# Patient Record
Sex: Female | Born: 1990 | Race: Black or African American | Hispanic: No | Smoking: Current some day smoker
Health system: Southern US, Community
[De-identification: ages and names within clinical notes are randomized; demographics above are authoritative.]

## PROBLEM LIST (undated history)

## (undated) DIAGNOSIS — J45909 Unspecified asthma, uncomplicated: Secondary | ICD-10-CM

## (undated) DIAGNOSIS — N12 Tubulo-interstitial nephritis, not specified as acute or chronic: Secondary | ICD-10-CM

---
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
## 2013-12-24 ENCOUNTER — Encounter (HOSPITAL_COMMUNITY): Payer: Self-pay | Admitting: Emergency Medicine

## 2013-12-24 ENCOUNTER — Inpatient Hospital Stay (HOSPITAL_COMMUNITY)
Admission: EM | Admit: 2013-12-24 | Discharge: 2013-12-29 | DRG: 872 | Disposition: A | Discharge status: Home / Self Care | Payer: BC Managed Care – PPO | Attending: Internal Medicine | Admitting: Internal Medicine

## 2013-12-24 ENCOUNTER — Emergency Department (HOSPITAL_COMMUNITY): Payer: BC Managed Care – PPO

## 2013-12-24 DIAGNOSIS — E876 Hypokalemia: Secondary | ICD-10-CM

## 2013-12-24 DIAGNOSIS — J45909 Unspecified asthma, uncomplicated: Secondary | ICD-10-CM

## 2013-12-24 DIAGNOSIS — A498 Other bacterial infections of unspecified site: Secondary | ICD-10-CM | POA: Diagnosis present

## 2013-12-24 DIAGNOSIS — K5900 Constipation, unspecified: Secondary | ICD-10-CM

## 2013-12-24 DIAGNOSIS — J8410 Pulmonary fibrosis, unspecified: Secondary | ICD-10-CM | POA: Diagnosis present

## 2013-12-24 DIAGNOSIS — R509 Fever, unspecified: Secondary | ICD-10-CM

## 2013-12-24 DIAGNOSIS — E8779 Other fluid overload: Secondary | ICD-10-CM | POA: Diagnosis present

## 2013-12-24 DIAGNOSIS — N12 Tubulo-interstitial nephritis, not specified as acute or chronic: Secondary | ICD-10-CM

## 2013-12-24 DIAGNOSIS — A419 Sepsis, unspecified organism: Principal | ICD-10-CM

## 2013-12-24 DIAGNOSIS — IMO0002 Reserved for concepts with insufficient information to code with codable children: Secondary | ICD-10-CM | POA: Diagnosis present

## 2013-12-24 DIAGNOSIS — Z79899 Other long term (current) drug therapy: Secondary | ICD-10-CM

## 2013-12-24 DIAGNOSIS — J8489 Other specified interstitial pulmonary diseases: Secondary | ICD-10-CM

## 2013-12-24 DIAGNOSIS — F17200 Nicotine dependence, unspecified, uncomplicated: Secondary | ICD-10-CM | POA: Diagnosis present

## 2013-12-24 HISTORY — DX: Unspecified asthma, uncomplicated: J45.909

## 2013-12-24 LAB — CBC WITH DIFFERENTIAL/PLATELET
BASOS ABS: 0 10*3/uL (ref 0.0–0.1)
Basophils Relative: 0 % (ref 0–1)
EOS PCT: 0 % (ref 0–5)
Eosinophils Absolute: 0 10*3/uL (ref 0.0–0.7)
HCT: 38.1 % (ref 36.0–46.0)
Hemoglobin: 12.3 g/dL (ref 12.0–15.0)
Lymphocytes Relative: 5 % — ABNORMAL LOW (ref 12–46)
Lymphs Abs: 0.6 10*3/uL — ABNORMAL LOW (ref 0.7–4.0)
MCH: 28.5 pg (ref 26.0–34.0)
MCHC: 32.3 g/dL (ref 30.0–36.0)
MCV: 88.4 fL (ref 78.0–100.0)
Monocytes Absolute: 1.6 10*3/uL — ABNORMAL HIGH (ref 0.1–1.0)
Monocytes Relative: 13 % — ABNORMAL HIGH (ref 3–12)
Neutro Abs: 10.1 10*3/uL — ABNORMAL HIGH (ref 1.7–7.7)
Neutrophils Relative %: 82 % — ABNORMAL HIGH (ref 43–77)
PLATELETS: 288 10*3/uL (ref 150–400)
RBC: 4.31 MIL/uL (ref 3.87–5.11)
RDW: 14 % (ref 11.5–15.5)
WBC: 12.4 10*3/uL — AB (ref 4.0–10.5)

## 2013-12-24 LAB — URINALYSIS, ROUTINE W REFLEX MICROSCOPIC
Bilirubin Urine: NEGATIVE
Glucose, UA: NEGATIVE mg/dL
Ketones, ur: NEGATIVE mg/dL
Nitrite: POSITIVE — AB
Protein, ur: 100 mg/dL — AB
SPECIFIC GRAVITY, URINE: 1.021 (ref 1.005–1.030)
UROBILINOGEN UA: 1 mg/dL (ref 0.0–1.0)
pH: 6 (ref 5.0–8.0)

## 2013-12-24 LAB — COMPREHENSIVE METABOLIC PANEL
ALT: 15 U/L (ref 0–35)
AST: 17 U/L (ref 0–37)
Albumin: 3.2 g/dL — ABNORMAL LOW (ref 3.5–5.2)
Alkaline Phosphatase: 81 U/L (ref 39–117)
BUN: 11 mg/dL (ref 6–23)
CALCIUM: 8.6 mg/dL (ref 8.4–10.5)
CO2: 24 mEq/L (ref 19–32)
Chloride: 101 mEq/L (ref 96–112)
Creatinine, Ser: 0.99 mg/dL (ref 0.50–1.10)
GFR calc Af Amer: >90 mL/min (ref >90)
GFR calc non Af Amer: 80 mL/min — ABNORMAL LOW (ref >90)
Glucose, Bld: 120 mg/dL — ABNORMAL HIGH (ref 70–99)
Potassium: 3.9 mEq/L (ref 3.7–5.3)
SODIUM: 139 meq/L (ref 137–147)
TOTAL PROTEIN: 7.3 g/dL (ref 6.0–8.3)
Total Bilirubin: 1 mg/dL (ref 0.3–1.2)

## 2013-12-24 LAB — LIPASE, BLOOD: Lipase: 14 U/L (ref 11–59)

## 2013-12-24 LAB — URINE MICROSCOPIC-ADD ON

## 2013-12-24 LAB — WET PREP, GENITAL
Clue Cells Wet Prep HPF POC: NONE SEEN
Trich, Wet Prep: NONE SEEN
WBC WET PREP: NONE SEEN
YEAST WET PREP: NONE SEEN

## 2013-12-24 LAB — POCT PREGNANCY, URINE: Preg Test, Ur: NEGATIVE

## 2013-12-24 LAB — CG4 I-STAT (LACTIC ACID): Lactic Acid, Venous: 1.34 mmol/L (ref 0.5–2.2)

## 2013-12-24 MED ORDER — SODIUM CHLORIDE 0.9 % IJ SOLN
3.0000 mL | Freq: Two times a day (BID) | INTRAMUSCULAR | Status: DC
  Administered 2013-12-24 – 2013-12-29 (×3): 3 mL via INTRAVENOUS

## 2013-12-24 MED ORDER — ONDANSETRON HCL 4 MG PO TABS: 4.0000 mg | ORAL_TABLET | Freq: Four times a day (QID) | ORAL | Status: DC | PRN

## 2013-12-24 MED ORDER — SODIUM CHLORIDE 0.9 % IV SOLN
INTRAVENOUS | Status: DC
  Administered 2013-12-24 – 2013-12-28 (×4): via INTRAVENOUS

## 2013-12-24 MED ORDER — OSELTAMIVIR PHOSPHATE 75 MG PO CAPS
75.0000 mg | ORAL_CAPSULE | Freq: Two times a day (BID) | ORAL | Status: DC
  Administered 2013-12-24 – 2013-12-25 (×2): 75 mg via ORAL
  Filled 2013-12-24 (×3): qty 1

## 2013-12-24 MED ORDER — ACETAMINOPHEN 325 MG PO TABS
650.0000 mg | ORAL_TABLET | Freq: Once | ORAL | Status: AC
  Administered 2013-12-24: 650 mg via ORAL
  Filled 2013-12-24: qty 2

## 2013-12-24 MED ORDER — ENOXAPARIN SODIUM 80 MG/0.8ML ~~LOC~~ SOLN
75.0000 mg | Freq: Every day | SUBCUTANEOUS | Status: DC
  Administered 2013-12-24: 75 mg via SUBCUTANEOUS
  Filled 2013-12-24 (×3): qty 0.8

## 2013-12-24 MED ORDER — IOHEXOL 300 MG/ML  SOLN
100.0000 mL | Freq: Once | INTRAMUSCULAR | Status: AC | PRN
Start: 2013-12-24 — End: 2013-12-24
  Administered 2013-12-24: 100 mL via INTRAVENOUS

## 2013-12-24 MED ORDER — MORPHINE SULFATE 4 MG/ML IJ SOLN
4.0000 mg | Freq: Once | INTRAMUSCULAR | Status: AC
  Administered 2013-12-24: 4 mg via INTRAVENOUS
  Filled 2013-12-24: qty 1

## 2013-12-24 MED ORDER — CEFTRIAXONE SODIUM 1 G IJ SOLR
1.0000 g | INTRAMUSCULAR | Status: DC
  Filled 2013-12-24: qty 10

## 2013-12-24 MED ORDER — SODIUM CHLORIDE 0.9 % IV BOLUS (SEPSIS)
1000.0000 mL | Freq: Once | INTRAVENOUS | Status: AC
  Administered 2013-12-24: 1000 mL via INTRAVENOUS

## 2013-12-24 MED ORDER — IBUPROFEN 800 MG PO TABS
800.0000 mg | ORAL_TABLET | Freq: Once | ORAL | Status: AC
  Administered 2013-12-24: 800 mg via ORAL
  Filled 2013-12-24: qty 1

## 2013-12-24 MED ORDER — IPRATROPIUM-ALBUTEROL 0.5-2.5 (3) MG/3ML IN SOLN
3.0000 mL | Freq: Once | RESPIRATORY_TRACT | Status: AC
  Administered 2013-12-24: 3 mL via RESPIRATORY_TRACT
  Filled 2013-12-24: qty 3

## 2013-12-24 MED ORDER — DEXTROSE 5 % IV SOLN
1.0000 g | Freq: Once | INTRAVENOUS | Status: AC
  Administered 2013-12-24: 1 g via INTRAVENOUS
  Filled 2013-12-24: qty 10

## 2013-12-24 MED ORDER — ALBUTEROL SULFATE (2.5 MG/3ML) 0.083% IN NEBU
2.5000 mg | INHALATION_SOLUTION | RESPIRATORY_TRACT | Status: DC | PRN
  Administered 2013-12-24: 2.5 mg via RESPIRATORY_TRACT
  Filled 2013-12-24: qty 3

## 2013-12-24 MED ORDER — OXYCODONE HCL 5 MG PO TABS
5.0000 mg | ORAL_TABLET | ORAL | Status: DC | PRN
  Administered 2013-12-26 – 2013-12-28 (×3): 5 mg via ORAL
  Filled 2013-12-24 (×3): qty 1

## 2013-12-24 MED ORDER — IOHEXOL 300 MG/ML  SOLN
25.0000 mL | Freq: Once | INTRAMUSCULAR | Status: AC | PRN
  Administered 2013-12-24: 25 mL via ORAL

## 2013-12-24 MED ORDER — ONDANSETRON HCL 4 MG/2ML IJ SOLN
4.0000 mg | Freq: Once | INTRAMUSCULAR | Status: AC
  Administered 2013-12-24: 4 mg via INTRAVENOUS
  Filled 2013-12-24: qty 2

## 2013-12-24 MED ORDER — ONDANSETRON HCL 4 MG/2ML IJ SOLN
4.0000 mg | Freq: Four times a day (QID) | INTRAMUSCULAR | Status: DC | PRN
  Administered 2013-12-24 – 2013-12-27 (×6): 4 mg via INTRAVENOUS
  Filled 2013-12-24 (×6): qty 2

## 2013-12-24 NOTE — H&P (Signed)
Date: 12/24/2013               Patient Name:  Natalie Ramsey MRN: [id number]  DOB: 08/19/1991 Age / Sex: 23 y.o., female   PCP: No primary provider on file.         Medical Service: Internal Medicine Teaching Service         Attending Physician: Dr. Jonah Blue, DO    First Contact: Daivd Council, MS IV Pager: [phone]  Second Contact: Dr. Desma Maxim, MD Pager: [phone]       After Hours (After 5p/  First Contact Pager: [phone]  weekends / holidays): Second Contact Pager: [phone]   Chief Complaint: Flank Pain  History of Present Illness: Natalie Ramsey is a 23 y.o. woman with pmhx of asthma who present with a cc of right sided flank pain. The patient was in her normal state of health until 4 days ago when she developed right sided flank pain. This pain is sharp. It does not radiate. She has associated symptoms for fevers, chills, nausea and vomiting since four days ago. She has not been able to keep food down during the last 3 days but has been able to drink water without problems. Today she began to feel woozy and dizzy while walking. The patient denies changes in bowel habits. She admits to burning with urination that has been present for years and has been unchanged recently. She denies vaginal discharge or abnormal bleeding. Her LMP was just before christmas. Her periods are sometimes irregular. She has had 1 sexual partner over the last 6 months. She has had 7 total sexual partners in her life. She reports that she is a lesbian and does not use contraception or birth control at this times. She admits to mild dyspareunia.    Of note, the patient developed SOB and cough over the last 24 hours. Her cough is productive of clear sputum. She denies sick contacts and body aches.   Meds: No current facility-administered medications for this encounter.   Current Outpatient Prescriptions  Medication Sig Dispense Refill  . albuterol (PROVENTIL HFA;VENTOLIN HFA) 108 (90 BASE) MCG/ACT  inhaler Inhale into the lungs every 6 (six) hours as needed for wheezing or shortness of breath.      . Pseudoeph-Doxylamine-DM-APAP (NYQUIL PO) Take 2 capsules by mouth every 4 (four) hours as needed.        Allergies: Allergies as of 12/24/2013  . (No Known Allergies)   Past Medical History  Diagnosis Date  . Asthma    History reviewed. No pertinent past surgical history. History reviewed. No pertinent family history. History   Social History  . Marital Status: Single    Spouse Name: N/A    Number of Children: N/A  . Years of Education: N/A   Occupational History  . Not on file.   Social History Main Topics  . Smoking status: Current Some Day Smoker  . Smokeless tobacco: Not on file  . Alcohol Use: No  . Drug Use: No  . Sexual Activity: Not on file   Other Topics Concern  . Not on file   Social History Narrative  . No narrative on file    Review of Systems: Pertinent items are noted in HPI.  Physical Exam: Blood pressure 120/63, pulse 68, temperature 98.9 F (37.2 C), temperature source Oral, resp. rate 20, last menstrual period 11/17/2013, SpO2 100.00%. Physical Exam  Constitutional: She is oriented to person, place, and time. She appears well-developed and well-nourished.  No distress.  HENT:  Head: Normocephalic.  Mouth/Throat: Oropharynx is clear and moist. No oropharyngeal exudate.  Cardiovascular: Normal rate, regular rhythm and intact distal pulses.  Exam reveals no gallop and no friction rub.   Murmur heard. Systolic crescendo decrescendo murmur 2/6 heard best in the LUSB.  Pulmonary/Chest: Effort normal. No respiratory distress. She has wheezes. She has no rales. She exhibits no tenderness.  Abdominal: Soft. Bowel sounds are normal. She exhibits no distension. There is tenderness. There is no rebound and no guarding.  Mild tenderness in right flank and RLQ  Genitourinary:  Pelvic performed by ED team.  Musculoskeletal: She exhibits no edema and no  tenderness.  Mild Right sided CVA tenderness.  Neurological: She is alert and oriented to person, place, and time.  Skin: She is not diaphoretic.  Psychiatric: She has a normal mood and affect. Her behavior is normal.     Lab results: Basic Metabolic Panel:  Recent Labs  47/82/9501/27/15 1200  NA 139  K 3.9  CL 101  CO2 24  GLUCOSE 120*  BUN 11  CREATININE 0.99  CALCIUM 8.6   AG: 14  Lactic Acid: 1.34  Liver Function Tests:  Recent Labs  12/24/13 1200  AST 17  ALT 15  ALKPHOS 81  BILITOT 1.0  PROT 7.3  ALBUMIN 3.2*    Recent Labs  12/24/13 1200  LIPASE 14   CBC:  Recent Labs  12/24/13 1200  WBC 12.4*  NEUTROABS 10.1*  HGB 12.3  HCT 38.1  MCV 88.4  PLT 288   Urinalysis:  Recent Labs  12/24/13 1510  COLORURINE AMBER*  LABSPEC 1.021  PHURINE 6.0  GLUCOSEU NEGATIVE  HGBUR LARGE*  BILIRUBINUR NEGATIVE  KETONESUR NEGATIVE  PROTEINUR 100*  UROBILINOGEN 1.0  NITRITE POSITIVE*  LEUKOCYTESUR MODERATE*   Other labs: Wet Prep: Negative GC: pending  Imaging results:  [place] Transvaginal Non-ob  12/24/2013   CLINICAL DATA:  Fever and right lower quadrant discomfort, possible tubo-ovarian abscess.  EXAM: TRANSABDOMINAL AND TRANSVAGINAL ULTRASOUND OF PELVIS  TECHNIQUE: Both transabdominal and transvaginal ultrasound examinations of the pelvis were performed. Transabdominal technique was performed for global imaging of the pelvis including uterus, ovaries, adnexal regions, and pelvic cul-de-sac. It was necessary to proceed with endovaginal exam following the transabdominal exam to visualize the endometrium and adnexal structures.  COMPARISON:  None  FINDINGS: Uterus  Measurements: 6.7 x 3.2 x 4.3 cm. No fibroids or other mass visualized.  Endometrium  Thickness: 4.2 mm.  No focal abnormality visualized.  Right ovary  Measurements: 3.8 x 2.6 x 1.8 cm. Normal appearance/no adnexal mass.  Left ovary  Measurements: 2.7 x 1.7 x 1.5 cm. Normal appearance/no adnexal mass.   Other findings  There is no free fluid or complex fluid collection in the pelvis. There is echogenic material within the urinary bladder.  IMPRESSION: 1. The uterus is normal in appearance. 2. The ovaries and adnexal structures exhibit no acute abnormalities. 3. Echogenic debris within the urinary bladder may be sterile cellular debris or could reflect cystitis or blood products. 4. If there are clinical concerns of possible acute appendicitis, CT scanning would be a useful next imaging step.   Electronically Signed   By: David  [place]   On: 12/24/2013 16:05   [place] Pelvis Complete  12/24/2013   CLINICAL DATA:  Fever and right lower quadrant discomfort, possible tubo-ovarian abscess.  EXAM: TRANSABDOMINAL AND TRANSVAGINAL ULTRASOUND OF PELVIS  TECHNIQUE: Both transabdominal and transvaginal ultrasound examinations of the pelvis were performed. Transabdominal technique  was performed for global imaging of the pelvis including uterus, ovaries, adnexal regions, and pelvic cul-de-sac. It was necessary to proceed with endovaginal exam following the transabdominal exam to visualize the endometrium and adnexal structures.  COMPARISON:  None  FINDINGS: Uterus  Measurements: 6.7 x 3.2 x 4.3 cm. No fibroids or other mass visualized.  Endometrium  Thickness: 4.2 mm.  No focal abnormality visualized.  Right ovary  Measurements: 3.8 x 2.6 x 1.8 cm. Normal appearance/no adnexal mass.  Left ovary  Measurements: 2.7 x 1.7 x 1.5 cm. Normal appearance/no adnexal mass.  Other findings  There is no free fluid or complex fluid collection in the pelvis. There is echogenic material within the urinary bladder.  IMPRESSION: 1. The uterus is normal in appearance. 2. The ovaries and adnexal structures exhibit no acute abnormalities. 3. Echogenic debris within the urinary bladder may be sterile cellular debris or could reflect cystitis or blood products. 4. If there are clinical concerns of possible acute appendicitis, CT scanning would be a  useful next imaging step.   Electronically Signed   By: David  [place]   On: 12/24/2013 16:05   Ct Abdomen Pelvis W Contrast  12/24/2013   CLINICAL DATA:  Right lower quadrant pain with nausea and vomiting for 4 days.  EXAM: CT ABDOMEN AND PELVIS WITH CONTRAST  TECHNIQUE: Multidetector CT imaging of the abdomen and pelvis was performed using the standard protocol following bolus administration of intravenous contrast.  CONTRAST:  OMNIPAQUE IOHEXOL 300 MG/ML  SOLN  COMPARISON:  None.  FINDINGS: Lung Bases: Dependent atelectasis is present at at both lung bases.  Liver:  Normal.  Spleen:  Normal.  Gallbladder:  Normal.  Common bile duct:  Normal.  Pancreas:  Normal.  Adrenal glands:  Normal.  Kidneys: The left kidney and ureter appear normal. The right kidney demonstrates decreased enhancement in the lower pole band upper to interpolar region compatible with pyelonephritis. Correlation with urinalysis is recommended. This is best appreciated on the sagittal and coronal reconstructed images.  Stomach:  Normal.  Small bowel:  Normal.  Colon:   Normal appendix.  No inflammatory changes of colon.  Pelvic Genitourinary: Normal appearance of the uterus and adnexa. Urinary bladder is within normal limits.  Bones:  Normal.  Vasculature: Normal.  Body Wall: Normal.  IMPRESSION: Right pyelonephritis.   Electronically Signed   By: Andreas Newport M.D.   On: 12/24/2013 18:57   Dg Chest Port 1 View  (if Code Sepsis Called)  12/24/2013   CLINICAL DATA:  Fever.  EXAM: PORTABLE CHEST - 1 VIEW  COMPARISON:  09/10/2007.  FINDINGS: Stable cardiac and mediastinal contours, enlarged, given differences in technique. Low lung volumes. No consolidative pulmonary opacities. Pulmonary vascular redistribution. No pleural effusion pneumothorax.  IMPRESSION: Mild cardiomegaly.  Pulmonary vascular redistribution.   Electronically Signed   By: Annia Belt M.D.   On: 12/24/2013 12:12    Other results: EKG: not available  Assessment  & Plan by Problem: Principal Problem:   Sepsis Active Problems:   Pyelonephritis   Asthma, chronic  Sepsis 2/2 Pyelonephritis The patients flank pain complicated by /V is likely due to pyelonephritis complicated by sepsis. SIRS criteria are  fever, WBC, tachycardia, tachypnea). PID is unlikely given no cervical motion tenderness. Appendicitis unlikely given normal appendix on CT. The patients BP responded well to 2 L NS bolus in ED.   - Blood Cultures and urine culture pending - Ceftriaxone IV started - Zofran 4 mg IV q 6 prn -  IVFs at 75 cc/hr - F/U GC/Chlamydia, start azithro if positive  Cough The patients cough is likely due to asthma as she has history of asthma and there is wheezing bil on physical exam. However, flu is possible given fever and productive cough. PNA less likely given clear CXR. CXR revealed mild cardiomegaly (may be due to AP portable technique) and a heart murmur was appreciated. Thus, echocardiogram may be considered.  - F/U EKG - F/U Repeat CXR, may consider TTE in am if cardiomegaly on 2 view with PA and heart murmur is persistant. - F/U flu swab and start tamiflu if positive - Duonebs q 6 hr prn   DVT: Lovenox Diet: Regular  Dispo: Disposition is deferred at this time, awaiting improvement of current medical problems. Anticipated discharge in approximately 1-2 day(s).   The patient does not have a current PCP (No primary provider on file.) and does need an [facility] hospital follow-up appointment after discharge.  The patient does not have transportation limitations that hinder transportation to clinic appointments.  Signed: Pleas Koch, MD 12/24/2013, 9:34 PM

## 2013-12-24 NOTE — ED Notes (Signed)
Internal medicine at bedside

## 2013-12-24 NOTE — ED Notes (Signed)
Spoke with Selena BattenKim in main lab, states she will add lipase onto blood work

## 2013-12-24 NOTE — ED Notes (Signed)
Pt c/o fever, chills, nausea, vomiting, abd cramping, constipation, and unable to eat or keep food down x4 days

## 2013-12-24 NOTE — ED Notes (Signed)
Pt finished drinking oral CT contrast. Will notify CT.

## 2013-12-24 NOTE — ED Notes (Signed)
Called Ct

## 2013-12-24 NOTE — Progress Notes (Signed)
ANTIBIOTIC CONSULT NOTE - INITIAL  Pharmacy Consult for ceftriaxone Indication: pyelonephritis  No Known Allergies  Patient Measurements: Height: 5\' 11"  (180.3 cm) Weight: 324 lb 1.2 oz (147 kg) IBW/kg (Calculated) : 70.8 Adjusted Body Weight:   Vital Signs: Temp: 100.1 F (37.8 C) (01/27 2220) Temp src: Oral (01/27 2220) BP: 132/56 mmHg (01/27 2220) Pulse Rate: 71 (01/27 2220) Intake/Output from previous day:   Intake/Output from this shift:    Labs:  Recent Labs  12/24/13 1200  WBC 12.4*  HGB 12.3  PLT 288  CREATININE 0.99   Estimated Creatinine Clearance: 141.3 ml/min (by C-G formula based on Cr of 0.99). No results found for this basename: VANCOTROUGH, VANCOPEAK, VANCORANDOM, GENTTROUGH, GENTPEAK, GENTRANDOM, TOBRATROUGH, TOBRAPEAK, TOBRARND, AMIKACINPEAK, AMIKACINTROU, AMIKACIN,  in the last 72 hours   Microbiology: Recent Results (from the past 720 hour(s))  WET PREP, GENITAL     Status: None   Collection Time    12/24/13  3:08 PM      Result Value Range Status   Yeast Wet Prep HPF POC NONE SEEN  NONE SEEN Final   Trich, Wet Prep NONE SEEN  NONE SEEN Final   Clue Cells Wet Prep HPF POC NONE SEEN  NONE SEEN Final   WBC, Wet Prep HPF POC NONE SEEN  NONE SEEN Final    Medical History: Past Medical History  Diagnosis Date  . Asthma     Medications:  Scheduled:  . enoxaparin (LOVENOX) injection  75 mg Subcutaneous QHS  . oseltamivir  75 mg Oral BID  . sodium chloride  3 mL Intravenous Q12H   Infusions:  . sodium chloride     Assessment: 23 yo female with pyelonephritis will be continued on ceftriaxone.  Patient received 1g of ceftriaxone at 1809 on 12/24/13  Goal of Therapy:  Resolution of infection  Plan:  1) Continue ceftriaxone 1g iv q24h, next dose at 1800 on 12/25/13.  Pharmacy will sign off.  Ammar Moffatt, Tsz-Yin 12/24/2013,10:24 PM

## 2013-12-24 NOTE — ED Provider Notes (Signed)
CSN: [id number]     Arrival date & time 12/24/13  1058 History   First MD Initiated Contact with Patient 12/24/13 1150     Chief Complaint  Patient presents with  . Fever    HPI  Natalie Ramsey is a 23 y.o. female with a PMH of asthma who presents to the ED for evaluation of fever.  History was provided by the patient.  Patient has had abdominal pain for the past 4 days.  Her pain is located in the RLQ without radiation.  Her pain is constant and worse with movement. Her pain is described as an aching and sharp sensation. She also reports chills, fever, generalized headache, lightheadedness, nausea, vomiting x 4 days (two times today), constipation (last BM 4 days ago), fatigue, foul urine odor, diffuse lower back pain, and generalized weakness.  She has had a productive cough with no hemoptysis.  She has a hx of asthma and is almost out of her inhaler.  She has had mild wheezing with no SOB or chest pain.  She is currently sexually active with no new partners.  No recent travel.  No known sick contacts.  Patient did not receive the flu shot this year.  She denies any rhinorrhea, sore throat, ear pain, diarrhea, leg edema.     Past Medical History  Diagnosis Date  . Asthma    History reviewed. No pertinent past surgical history. History reviewed. No pertinent family history. History  Substance Use Topics  . Smoking status: Current Some Day Smoker  . Smokeless tobacco: Not on file  . Alcohol Use: No   OB History   Grav Para Term Preterm Abortions TAB SAB Ect Mult Living                 Review of Systems  Constitutional: Positive for fever, chills, diaphoresis, activity change, appetite change and fatigue.  HENT: Negative for congestion, rhinorrhea and sore throat.   Eyes: Negative for photophobia and visual disturbance.  Respiratory: Positive for cough and wheezing. Negative for chest tightness and shortness of breath.   Cardiovascular: Negative for chest pain and leg swelling.   Gastrointestinal: Positive for nausea, vomiting, abdominal pain and constipation. Negative for diarrhea and blood in stool.  Genitourinary: Negative for dysuria, hematuria, flank pain, decreased urine volume, vaginal bleeding, vaginal discharge, difficulty urinating and vaginal pain.  Musculoskeletal: Positive for back pain and myalgias. Negative for gait problem and neck pain.  Skin: Negative for rash and wound.  Neurological: Positive for weakness (generalized), light-headedness and headaches. Negative for dizziness, syncope and numbness.    Allergies  Review of patient's allergies indicates no known allergies.  Home Medications   Current Outpatient Rx  Name  Route  Sig  Dispense  Refill  . albuterol (PROVENTIL HFA;VENTOLIN HFA) 108 (90 BASE) MCG/ACT inhaler   Inhalation   Inhale into the lungs every 6 (six) hours as needed for wheezing or shortness of breath.         . Pseudoeph-Doxylamine-DM-APAP (NYQUIL PO)   Oral   Take 2 capsules by mouth every 4 (four) hours as needed.          BP 93/59  Pulse 104  Temp(Src) 103.1 F (39.5 C) (Oral)  Resp 20  SpO2 98%  LMP 11/17/2013  Filed Vitals:   12/24/13 1345 12/24/13 1430 12/24/13 1445 12/24/13 1635  BP: 117/64 127/68 129/63 146/75  Pulse: 93 81 84 97  Temp:  101.3 F (38.5 C)  103.2 F (39.6 C)  TempSrc:  Oral  Oral  Resp:  18  19  SpO2: 96% 100% 97% 100%   Physical Exam  Nursing note and vitals reviewed. Constitutional: She is oriented to person, place, and time. She appears well-developed and well-nourished. No distress.  HENT:  Head: Normocephalic and atraumatic.  Right Ear: External ear normal.  Left Ear: External ear normal.  Nose: Nose normal.  Mouth/Throat: Oropharynx is clear and moist. No oropharyngeal exudate.  No erythema to the posterior pharynx.  Uvula midline.  No trismus.  Tympanic membranes gray and translucent bilaterally with no erythema, edema, or hemotympanum.    Eyes: Conjunctivae and EOM  are normal. Pupils are equal, round, and reactive to light. Right eye exhibits no discharge. Left eye exhibits no discharge.  Neck: Normal range of motion. Neck supple.  No cervical spinal or paraspinal tenderness to palpation throughout.  No limitations with neck ROM.    Cardiovascular: Normal rate, regular rhythm, normal heart sounds and intact distal pulses.  Exam reveals no gallop and no friction rub.   No murmur heard. Pulmonary/Chest: Effort normal. No respiratory distress. She has wheezes. She has no rales. She exhibits no tenderness.  Mild intermittent inspiratory and expiratory wheezing  Abdominal: Soft. Bowel sounds are normal. She exhibits no distension and no mass. There is tenderness. There is no rebound and no guarding.  RLQ tenderness.  Negative Rovsing's.      Musculoskeletal: Normal range of motion. She exhibits no edema and no tenderness.  No pedal edema or calf tenderness bilaterally.  Patient able to ambulate without difficulty or ataxia  Neurological: She is alert and oriented to person, place, and time.  Skin: Skin is warm and dry. No rash noted. She is not diaphoretic.  Warm to the touch    ED Course  Procedures (including critical care time) Labs Review Labs Reviewed  CBC WITH DIFFERENTIAL - Abnormal; Notable for the following:    WBC 12.4 (*)    Neutrophils Relative % 82 (*)    Neutro Abs 10.1 (*)    Lymphocytes Relative 5 (*)    Lymphs Abs 0.6 (*)    Monocytes Relative 13 (*)    Monocytes Absolute 1.6 (*)    All other components within normal limits  PREGNANCY, URINE  COMPREHENSIVE METABOLIC PANEL  CG4 I-STAT (LACTIC ACID)   Imaging Review Dg Chest Port 1 View  (if Code Sepsis Called)  12/24/2013   CLINICAL DATA:  Fever.  EXAM: PORTABLE CHEST - 1 VIEW  COMPARISON:  09/10/2007.  FINDINGS: Stable cardiac and mediastinal contours, enlarged, given differences in technique. Low lung volumes. No consolidative pulmonary opacities. Pulmonary vascular  redistribution. No pleural effusion pneumothorax.  IMPRESSION: Mild cardiomegaly.  Pulmonary vascular redistribution.   Electronically Signed   By: Annia Belt M.D.   On: 12/24/2013 12:12    EKG Interpretation   None      Results for orders placed during the hospital encounter of 12/24/13  WET PREP, GENITAL      Result Value Range   Yeast Wet Prep HPF POC NONE SEEN  NONE SEEN   Trich, Wet Prep NONE SEEN  NONE SEEN   Clue Cells Wet Prep HPF POC NONE SEEN  NONE SEEN   WBC, Wet Prep HPF POC NONE SEEN  NONE SEEN  CBC WITH DIFFERENTIAL      Result Value Range   WBC 12.4 (*) 4.0 - 10.5 K/uL   RBC 4.31  3.87 - 5.11 MIL/uL   Hemoglobin 12.3  12.0 -  15.0 g/dL   HCT 45.4  09.8 - 11.9 %   MCV 88.4  78.0 - 100.0 fL   MCH 28.5  26.0 - 34.0 pg   MCHC 32.3  30.0 - 36.0 g/dL   RDW 14.7  82.9 - 56.2 %   Platelets 288  150 - 400 K/uL   Neutrophils Relative % 82 (*) 43 - 77 %   Neutro Abs 10.1 (*) 1.7 - 7.7 K/uL   Lymphocytes Relative 5 (*) 12 - 46 %   Lymphs Abs 0.6 (*) 0.7 - 4.0 K/uL   Monocytes Relative 13 (*) 3 - 12 %   Monocytes Absolute 1.6 (*) 0.1 - 1.0 K/uL   Eosinophils Relative 0  0 - 5 %   Eosinophils Absolute 0.0  0.0 - 0.7 K/uL   Basophils Relative 0  0 - 1 %   Basophils Absolute 0.0  0.0 - 0.1 K/uL  COMPREHENSIVE METABOLIC PANEL      Result Value Range   Sodium 139  137 - 147 mEq/L   Potassium 3.9  3.7 - 5.3 mEq/L   Chloride 101  96 - 112 mEq/L   CO2 24  19 - 32 mEq/L   Glucose, Bld 120 (*) 70 - 99 mg/dL   BUN 11  6 - 23 mg/dL   Creatinine, Ser 1.30  0.50 - 1.10 mg/dL   Calcium 8.6  8.4 - 86.5 mg/dL   Total Protein 7.3  6.0 - 8.3 g/dL   Albumin 3.2 (*) 3.5 - 5.2 g/dL   AST 17  0 - 37 U/L   ALT 15  0 - 35 U/L   Alkaline Phosphatase 81  39 - 117 U/L   Total Bilirubin 1.0  0.3 - 1.2 mg/dL   GFR calc non Af Amer 80 (*) >90 mL/min   GFR calc Af Amer >90  >90 mL/min  LIPASE, BLOOD      Result Value Range   Lipase 14  11 - 59 U/L  URINALYSIS, ROUTINE W REFLEX MICROSCOPIC       Result Value Range   Color, Urine AMBER (*) YELLOW   APPearance CLOUDY (*) CLEAR   Specific Gravity, Urine 1.021  1.005 - 1.030   pH 6.0  5.0 - 8.0   Glucose, UA NEGATIVE  NEGATIVE mg/dL   Hgb urine dipstick LARGE (*) NEGATIVE   Bilirubin Urine NEGATIVE  NEGATIVE   Ketones, ur NEGATIVE  NEGATIVE mg/dL   Protein, ur 784 (*) NEGATIVE mg/dL   Urobilinogen, UA 1.0  0.0 - 1.0 mg/dL   Nitrite POSITIVE (*) NEGATIVE   Leukocytes, UA MODERATE (*) NEGATIVE  URINE MICROSCOPIC-ADD ON      Result Value Range   Squamous Epithelial / LPF FEW (*) RARE   WBC, UA 21-50  <3 WBC/hpf   RBC / HPF 0-2  <3 RBC/hpf   Bacteria, UA MANY (*) RARE  CG4 I-STAT (LACTIC ACID)      Result Value Range   Lactic Acid, Venous 1.34  0.5 - 2.2 mmol/L  POCT PREGNANCY, URINE      Result Value Range   Preg Test, Ur NEGATIVE  NEGATIVE    DG Chest Port 1 View (if Code Sepsis called) (Final result)  Result time: 12/24/13 12:12:41    Final result by Rad Results In Interface (12/24/13 12:12:41)    Narrative:   CLINICAL DATA: Fever.  EXAM: PORTABLE CHEST - 1 VIEW  COMPARISON: 09/10/2007.  FINDINGS: Stable cardiac and mediastinal contours, enlarged, given differences in technique.  Low lung volumes. No consolidative pulmonary opacities. Pulmonary vascular redistribution. No pleural effusion pneumothorax.  IMPRESSION: Mild cardiomegaly. Pulmonary vascular redistribution.   Electronically Signed By: Annia Belt M.D. On: 12/24/2013 12:12             US Transvaginal Non-OB (Final result)  Result time: 12/24/13 16:05:10    Final result by Rad Results In Interface (12/24/13 16:05:10)    Narrative:   CLINICAL DATA: Fever and right lower quadrant discomfort, possible tubo-ovarian abscess.  EXAM: TRANSABDOMINAL AND TRANSVAGINAL ULTRASOUND OF PELVIS  TECHNIQUE: Both transabdominal and transvaginal ultrasound examinations of the pelvis were performed. Transabdominal technique was performed for global  imaging of the pelvis including uterus, ovaries, adnexal regions, and pelvic cul-de-sac. It was necessary to proceed with endovaginal exam following the transabdominal exam to visualize the endometrium and adnexal structures.  COMPARISON: None  FINDINGS: Uterus  Measurements: 6.7 x 3.2 x 4.3 cm. No fibroids or other mass visualized.  Endometrium  Thickness: 4.2 mm. No focal abnormality visualized.  Right ovary  Measurements: 3.8 x 2.6 x 1.8 cm. Normal appearance/no adnexal mass.  Left ovary  Measurements: 2.7 x 1.7 x 1.5 cm. Normal appearance/no adnexal mass.  Other findings  There is no free fluid or complex fluid collection in the pelvis. There is echogenic material within the urinary bladder.  IMPRESSION: 1. The uterus is normal in appearance. 2. The ovaries and adnexal structures exhibit no acute abnormalities. 3. Echogenic debris within the urinary bladder may be sterile cellular debris or could reflect cystitis or blood products. 4. If there are clinical concerns of possible acute appendicitis, CT scanning would be a useful next imaging step.   Electronically Signed By: David [place] On: 12/24/2013 16:05             CT Abdomen Pelvis W Contrast (Final result)  Result time: 12/24/13 18:57:44    Final result by Rad Results In Interface (12/24/13 18:57:44)    Narrative:   CLINICAL DATA: Right lower quadrant pain with nausea and vomiting for 4 days.  EXAM: CT ABDOMEN AND PELVIS WITH CONTRAST  TECHNIQUE: Multidetector CT imaging of the abdomen and pelvis was performed using the standard protocol following bolus administration of intravenous contrast.  CONTRAST: OMNIPAQUE IOHEXOL 300 MG/ML SOLN  COMPARISON: None.  FINDINGS: Lung Bases: Dependent atelectasis is present at at both lung bases.  Liver: Normal.  Spleen: Normal.  Gallbladder: Normal.  Common bile duct: Normal.  Pancreas: Normal.  Adrenal glands: Normal.  Kidneys: The  left kidney and ureter appear normal. The right kidney demonstrates decreased enhancement in the lower pole band upper to interpolar region compatible with pyelonephritis. Correlation with urinalysis is recommended. This is best appreciated on the sagittal and coronal reconstructed images.  Stomach: Normal.  Small bowel: Normal.  Colon: Normal appendix. No inflammatory changes of colon.  Pelvic Genitourinary: Normal appearance of the uterus and adnexa. Urinary bladder is within normal limits.  Bones: Normal.  Vasculature: Normal.  Body Wall: Normal.  IMPRESSION: Right pyelonephritis.   Electronically Signed By: Andreas Newport M.D. On: 12/24/2013 18:57         MDM   Natalie Ramsey is a 23 y.o. female with a PMH of asthma who presents to the ED for evaluation of fever.   Rechecks  2:00 PM = Patient states she feels much better. Repeat abdominal exam reveals continued RLQ tenderness. Lungs clear to auscultation.   3:15 PM = Pelvic exam performed at bedside.  Malodorous.  Moderate amount of thick white discharge.  Cervix not fully visualized due to anatomy.  No CMT.  Tenderness to palpation to the right adnexa.  No left adnexal tenderness.  Ordering Pelvic US.  Temp elevated.  Ordering Ibuprofen.   4:30 PM = Pain worsening.  4 mg morphine ordered.  1L NS and Tylenol re-ordered.  Fever 103.  Ordering blood cultures.  1 gram Rocephin for UTI.     7:00 PM = patient states her pain is a 6/10. Spoke with patient regarding results and she agrees to admission  Consults  7:45 PM = Spoke with hospitalist who agrees to admission.  Coming down to evaluate patient.  No temporary admit orders at this time.     Etiology of RLQ abdominal pain and fever likely due to to a right pyelonephritis. Patient was started on IV Rocephin after blood cultures were taken. Patient had a fever with a max temp of 103 in the emergency department. Her lactic acid was within normal limits. Her blood  pressure remained stable. Patient needs sepsis criteria with elevated white blood cell count (12.4), mild tachycardia, and fever with source of infection. No evidence of severe sepsis or septic shock.  No significant tenderness on pelvic exam.  Wet mount unremarkable.  Gonorrhea and chlamydia testing pending.  Low suspicion for PID.  Patient admitted for further evaluation and management.  Patient in agreement with admission and plan.    Final impressions: 1. Pyelonephritis   2. Sepsis       Greer EeJessica Katlin Beula Joyner PA-C         Jillyn LedgerJessica K Deyton Ellenbecker, PA-C 12/24/13 2057

## 2013-12-24 NOTE — ED Notes (Signed)
Lactic acid results called to primary nurse Duwayne Heckanielle

## 2013-12-24 NOTE — ED Notes (Signed)
Pt states unable to use the bathroom at this time, will try again later

## 2013-12-25 ENCOUNTER — Inpatient Hospital Stay (HOSPITAL_COMMUNITY): Payer: BC Managed Care – PPO

## 2013-12-25 DIAGNOSIS — J8489 Other specified interstitial pulmonary diseases: Secondary | ICD-10-CM | POA: Diagnosis present

## 2013-12-25 DIAGNOSIS — K5900 Constipation, unspecified: Secondary | ICD-10-CM | POA: Diagnosis present

## 2013-12-25 DIAGNOSIS — E876 Hypokalemia: Secondary | ICD-10-CM | POA: Diagnosis present

## 2013-12-25 DIAGNOSIS — I517 Cardiomegaly: Secondary | ICD-10-CM

## 2013-12-25 HISTORY — DX: Other specified interstitial pulmonary diseases: J84.89

## 2013-12-25 LAB — URINE CULTURE: Colony Count: >=100000

## 2013-12-25 LAB — BASIC METABOLIC PANEL
BUN: 11 mg/dL (ref 6–23)
CALCIUM: 7.4 mg/dL — AB (ref 8.4–10.5)
CO2: 21 mEq/L (ref 19–32)
Chloride: 104 mEq/L (ref 96–112)
Creatinine, Ser: 1.1 mg/dL (ref 0.50–1.10)
GFR calc Af Amer: 81 mL/min — ABNORMAL LOW (ref >90)
GFR, EST NON AFRICAN AMERICAN: 70 mL/min — AB (ref >90)
GLUCOSE: 151 mg/dL — AB (ref 70–99)
POTASSIUM: 3.6 meq/L — AB (ref 3.7–5.3)
Sodium: 140 mEq/L (ref 137–147)

## 2013-12-25 LAB — CBC
HCT: 32.7 % — ABNORMAL LOW (ref 36.0–46.0)
HEMOGLOBIN: 10.6 g/dL — AB (ref 12.0–15.0)
MCH: 28.6 pg (ref 26.0–34.0)
MCHC: 32.4 g/dL (ref 30.0–36.0)
MCV: 88.4 fL (ref 78.0–100.0)
Platelets: 225 10*3/uL (ref 150–400)
RBC: 3.7 MIL/uL — ABNORMAL LOW (ref 3.87–5.11)
RDW: 14.1 % (ref 11.5–15.5)
WBC: 10.6 10*3/uL — ABNORMAL HIGH (ref 4.0–10.5)

## 2013-12-25 LAB — GC/CHLAMYDIA PROBE AMP
CT PROBE, AMP APTIMA: NEGATIVE
GC Probe RNA: NEGATIVE

## 2013-12-25 LAB — INFLUENZA PANEL BY PCR (TYPE A & B)
H1N1 flu by pcr: NOT DETECTED
INFLAPCR: NEGATIVE
Influenza B By PCR: NEGATIVE

## 2013-12-25 MED ORDER — POTASSIUM CHLORIDE CRYS ER 20 MEQ PO TBCR
20.0000 meq | EXTENDED_RELEASE_TABLET | Freq: Once | ORAL | Status: AC
  Administered 2013-12-25: 20 meq via ORAL
  Filled 2013-12-25: qty 1

## 2013-12-25 MED ORDER — INFLUENZA VAC SPLIT QUAD 0.5 ML IM SUSP
0.5000 mL | INTRAMUSCULAR | Status: DC
  Filled 2013-12-25: qty 0.5

## 2013-12-25 MED ORDER — SODIUM CHLORIDE 0.9 % IV BOLUS (SEPSIS)
500.0000 mL | Freq: Once | INTRAVENOUS | Status: AC
  Administered 2013-12-25: 500 mL via INTRAVENOUS

## 2013-12-25 MED ORDER — VANCOMYCIN HCL 10 G IV SOLR
2000.0000 mg | Freq: Once | INTRAVENOUS | Status: AC
  Administered 2013-12-25: 2000 mg via INTRAVENOUS
  Filled 2013-12-25: qty 2000

## 2013-12-25 MED ORDER — DOCUSATE SODIUM 100 MG PO CAPS
100.0000 mg | ORAL_CAPSULE | Freq: Two times a day (BID) | ORAL | Status: DC
Start: 2013-12-25 — End: 2013-12-29
  Administered 2013-12-25 – 2013-12-29 (×7): 100 mg via ORAL
  Filled 2013-12-25 (×10): qty 1

## 2013-12-25 MED ORDER — POLYETHYLENE GLYCOL 3350 17 G PO PACK
17.0000 g | PACK | Freq: Two times a day (BID) | ORAL | Status: DC
  Administered 2013-12-25 – 2013-12-29 (×7): 17 g via ORAL
  Filled 2013-12-25 (×10): qty 1

## 2013-12-25 MED ORDER — VANCOMYCIN HCL 10 G IV SOLR
1500.0000 mg | Freq: Two times a day (BID) | INTRAVENOUS | Status: DC
  Administered 2013-12-26: 1500 mg via INTRAVENOUS
  Filled 2013-12-25 (×2): qty 1500

## 2013-12-25 MED ORDER — ACETAMINOPHEN 325 MG PO TABS
650.0000 mg | ORAL_TABLET | Freq: Four times a day (QID) | ORAL | Status: DC | PRN
  Administered 2013-12-25 – 2013-12-26 (×2): 650 mg via ORAL
  Filled 2013-12-25 (×2): qty 2

## 2013-12-25 MED ORDER — MORPHINE SULFATE 2 MG/ML IJ SOLN: 2.0000 mg | Freq: Four times a day (QID) | INTRAMUSCULAR | Status: DC | PRN

## 2013-12-25 MED ORDER — ENOXAPARIN SODIUM 80 MG/0.8ML ~~LOC~~ SOLN
70.0000 mg | Freq: Every day | SUBCUTANEOUS | Status: DC
  Administered 2013-12-25 – 2013-12-26 (×2): 70 mg via SUBCUTANEOUS
  Filled 2013-12-25 (×3): qty 0.8

## 2013-12-25 MED ORDER — ACETAMINOPHEN 650 MG RE SUPP
650.0000 mg | Freq: Once | RECTAL | Status: AC
  Administered 2013-12-25: 650 mg via RECTAL
  Filled 2013-12-25: qty 1

## 2013-12-25 MED ORDER — PNEUMOCOCCAL VAC POLYVALENT 25 MCG/0.5ML IJ INJ
0.5000 mL | INJECTION | INTRAMUSCULAR | Status: DC
  Filled 2013-12-25: qty 0.5

## 2013-12-25 MED ORDER — PIPERACILLIN-TAZOBACTAM 3.375 G IVPB
3.3750 g | Freq: Three times a day (TID) | INTRAVENOUS | Status: DC
Start: 2013-12-25 — End: 2013-12-26
  Administered 2013-12-25 – 2013-12-26 (×2): 3.375 g via INTRAVENOUS
  Filled 2013-12-25 (×4): qty 50

## 2013-12-25 NOTE — Progress Notes (Signed)
Subjective and key labs:  -Patient has fever and chills, abdominal pain and burning sensation on urination. She also reports having nasal congestion and constipation today -GC/chalamydia: negative - WBC 12.4-->10.6 -Blood culture: no growth so far -Ux positive for Escherichia coli.   Objective: Vital signs in last 24 hours: Filed Vitals:   12/24/13 2100 12/24/13 2152 12/24/13 2220 12/25/13 0500  BP: 134/65  132/56 103/52  Pulse: 78 77 71 108  Temp:  98.8 F (37.1 C) 100.1 F (37.8 C) 103.1 F (39.5 C)  TempSrc:  Oral Oral Oral  Resp:  17 18 18   Height:   5\' 11"  (1.803 m)   Weight:   324 lb 1.2 oz (147 kg) 324 lb 1.2 oz (147 kg)  SpO2: 100% 100% 96% 95%   Weight change:   Intake/Output Summary (Last 24 hours) at 12/25/13 0957 Last data filed at 12/24/13 1431  Gross per 24 hour  Intake      0 ml  Output      0 ml  Net      0 ml   Physical Exam    Physical Exam:   Filed Vitals:   12/24/13 2152 12/24/13 2220 12/25/13 0500 12/25/13 0959  BP:  132/56 103/52   Pulse: 77 71 108   Temp: 98.8 F (37.1 C) 100.1 F (37.8 C) 103.1 F (39.5 C) 102.9 F (39.4 C)  TempSrc: Oral Oral Oral Oral  Resp: 17 18 18    Height:  5\' 11"  (1.803 m)    Weight:  324 lb 1.2 oz (147 kg) 324 lb 1.2 oz (147 kg)   SpO2: 100% 96% 95%    General: Not in acute distress HEENT: PERRL, EOMI, no scleral icterus, No JVD or bruit Cardiac: S1/S2, RRR, No murmurs, gallops or rubs Pulm: Good air movement bilaterally. Mild wheezing. No rales, rhonchi or rubs. Abd:  Soft. Bowel sounds are normal. No distension. Mild tenderness in right flank and RLQ. There is no rebound and no guarding. Mild Right sided CVA tenderness.  Ext: No edema. 2+DP/PT pulse bilaterally Musculoskeletal: No joint deformities, erythema, or stiffness, ROM full Skin: No rashes.  Neuro: Alert and oriented X3, cranial nerves II-XII grossly intact, muscle strength 5/5 in all extremeties, sensation to light touch intact. Brachial reflex 2+  bilaterally. Knee reflex 1+ bilaterally. Negative Babinski's sign. Normal finger to nose test. Psych: Patient is not psychotic, no suicidal or hemocidal ideation.  Lab Results: Basic Metabolic Panel:  Recent Labs Lab 12/24/13 1200 12/25/13 0402  NA 139 140  K 3.9 3.6*  CL 101 104  CO2 24 21  GLUCOSE 120* 151*  BUN 11 11  CREATININE 0.99 1.10  CALCIUM 8.6 7.4*   Liver Function Tests:  Recent Labs Lab 12/24/13 1200  AST 17  ALT 15  ALKPHOS 81  BILITOT 1.0  PROT 7.3  ALBUMIN 3.2*    Recent Labs Lab 12/24/13 1200  LIPASE 14   No results found for this basename: AMMONIA,  in the last 168 hours CBC:  Recent Labs Lab 12/24/13 1200 12/25/13 0402  WBC 12.4* 10.6*  NEUTROABS 10.1*  --   HGB 12.3 10.6*  HCT 38.1 32.7*  MCV 88.4 88.4  PLT 288 225   Cardiac Enzymes: No results found for this basename: CKTOTAL, CKMB, CKMBINDEX, TROPONINI,  in the last 168 hours BNP: No results found for this basename: PROBNP,  in the last 168 hours D-Dimer: No results found for this basename: DDIMER,  in the last 168 hours CBG: No  results found for this basename: GLUCAP,  in the last 168 hours Hemoglobin A1C: No results found for this basename: HGBA1C,  in the last 168 hours Fasting Lipid Panel: No results found for this basename: CHOL, HDL, LDLCALC, TRIG, CHOLHDL, LDLDIRECT,  in the last 168 hours Thyroid Function Tests: No results found for this basename: TSH, T4TOTAL, FREET4, T3FREE, THYROIDAB,  in the last 168 hours Coagulation: No results found for this basename: LABPROT, INR,  in the last 168 hours Anemia Panel: No results found for this basename: VITAMINB12, FOLATE, FERRITIN, TIBC, IRON, RETICCTPCT,  in the last 168 hours Urine Drug Screen: Drugs of Abuse  No results found for this basename: labopia, cocainscrnur, labbenz, amphetmu, thcu, labbarb    Alcohol Level: No results found for this basename: ETH,  in the last 168 hours Urinalysis:  Recent Labs Lab  12/24/13 1510  COLORURINE AMBER*  LABSPEC 1.021  PHURINE 6.0  GLUCOSEU NEGATIVE  HGBUR LARGE*  BILIRUBINUR NEGATIVE  KETONESUR NEGATIVE  PROTEINUR 100*  UROBILINOGEN 1.0  NITRITE POSITIVE*  LEUKOCYTESUR MODERATE*   Misc. Labs:  Micro Results: Recent Results (from the past 240 hour(s))  WET PREP, GENITAL     Status: None   Collection Time    12/24/13  3:08 PM      Result Value Range Status   Yeast Wet Prep HPF POC NONE SEEN  NONE SEEN Final   Trich, Wet Prep NONE SEEN  NONE SEEN Final   Clue Cells Wet Prep HPF POC NONE SEEN  NONE SEEN Final   WBC, Wet Prep HPF POC NONE SEEN  NONE SEEN Final  GC/CHLAMYDIA PROBE AMP     Status: None   Collection Time    12/24/13  3:08 PM      Result Value Range Status   CT Probe RNA NEGATIVE  NEGATIVE Final   GC Probe RNA NEGATIVE  NEGATIVE Final   Comment: (NOTE)                                                                                               **Normal Reference Range: Negative**          Assay performed using the Gen-Probe APTIMA COMBO2 (R) Assay.     Acceptable specimen types for this assay include APTIMA Swabs (Unisex,     endocervical, urethral, or vaginal), first void urine, and ThinPrep     liquid based cytology samples.     Performed at [facility]  CULTURE, BLOOD (ROUTINE X 2)     Status: None   Collection Time    12/24/13  5:30 PM      Result Value Range Status   Specimen Description BLOOD ARM LEFT   Final   Special Requests BOTTLES DRAWN AEROBIC ONLY 10CC   Final   Culture  Setup Time     Final   Value: 12/24/2013 22:38     Performed at [facility]   Culture     Final   Value:        BLOOD CULTURE RECEIVED NO GROWTH TO DATE CULTURE WILL BE HELD FOR 5 DAYS BEFORE  ISSUING A FINAL NEGATIVE REPORT     Performed at [facility]   Report Status PENDING   Incomplete  CULTURE, BLOOD (ROUTINE X 2)     Status: None   Collection Time    12/24/13  5:35 PM      Result Value Range Status    Specimen Description BLOOD HAND LEFT   Final   Special Requests BOTTLES DRAWN AEROBIC ONLY 10CC   Final   Culture  Setup Time     Final   Value: 12/24/2013 22:40     Performed at [facility]   Culture     Final   Value:        BLOOD CULTURE RECEIVED NO GROWTH TO DATE CULTURE WILL BE HELD FOR 5 DAYS BEFORE ISSUING A FINAL NEGATIVE REPORT     Performed at [facility]   Report Status PENDING   Incomplete   Studies/Results: Dg Chest 2 View  12/25/2013   CLINICAL DATA:  Pneumonia, elevated fever, body aches  EXAM: CHEST  2 VIEW  COMPARISON:  DG CHEST 1V PORT dated 12/24/2013  FINDINGS: Low lung volumes. There is prominence of the interstitial markings. Diffuse bilateral pulmonary opacities are appreciated. No focal regions of consolidation appreciated. The cardiac silhouette and osseous structures are unremarkable.  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 No focal regions of consolidation.   Electronically Signed   By: Salome Holmes M.D.   On: 12/25/2013 08:46   US Transvaginal Non-ob  12/24/2013   CLINICAL DATA:  Fever and right lower quadrant discomfort, possible tubo-ovarian abscess.  EXAM: TRANSABDOMINAL AND TRANSVAGINAL ULTRASOUND OF PELVIS  TECHNIQUE: Both transabdominal and transvaginal ultrasound examinations of the pelvis were performed. Transabdominal technique was performed for global imaging of the pelvis including uterus, ovaries, adnexal regions, and pelvic cul-de-sac. It was necessary to proceed with endovaginal exam following the transabdominal exam to visualize the endometrium and adnexal structures.  COMPARISON:  None  FINDINGS: Uterus  Measurements: 6.7 x 3.2 x 4.3 cm. No fibroids or other mass visualized.  Endometrium  Thickness: 4.2 mm.  No focal abnormality visualized.  Right ovary  Measurements: 3.8 x 2.6 x 1.8 cm. Normal appearance/no adnexal mass.  Left ovary  Measurements: 2.7 x 1.7 x 1.5 cm. Normal  appearance/no adnexal mass.  Other findings  There is no free fluid or complex fluid collection in the pelvis. There is echogenic material within the urinary bladder.  IMPRESSION: 1. The uterus is normal in appearance. 2. The ovaries and adnexal structures exhibit no acute abnormalities. 3. Echogenic debris within the urinary bladder may be sterile cellular debris or could reflect cystitis or blood products. 4. If there are clinical concerns of possible acute appendicitis, CT scanning would be a useful next imaging step.   Electronically Signed   By: David  [place]   On: 12/24/2013 16:05   US Pelvis Complete  12/24/2013   CLINICAL DATA:  Fever and right lower quadrant discomfort, possible tubo-ovarian abscess.  EXAM: TRANSABDOMINAL AND TRANSVAGINAL ULTRASOUND OF PELVIS  TECHNIQUE: Both transabdominal and transvaginal ultrasound examinations of the pelvis were performed. Transabdominal technique was performed for global imaging of the pelvis including uterus, ovaries, adnexal regions, and pelvic cul-de-sac. It was necessary to proceed with endovaginal exam following the transabdominal exam to visualize the endometrium and adnexal structures.  COMPARISON:  None  FINDINGS: Uterus  Measurements: 6.7 x 3.2 x 4.3 cm. No fibroids or other mass visualized.  Endometrium  Thickness: 4.2 mm.  No  focal abnormality visualized.  Right ovary  Measurements: 3.8 x 2.6 x 1.8 cm. Normal appearance/no adnexal mass.  Left ovary  Measurements: 2.7 x 1.7 x 1.5 cm. Normal appearance/no adnexal mass.  Other findings  There is no free fluid or complex fluid collection in the pelvis. There is echogenic material within the urinary bladder.  IMPRESSION: 1. The uterus is normal in appearance. 2. The ovaries and adnexal structures exhibit no acute abnormalities. 3. Echogenic debris within the urinary bladder may be sterile cellular debris or could reflect cystitis or blood products. 4. If there are clinical concerns of possible acute  appendicitis, CT scanning would be a useful next imaging step.   Electronically Signed   By: David  [place]   On: 12/24/2013 16:05   Ct Abdomen Pelvis W Contrast  12/24/2013   CLINICAL DATA:  Right lower quadrant pain with nausea and vomiting for 4 days.  EXAM: CT ABDOMEN AND PELVIS WITH CONTRAST  TECHNIQUE: Multidetector CT imaging of the abdomen and pelvis was performed using the standard protocol following bolus administration of intravenous contrast.  CONTRAST:  100mL OMNIPAQUE IOHEXOL 300 MG/ML  SOLN  COMPARISON:  None.  FINDINGS: Lung Bases: Dependent atelectasis is present at at both lung bases.  Liver:  Normal.  Spleen:  Normal.  Gallbladder:  Normal.  Common bile duct:  Normal.  Pancreas:  Normal.  Adrenal glands:  Normal.  Kidneys: The left kidney and ureter appear normal. The right kidney demonstrates decreased enhancement in the lower pole band upper to interpolar region compatible with pyelonephritis. Correlation with urinalysis is recommended. This is best appreciated on the sagittal and coronal reconstructed images.  Stomach:  Normal.  Small bowel:  Normal.  Colon:   Normal appendix.  No inflammatory changes of colon.  Pelvic Genitourinary: Normal appearance of the uterus and adnexa. Urinary bladder is within normal limits.  Bones:  Normal.  Vasculature: Normal.  Body Wall: Normal.  IMPRESSION: Right pyelonephritis.   Electronically Signed   By: Andreas NewportGeoffrey  Lamke M.D.   On: 12/24/2013 18:57   Dg Chest Port 1 View  (if Code Sepsis Called)  12/24/2013   CLINICAL DATA:  Fever.  EXAM: PORTABLE CHEST - 1 VIEW  COMPARISON:  09/10/2007.  FINDINGS: Stable cardiac and mediastinal contours, enlarged, given differences in technique. Low lung volumes. No consolidative pulmonary opacities. Pulmonary vascular redistribution. No pleural effusion pneumothorax.  IMPRESSION: Mild cardiomegaly.  Pulmonary vascular redistribution.   Electronically Signed   By: Annia Beltrew  Davis M.D.   On: 12/24/2013 12:12   Medications:   Scheduled Meds: . cefTRIAXone (ROCEPHIN)  IV  1 g Intravenous Q24H  . enoxaparin (LOVENOX) injection  75 mg Subcutaneous QHS  . oseltamivir  75 mg Oral BID  . sodium chloride  3 mL Intravenous Q12H   Continuous Infusions: . sodium chloride 75 mL/hr at 12/24/13 2232   PRN Meds:.acetaminophen, albuterol, ondansetron (ZOFRAN) IV, ondansetron, oxyCODONE Assessment/Plan:  Sepsis 2/2 Pyelonephritis:  The patients flank pain, fever and burning on urination are most likely due to pyelonephritis complicated by sepsis, as evidenced by CT. UA is positive for UTI.  SIRS criteria are fever, WBC, tachycardia, tachypnea). PID is unlikely given no cervical motion tenderness. Appendicitis unlikely given normal appendix on CT. Patient was started with IV ceftriaxone and IV fluid treatment. She received 500 NS bolus in AM. Bp is 103/52 mmHg after NS bolus in AM. Blood Cultures no growth so far and urine culture positive for Escherichia coli. GC/Chlamydia negative. WBC trending down from 12.4-->10.6. She  still has fever with T102.9.  The repeated CXR showed diffuse bilateral interstitial infiltrate consistent with interstitial pneumonitis without focal regions of consolidation, this may also represent early stage of acute lung injury or early stage of ARDS. - switch Ceftriaxone to IV vancomycin and zosyn until urine culture sensitivity back. - Zofran 4 mg IV q 6 prn - IVFs at 125 cc/hr - F/U GC/Chlamydia.  - keep in 3W bed now. Will transfer to SDU if bp drops.  Cough The patients cough is likely due to asthma as she has history of asthma and there is wheezing bil on physical exam. Flu pcr negative, but patient has nasal congestion. 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 Patient was found to have a systolic murmur by admission team, but I could not appreciate  murmur today.   - Continue tamiflu  - Albuterol nebulized when necessary  Constipation: will start colace.   DVT: Lovenox Diet: Regular  Disposition: The patient does not have a current PCP (No primary provider on file.) and does need an [facility] hospital follow-up appointment after discharge.  The patient does not have transportation limitations that hinder transportation to clinic appointments.    LOS: 1 day   Lorretta Harp 12/25/2013, 9:57 AM

## 2013-12-25 NOTE — Progress Notes (Signed)
Subjective: Patient vomiting and had a temp to 103.1 this AM, received zofran and rectal tylenol, temp down to 102.9 immediately after administration. BP this AM 103/52, ordered 500 cc bolus and increased NS rate to 125 cc/hr from 75, BP resolved to 116/62. Patient with significant abdominal pain this AM, able to tolerate fruit and juice after zofran. Endorses dysuria, denies back pain. States that she has not had a BM in 4 days.   Objective: Vital signs in last 24 hours: Filed Vitals:   12/24/13 2100 12/24/13 2152 12/24/13 2220 12/25/13 0500  BP: 134/65  132/56 103/52  Pulse: 78 77 71 108  Temp:  98.8 F (37.1 C) 100.1 F (37.8 C) 103.1 F (39.5 C)  TempSrc:  Oral Oral Oral  Resp:  17 18 18   Height:   5\' 11"  (1.803 m)   Weight:   147 kg (324 lb 1.2 oz) 147 kg (324 lb 1.2 oz)  SpO2: 100% 100% 96% 95%   General: A&Ox3, in moderate distress especially with movement HEENT: MMM, EOMI CV: Regular rate and rhythm, no murmurs appreciated, no rubs/gallops Pulm: Scattered wheezes bilaterally, R>L, good air movement, no crackles, no increased work of breathing.  Abd: Obese, endorsing abdominal pain  Back: + for CVA tenderness on R Ext: No LE edema or erythema   Lab Results: WBC 12.4 > 10.6 Hgb 12.3 > 10.6 K 3.9 > 3.6   Micro Results: Recent Results (from the past 240 hour(s))  WET PREP, GENITAL     Status: None   Collection Time    12/24/13  3:08 PM      Result Value Range Status   Yeast Wet Prep HPF POC NONE SEEN  NONE SEEN Final   Trich, Wet Prep NONE SEEN  NONE SEEN Final   Clue Cells Wet Prep HPF POC NONE SEEN  NONE SEEN Final   WBC, Wet Prep HPF POC NONE SEEN  NONE SEEN Final  CULTURE, BLOOD (ROUTINE X 2)     Status: None   Collection Time    12/24/13  5:30 PM      Result Value Range Status   Specimen Description BLOOD ARM LEFT   Final   Special Requests BOTTLES DRAWN AEROBIC ONLY 10CC   Final   Culture  Setup Time     Final   Value: 12/24/2013 22:38     Performed at  [facility]   Culture     Final   Value:        BLOOD CULTURE RECEIVED NO GROWTH TO DATE CULTURE WILL BE HELD FOR 5 DAYS BEFORE ISSUING A FINAL NEGATIVE REPORT     Performed at [facility]   Report Status PENDING   Incomplete  CULTURE, BLOOD (ROUTINE X 2)     Status: None   Collection Time    12/24/13  5:35 PM      Result Value Range Status   Specimen Description BLOOD HAND LEFT   Final   Special Requests BOTTLES DRAWN AEROBIC ONLY 10CC   Final   Culture  Setup Time     Final   Value: 12/24/2013 22:40     Performed at [facility]   Culture     Final   Value:        BLOOD CULTURE RECEIVED NO GROWTH TO DATE CULTURE WILL BE HELD FOR 5 DAYS BEFORE ISSUING A FINAL NEGATIVE REPORT     Performed at [facility]   Report Status PENDING  Incomplete   Studies/Results: [place] Transvaginal Non-ob  12/24/2013   CLINICAL DATA:  Fever and right lower quadrant discomfort, possible tubo-ovarian abscess.  EXAM: TRANSABDOMINAL AND TRANSVAGINAL ULTRASOUND OF PELVIS  TECHNIQUE: Both transabdominal and transvaginal ultrasound examinations of the pelvis were performed. Transabdominal technique was performed for global imaging of the pelvis including uterus, ovaries, adnexal regions, and pelvic cul-de-sac. It was necessary to proceed with endovaginal exam following the transabdominal exam to visualize the endometrium and adnexal structures.  COMPARISON:  None  FINDINGS: Uterus  Measurements: 6.7 x 3.2 x 4.3 cm. No fibroids or other mass visualized.  Endometrium  Thickness: 4.2 mm.  No focal abnormality visualized.  Right ovary  Measurements: 3.8 x 2.6 x 1.8 cm. Normal appearance/no adnexal mass.  Left ovary  Measurements: 2.7 x 1.7 x 1.5 cm. Normal appearance/no adnexal mass.  Other findings  There is no free fluid or complex fluid collection in the pelvis. There is echogenic material within the urinary bladder.  IMPRESSION: 1. The uterus is normal in appearance. 2. The ovaries and  adnexal structures exhibit no acute abnormalities. 3. Echogenic debris within the urinary bladder may be sterile cellular debris or could reflect cystitis or blood products. 4. If there are clinical concerns of possible acute appendicitis, CT scanning would be a useful next imaging step.   Electronically Signed   By: David  [place]   On: 12/24/2013 16:05   [place] Pelvis Complete  12/24/2013   CLINICAL DATA:  Fever and right lower quadrant discomfort, possible tubo-ovarian abscess.  EXAM: TRANSABDOMINAL AND TRANSVAGINAL ULTRASOUND OF PELVIS  TECHNIQUE: Both transabdominal and transvaginal ultrasound examinations of the pelvis were performed. Transabdominal technique was performed for global imaging of the pelvis including uterus, ovaries, adnexal regions, and pelvic cul-de-sac. It was necessary to proceed with endovaginal exam following the transabdominal exam to visualize the endometrium and adnexal structures.  COMPARISON:  None  FINDINGS: Uterus  Measurements: 6.7 x 3.2 x 4.3 cm. No fibroids or other mass visualized.  Endometrium  Thickness: 4.2 mm.  No focal abnormality visualized.  Right ovary  Measurements: 3.8 x 2.6 x 1.8 cm. Normal appearance/no adnexal mass.  Left ovary  Measurements: 2.7 x 1.7 x 1.5 cm. Normal appearance/no adnexal mass.  Other findings  There is no free fluid or complex fluid collection in the pelvis. There is echogenic material within the urinary bladder.  IMPRESSION: 1. The uterus is normal in appearance. 2. The ovaries and adnexal structures exhibit no acute abnormalities. 3. Echogenic debris within the urinary bladder may be sterile cellular debris or could reflect cystitis or blood products. 4. If there are clinical concerns of possible acute appendicitis, CT scanning would be a useful next imaging step.   Electronically Signed   By: David  [place]   On: 12/24/2013 16:05   Ct Abdomen Pelvis W Contrast  12/24/2013   CLINICAL DATA:  Right lower quadrant pain with nausea and vomiting for  4 days.  EXAM: CT ABDOMEN AND PELVIS WITH CONTRAST  TECHNIQUE: Multidetector CT imaging of the abdomen and pelvis was performed using the standard protocol following bolus administration of intravenous contrast.  CONTRAST:  100mL OMNIPAQUE IOHEXOL 300 MG/ML  SOLN  COMPARISON:  None.  FINDINGS: Lung Bases: Dependent atelectasis is present at at both lung bases.  Liver:  Normal.  Spleen:  Normal.  Gallbladder:  Normal.  Common bile duct:  Normal.  Pancreas:  Normal.  Adrenal glands:  Normal.  Kidneys: The left kidney and ureter appear normal. The right kidney demonstrates  decreased enhancement in the lower pole band upper to interpolar region compatible with pyelonephritis. Correlation with urinalysis is recommended. This is best appreciated on the sagittal and coronal reconstructed images.  Stomach:  Normal.  Small bowel:  Normal.  Colon:   Normal appendix.  No inflammatory changes of colon.  Pelvic Genitourinary: Normal appearance of the uterus and adnexa. Urinary bladder is within normal limits.  Bones:  Normal.  Vasculature: Normal.  Body Wall: Normal.  IMPRESSION: Right pyelonephritis.   Electronically Signed   By: Andreas Newport M.D.   On: 12/24/2013 18:57   Dg Chest Port 1 View  (if Code Sepsis Called)  12/24/2013   CLINICAL DATA:  Fever.  EXAM: PORTABLE CHEST - 1 VIEW  COMPARISON:  09/10/2007.  FINDINGS: Stable cardiac and mediastinal contours, enlarged, given differences in technique. Low lung volumes. No consolidative pulmonary opacities. Pulmonary vascular redistribution. No pleural effusion pneumothorax.  IMPRESSION: Mild cardiomegaly.  Pulmonary vascular redistribution.   Electronically Signed   By: Annia Belt M.D.   On: 12/24/2013 12:12   Medications: I have reviewed the patient's current medications. Scheduled Meds: . acetaminophen  650 mg Rectal Once  . cefTRIAXone (ROCEPHIN)  IV  1 g Intravenous Q24H  . enoxaparin (LOVENOX) injection  75 mg Subcutaneous QHS  . oseltamivir  75 mg Oral BID    . sodium chloride  3 mL Intravenous Q12H   Continuous Infusions: . sodium chloride 75 mL/hr at 12/24/13 2232   PRN Meds:.acetaminophen, albuterol, ondansetron (ZOFRAN) IV, ondansetron, oxyCODONE Assessment/Plan: Principal Problem:   Sepsis Active Problems:   Pyelonephritis   Asthma, chronic  1) Sepsis 2/2 Pyelonephritis Patient admitted with WBC ct of 12.4, now febrile, with nausea/vomiting and abdominal pain and evidence of pyelonephritis of the R kidney on CT. Patient still with episodes of hypotension this AM and febrile. - Continue IV Rocephin - Morphine 2g q6h prn pain, avoiding NSAIDs for the time being due to slightly uptrending Cr and pyelo - Zofran PRN nausea - Stable on the floor for now, consider moving to step down if continued episodes of hypotension and fever   2) Flu - RESOLVED - Flu PCR negative today - d/c Tamiflu  3) Mild Cardiomegaly - Patient with "mild cardiomegaly" on portable CXR, EKG wnl with QTc 415, no evidence of cardiomegaly on upright CXR - Cancel planned echo  4) Constipation Patient reports no BM in 4 days - Miralax 17 g BID - Colace 100 mg BID  5) Hypokalemia - Likely 2/2 fluid overload, 3.9 on admission now 3.6 - 20 mEq once daily PO - Repeat BMP in AM  6) Asthma, chronic Stable, scattered wheeze bilaterally, satting well on RA.  - Albuterol nebulizer q2h prn   7) FEN/GI - Regular Diet  8) Dispo - Admit to floor, continue IV abx until clinically improved, possible d/c tomorrow - f/u with IM clinic as an outpatient  This is a Psychologist, occupational Note.  The care of the patient was discussed with Dr. Clyde Lundborg and the assessment and plan formulated with their assistance.  Please see their attached note for official documentation of the daily encounter.   LOS: 1 day   Daivd Council, Med Student 12/25/2013, 8:20 AM

## 2013-12-25 NOTE — Progress Notes (Signed)
   CARE MANAGEMENT NOTE 12/25/2013  Patient:  Natalie Ramsey,Natalie Ramsey   Account Number:  [id number]  Date Initiated:  12/25/2013  Documentation initiated by:  GRAVES-BIGELOW,Jazyiah Yiu  Subjective/Objective Assessment:   Pt admitted for female with pyelonephritis will be continued on ceftriaxone.     Action/Plan:   CM will continue to monitor for disposition needs.   Anticipated DC Date:  12/27/2013   Anticipated DC Plan:  HOME/SELF CARE      DC Planning Services  CM consult      Choice offered to / List presented to:             Status of service:  In process, will continue to follow Medicare Important Message given?   (If response is "NO", the following Medicare IM given date fields will be blank) Date Medicare IM given:   Date Additional Medicare IM given:    Discharge Disposition:    Per UR Regulation:  Reviewed for med. necessity/level of care/duration of stay  If discussed at Long Length of Stay Meetings, dates discussed:    Comments:

## 2013-12-25 NOTE — Progress Notes (Signed)
See my admission note.  Natalie BlueAlejandro Raevin Wierenga, DO, FACP Faculty [facility] 12/25/2013, 2:14 PM

## 2013-12-25 NOTE — Progress Notes (Addendum)
ANTIBIOTIC CONSULT NOTE - INITIAL  Pharmacy Consult for Vancomycin, Zosyn Indication: pyelonephritis  No Known Allergies  Patient Measurements: Height: 5\' 11"  (180.3 cm) Weight: 324 lb 1.2 oz (147 kg) IBW/kg (Calculated) : 70.8  Labs:  Recent Labs  12/24/13 1200 12/25/13 0402  WBC 12.4* 10.6*  HGB 12.3 10.6*  PLT 288 225  CREATININE 0.99 1.10    Microbiology: Recent Results (from the past 720 hour(s))  WET PREP, GENITAL     Status: None   Collection Time    12/24/13  3:08 PM      Result Value Range Status   Yeast Wet Prep HPF POC NONE SEEN  NONE SEEN Final   Trich, Wet Prep NONE SEEN  NONE SEEN Final   Clue Cells Wet Prep HPF POC NONE SEEN  NONE SEEN Final   WBC, Wet Prep HPF POC NONE SEEN  NONE SEEN Final  GC/CHLAMYDIA PROBE AMP     Status: None   Collection Time    12/24/13  3:08 PM      Result Value Range Status   CT Probe RNA NEGATIVE  NEGATIVE Final   GC Probe RNA NEGATIVE  NEGATIVE Final   Comment: (NOTE)                                                                                               **Normal Reference Range: Negative**          Assay performed using the Gen-Probe APTIMA COMBO2 (R) Assay.     Acceptable specimen types for this assay include APTIMA Swabs (Unisex,     endocervical, urethral, or vaginal), first void urine, and ThinPrep     liquid based cytology samples.     Performed at [facility]  URINE CULTURE     Status: None   Collection Time    12/24/13  3:10 PM      Result Value Range Status   Specimen Description URINE, CLEAN CATCH   Final   Special Requests NONE ADDED AT 1624   Final   Culture  Setup Time     Final   Value: 12/24/2013 20:55     Performed at [facility]   Colony Count     Final   Value: >=100,000 COLONIES/ML     Performed at [facility]   Culture     Final   Value: ESCHERICHIA COLI     Performed at [facility]   Report Status PENDING   Incomplete  CULTURE, BLOOD (ROUTINE X 2)      Status: None   Collection Time    12/24/13  5:30 PM      Result Value Range Status   Specimen Description BLOOD ARM LEFT   Final   Special Requests BOTTLES DRAWN AEROBIC ONLY 10CC   Final   Culture  Setup Time     Final   Value: 12/24/2013 22:38     Performed at [facility]   Culture     Final   Value:        BLOOD CULTURE RECEIVED NO GROWTH TO DATE CULTURE  WILL BE HELD FOR 5 DAYS BEFORE ISSUING A FINAL NEGATIVE REPORT     Performed at [facility]   Report Status PENDING   Incomplete  CULTURE, BLOOD (ROUTINE X 2)     Status: None   Collection Time    12/24/13  5:35 PM      Result Value Range Status   Specimen Description BLOOD HAND LEFT   Final   Special Requests BOTTLES DRAWN AEROBIC ONLY 10CC   Final   Culture  Setup Time     Final   Value: 12/24/2013 22:40     Performed at [facility]   Culture     Final   Value:        BLOOD CULTURE RECEIVED NO GROWTH TO DATE CULTURE WILL BE HELD FOR 5 DAYS BEFORE ISSUING A FINAL NEGATIVE REPORT     Performed at [facility]   Report Status PENDING   Incomplete    Medical History: Past Medical History  Diagnosis Date  . Asthma     Assessment: 23 year old female to begin Vancomycin and Zosyn for pyelonephritis.  Previously on Rocephin.  Scr stable  Goal of Therapy:  Vancomycin trough level 10-15 mcg/ml Appropriate Zosyn dosing  Plan:  1) Zosyn 3.375 grams iv Q 8 hours 2) Vancomycin 2000 mg iv x 1 dose now, then 1500 mg iv Q 12 hours 3) Follow up Scr, cultures, de-escalation  Thank you. Okey Regal, PharmD [phone]  12/25/2013,3:12 PM

## 2013-12-25 NOTE — Progress Notes (Signed)
UR Completed Tangi Shroff Graves-Bigelow, RN,BSN [phone]  

## 2013-12-25 NOTE — ED Provider Notes (Signed)
  Medical screening examination/treatment/procedure(s) were performed by non-physician practitioner and as supervising physician I was immediately available for consultation/collaboration.       Gerhard Munchobert Sunnie Odden, MD 12/25/13 [phone]

## 2013-12-25 NOTE — H&P (Signed)
INTERNAL MEDICINE TEACHING SERVICE Attending Admission Note  Date: 12/25/2013  Patient name: Natalie Ramsey  Medical record number: [id number]  Date of birth: December 17, 1990    I have seen and evaluated Natalie Ramsey and discussed their care with the Residency Team.  23 yr old female with a pmhx significant for asthma, presented with right sided flank pain. She states she has had associated fever, chills, N/V in the past few days. She admits to dysuria. She also states that she has been having right sided flank pain for "2 yrs" as well, but the fever and chills and increased pain is what made her come to the hospital. She admits to a slight dry cough on my history. Admits to a rhinorrhea. Denies sore throat. Denies myalgias. Denies sick contacts. Denies SOB. Admits to constipation past 4 days. On exam, Filed Vitals:   12/25/13 1214  BP:   Pulse:   Temp: 102.7 F (39.3 C)  Resp:   BP: 116/62 mmHg HR: 98 bpm RR: 18 per min O2 sat: 97% on RA  GEN: AAAx3, NAD. CV: S1S2, 2/6 SEM RUSB, no r/g, RRR. PULM: CTA bilat, no rales, no wheeze. ABD/GI: Soft, but mild tenderness RLQ without guarding and without distention. +BS. She does have +CVA tenderness right.  CT of the abdomen w/ contrast shows evidence of right pyelonephritis. She also have a TV U/S which showed echogenic debris within the urinary bladder. A CXR PA/LAT showed concerns for diffuse interstitial infiltrate, while CT abd that extended to bases showed dependent atelectasis.  Lab studies showed neg Influenza A/B, WBC of 12.4 on admission decreased to 10.6, and UA with +Nitritite +LE and 21-50 WBCs.  At this time, the physical exam, laboratory studies, and history point to pyelonephritis as the case of sepsis. She has received NS IV as fluid resuscitation and has been started on Rocephin IV 1 g Q24 hrs as she has mild to moderate pyelo. There is no evidence of hemodynamic instability. I do not think she has influenza or pneumonia. With  her hx of asthma, would monitor her for any acute decompensation from respiratory standpoint. Her UC is already growing >100k E. Coli. BCx without growth so far. Continue to monitor and await sensitivities. If she worsens from a clinical standpoint, can consider changing to Zosyn, Unasyn, or meropenem.   Jonah BlueAlejandro Kemiya Batdorf, DO, FACP Faculty [facility] 12/25/2013, 2:14 PM

## 2013-12-26 DIAGNOSIS — J8410 Pulmonary fibrosis, unspecified: Secondary | ICD-10-CM

## 2013-12-26 DIAGNOSIS — A498 Other bacterial infections of unspecified site: Secondary | ICD-10-CM

## 2013-12-26 LAB — LACTIC ACID, PLASMA: LACTIC ACID, VENOUS: 0.7 mmol/L (ref 0.5–2.2)

## 2013-12-26 LAB — CBC
HCT: 32 % — ABNORMAL LOW (ref 36.0–46.0)
Hemoglobin: 10.6 g/dL — ABNORMAL LOW (ref 12.0–15.0)
MCH: 28.8 pg (ref 26.0–34.0)
MCHC: 33.1 g/dL (ref 30.0–36.0)
MCV: 87 fL (ref 78.0–100.0)
Platelets: 218 10*3/uL (ref 150–400)
RBC: 3.68 MIL/uL — AB (ref 3.87–5.11)
RDW: 13.8 % (ref 11.5–15.5)
WBC: 9.8 10*3/uL (ref 4.0–10.5)

## 2013-12-26 LAB — BASIC METABOLIC PANEL
BUN: 6 mg/dL (ref 6–23)
CALCIUM: 7.3 mg/dL — AB (ref 8.4–10.5)
CHLORIDE: 104 meq/L (ref 96–112)
CO2: 22 meq/L (ref 19–32)
Creatinine, Ser: 0.87 mg/dL (ref 0.50–1.10)
GFR calc Af Amer: >90 mL/min (ref >90)
GFR calc non Af Amer: >90 mL/min (ref >90)
Glucose, Bld: 104 mg/dL — ABNORMAL HIGH (ref 70–99)
Potassium: 3.9 mEq/L (ref 3.7–5.3)
SODIUM: 138 meq/L (ref 137–147)

## 2013-12-26 LAB — CBC WITH DIFFERENTIAL/PLATELET
BASOS ABS: 0 10*3/uL (ref 0.0–0.1)
BASOS PCT: 0 % (ref 0–1)
Eosinophils Absolute: 0 10*3/uL (ref 0.0–0.7)
Eosinophils Relative: 0 % (ref 0–5)
HEMATOCRIT: 31.7 % — AB (ref 36.0–46.0)
HEMOGLOBIN: 10.1 g/dL — AB (ref 12.0–15.0)
Lymphocytes Relative: 16 % (ref 12–46)
Lymphs Abs: 1.6 10*3/uL (ref 0.7–4.0)
MCH: 28.1 pg (ref 26.0–34.0)
MCHC: 31.9 g/dL (ref 30.0–36.0)
MCV: 88.1 fL (ref 78.0–100.0)
Monocytes Absolute: 1.4 10*3/uL — ABNORMAL HIGH (ref 0.1–1.0)
Monocytes Relative: 14 % — ABNORMAL HIGH (ref 3–12)
NEUTROS ABS: 6.7 10*3/uL (ref 1.7–7.7)
NEUTROS PCT: 69 % (ref 43–77)
Platelets: 210 10*3/uL (ref 150–400)
RBC: 3.6 MIL/uL — ABNORMAL LOW (ref 3.87–5.11)
RDW: 13.8 % (ref 11.5–15.5)
WBC: 9.8 10*3/uL (ref 4.0–10.5)

## 2013-12-26 LAB — HIV ANTIBODY (ROUTINE TESTING W REFLEX): HIV: NONREACTIVE

## 2013-12-26 MED ORDER — SODIUM CHLORIDE 0.9 % IV SOLN
2.0000 g | Freq: Once | INTRAVENOUS | Status: AC
  Administered 2013-12-26: 2 g via INTRAVENOUS
  Filled 2013-12-26: qty 20

## 2013-12-26 MED ORDER — PROMETHAZINE HCL 12.5 MG PO TABS
12.5000 mg | ORAL_TABLET | Freq: Once | ORAL | Status: AC
  Administered 2013-12-26: 12.5 mg via ORAL
  Filled 2013-12-26: qty 1

## 2013-12-26 MED ORDER — ACETAMINOPHEN 325 MG PO TABS: 650.0000 mg | ORAL_TABLET | Freq: Four times a day (QID) | ORAL | Status: DC | PRN

## 2013-12-26 MED ORDER — POLYVINYL ALCOHOL 1.4 % OP SOLN
1.0000 [drp] | OPHTHALMIC | Status: DC | PRN
  Administered 2013-12-26: 1 [drp] via OPHTHALMIC
  Filled 2013-12-26: qty 15

## 2013-12-26 MED ORDER — PNEUMOCOCCAL VAC POLYVALENT 25 MCG/0.5ML IJ INJ
0.5000 mL | INJECTION | INTRAMUSCULAR | Status: DC
  Filled 2013-12-26: qty 0.5

## 2013-12-26 MED ORDER — INFLUENZA VAC SPLIT QUAD 0.5 ML IM SUSP
0.5000 mL | INTRAMUSCULAR | Status: DC
  Filled 2013-12-26: qty 0.5

## 2013-12-26 MED ORDER — DEXTROSE 5 % IV SOLN
2.0000 g | INTRAVENOUS | Status: DC
  Administered 2013-12-26: 2 g via INTRAVENOUS
  Filled 2013-12-26 (×2): qty 2

## 2013-12-26 NOTE — Progress Notes (Signed)
ANTIBIOTIC CONSULT NOTE - INITIAL  Pharmacy Consult for ceftriaxone Indication: pyelonephritis  No Known Allergies  Patient Measurements: Height: 5\' 11"  (180.3 cm) Weight: 324 lb 1.2 oz (147 kg) IBW/kg (Calculated) : 70.8  Vital Signs: Temp: 97.3 F (36.3 C) (01/29 1013) Temp src: Axillary (01/29 1013) BP: 101/76 mmHg (01/29 0752) Pulse Rate: 91 (01/29 0752) Intake/Output from previous day: 01/28 0701 - 01/29 0700 In: 360 [P.O.:360] Out: 401 [Urine:400; Stool:1] Intake/Output from this shift: Total I/O In: -  Out: 2 [Urine:1; Stool:1]  Labs:  Recent Labs  12/24/13 1200 12/25/13 0402 12/26/13 0538  WBC 12.4* 10.6* 9.8  HGB 12.3 10.6* 10.6*  PLT 288 225 218  CREATININE 0.99 1.10 0.87   Estimated Creatinine Clearance: 160.8 ml/min (by C-G formula based on Cr of 0.87). No results found for this basename: VANCOTROUGH, VANCOPEAK, VANCORANDOM, GENTTROUGH, GENTPEAK, GENTRANDOM, TOBRATROUGH, TOBRAPEAK, TOBRARND, AMIKACINPEAK, AMIKACINTROU, AMIKACIN,  in the last 72 hours   Microbiology: Recent Results (from the past 720 hour(s))  WET PREP, GENITAL     Status: None   Collection Time    12/24/13  3:08 PM      Result Value Range Status   Yeast Wet Prep HPF POC NONE SEEN  NONE SEEN Final   Trich, Wet Prep NONE SEEN  NONE SEEN Final   Clue Cells Wet Prep HPF POC NONE SEEN  NONE SEEN Final   WBC, Wet Prep HPF POC NONE SEEN  NONE SEEN Final  GC/CHLAMYDIA PROBE AMP     Status: None   Collection Time    12/24/13  3:08 PM      Result Value Range Status   CT Probe RNA NEGATIVE  NEGATIVE Final   GC Probe RNA NEGATIVE  NEGATIVE Final   Comment: (NOTE)                                                                                               **Normal Reference Range: Negative**          Assay performed using the Gen-Probe APTIMA COMBO2 (R) Assay.     Acceptable specimen types for this assay include APTIMA Swabs (Unisex,     endocervical, urethral, or vaginal), first void  urine, and ThinPrep     liquid based cytology samples.     Performed at [facility]  URINE CULTURE     Status: None   Collection Time    12/24/13  3:10 PM      Result Value Range Status   Specimen Description URINE, CLEAN CATCH   Final   Special Requests NONE ADDED AT 1624   Final   Culture  Setup Time     Final   Value: 12/24/2013 20:55     Performed at [facility] Count     Final   Value: >=100,000 COLONIES/ML     Performed at [facility]   Culture     Final   Value: ESCHERICHIA COLI     Performed at [facility]   Report Status 12/25/2013 FINAL   Final   Organism ID, Bacteria ESCHERICHIA COLI  Final  CULTURE, BLOOD (ROUTINE X 2)     Status: None   Collection Time    12/24/13  5:30 PM      Result Value Range Status   Specimen Description BLOOD ARM LEFT   Final   Special Requests BOTTLES DRAWN AEROBIC ONLY 10CC   Final   Culture  Setup Time     Final   Value: 12/24/2013 22:38     Performed at [facility]   Culture     Final   Value:        BLOOD CULTURE RECEIVED NO GROWTH TO DATE CULTURE WILL BE HELD FOR 5 DAYS BEFORE ISSUING A FINAL NEGATIVE REPORT     Performed at [facility]   Report Status PENDING   Incomplete  CULTURE, BLOOD (ROUTINE X 2)     Status: None   Collection Time    12/24/13  5:35 PM      Result Value Range Status   Specimen Description BLOOD HAND LEFT   Final   Special Requests BOTTLES DRAWN AEROBIC ONLY 10CC   Final   Culture  Setup Time     Final   Value: 12/24/2013 22:40     Performed at [facility]   Culture     Final   Value:        BLOOD CULTURE RECEIVED NO GROWTH TO DATE CULTURE WILL BE HELD FOR 5 DAYS BEFORE ISSUING A FINAL NEGATIVE REPORT     Performed at [facility]   Report Status PENDING   Incomplete    Medical History: Past Medical History  Diagnosis Date  . Asthma     Medications:  Scheduled:  . calcium gluconate  2 g Intravenous Once  . docusate  sodium  100 mg Oral BID  . enoxaparin (LOVENOX) injection  70 mg Subcutaneous QHS  . [START ON 12/27/2013] influenza vac split quadrivalent PF  0.5 mL Intramuscular Tomorrow-1000  . [START ON 12/27/2013] pneumococcal 23 valent vaccine  0.5 mL Intramuscular Tomorrow-1000  . polyethylene glycol  17 g Oral BID  . sodium chloride  3 mL Intravenous Q12H   Infusions:  . sodium chloride 125 mL/hr at 12/26/13 [id number]   Assessment: 23 yo morbidly obese F originally presenting with R sided flank pain and burning with urination. Patient originally started on ceftriaxone 1gm q24h, and only after receiving one dose coverage was broadened to vancomycin and Zosyn d/t concern with fevers.  Cultures and sensitivities reveal E.coli >100k pan-sensitive except resistant to ampicillin.  Pharmacy has been consulted to narrow therapy back to ceftriaxone.  Patient is febrile with a Tmax of 102.9, and WBC has trended down to 9.8.  SCr is trending down to 0.87 with an estimated CrCl >100.  Of note patient is 147 kg with a BMI ~45.  Goal of Therapy:  Resolution of infection  Plan:  - discontinue vancomycin and Zosyn - restart ceftriaxone, but at 2g q24h d/t patient weight - f/u fever curve, WBC, and clinical progress  Harrold Donathathan E. Achilles Dunkope, PharmD Clinical Pharmacist - Resident Pager: [phone] Pharmacy: [phone] 12/26/2013 11:08 AM

## 2013-12-26 NOTE — Clinical Documentation Improvement (Signed)
  Patient's BMI: 45.3 this admission. Please address in Notes and DC summary to illustrate severity of illness and risk of mortality. Thank you.  Possible Clinical conditions  - Morbid Obesity: BMI = or > 40 - Other condition (please specify)   Thank You, Beverley FiedlerLaurie E Melania Kirks ,RN Clinical Documentation Specialist:  [phone]  [facility]- Health Information Management

## 2013-12-26 NOTE — Progress Notes (Signed)
  Date: 12/26/2013  Patient name: Natalie Ramsey  Medical record number: [id number]  Date of birth: 01/02/1991   This patient has been seen and the plan of care was discussed with the house staff. Please see their note for complete details. I concur with their findings with the following additions/corrections: States she feels better this morning. Admits to some vomiting. States she has some RLQ pain. Overall, feels better. Noted to have persistent fever this morning. Received tylenol with resolution. Admits to +BM, no diarrhea. Denies SOB, cough this morning. On exam, Filed Vitals:   12/26/13 1412  BP: 93/65  Pulse: 71  Temp: 99.3 F (37.4 C)  Resp: 18   GEN: AAOx3, NAD CV: S1S2, no m/r/g, RRR PULM: CTA bilat ABD/GI: Soft. +BS. Tenderness is over right ASIS, no abdominal tenderness or guarding. I feel no mass. No distention.  LE: FROM bilat LE. 2/4 pulses.  Reviewed CXR. I do not think she has PNA or interstitial pneumonitis.  At this time, sepsis secondary to pyelonephritis. Would agree with changing therapy to Rocephin IV once again given sensitivities. Follow BC.Overall, clinically improved.  Jonah BlueAlejandro Kathi Dohn, DO, FACP Faculty [facility] 12/26/2013, 3:11 PM

## 2013-12-26 NOTE — Progress Notes (Addendum)
Subjective: Patient is still having right flank pain 7/10 (the same), RLQ "pelvis" pain, nausea, vomiting when she wakes up.  She is able to tolerate fruit only.  She did have a bowel movement.    Objective: Vital signs in last 24 hours: Filed Vitals:   12/25/13 2123 12/26/13 0624 12/26/13 0752 12/26/13 0811  BP: 97/48 93/72 101/76   Pulse: 93 91 91   Temp: 100.7 F (38.2 C) 102.9 F (39.4 C) 102.5 F (39.2 C) 102.3 F (39.1 C)  TempSrc: Oral Oral Oral Oral  Resp:  20 18   Height:      Weight:      SpO2: 96% 100% 93%    Weight change:   Intake/Output Summary (Last 24 hours) at 12/26/13 0905 Last data filed at 12/25/13 1843  Gross per 24 hour  Intake    240 ml  Output    401 ml  Net   -161 ml   Vitals reviewed. General: resting in recliner, NAD HEENT: [place]/at, no scleral icterus Cardiac: RRR, no rubs, murmurs or gallops Pulm: clear to auscultation bilaterally, no wheezes, rales, or rhonchi Abd: soft, obese, right CVA ttp, nondistended, BS present. No rebound/guarding Ext: warm and well perfused, no pedal edema Neuro: alert and oriented X3, cranial nerves II-XII grossly intact MSK: right pelvic pain with palpation  Lab Results: Basic Metabolic Panel:  Recent Labs Lab 12/25/13 0402 12/26/13 0538  NA 140 138  K 3.6* 3.9  CL 104 104  CO2 21 22  GLUCOSE 151* 104*  BUN 11 6  CREATININE 1.10 0.87  CALCIUM 7.4* 7.3*   Liver Function Tests:  Recent Labs Lab 12/24/13 1200  AST 17  ALT 15  ALKPHOS 81  BILITOT 1.0  PROT 7.3  ALBUMIN 3.2*    Recent Labs Lab 12/24/13 1200  LIPASE 14   CBC:  Recent Labs Lab 12/24/13 1200 12/25/13 0402 12/26/13 0538  WBC 12.4* 10.6* 9.8  NEUTROABS 10.1*  --   --   HGB 12.3 10.6* 10.6*  HCT 38.1 32.7* 32.0*  MCV 88.4 88.4 87.0  PLT 288 225 218   Urinalysis:  Recent Labs Lab 12/24/13 1510  COLORURINE AMBER*  LABSPEC 1.021  PHURINE 6.0  GLUCOSEU NEGATIVE  HGBUR LARGE*  BILIRUBINUR NEGATIVE  KETONESUR  NEGATIVE  PROTEINUR 100*  UROBILINOGEN 1.0  NITRITE POSITIVE*  LEUKOCYTESUR MODERATE*   Misc. Labs: none  Micro Results: Recent Results (from the past 240 hour(s))  WET PREP, GENITAL     Status: None   Collection Time    12/24/13  3:08 PM      Result Value Range Status   Yeast Wet Prep HPF POC NONE SEEN  NONE SEEN Final   Trich, Wet Prep NONE SEEN  NONE SEEN Final   Clue Cells Wet Prep HPF POC NONE SEEN  NONE SEEN Final   WBC, Wet Prep HPF POC NONE SEEN  NONE SEEN Final  GC/CHLAMYDIA PROBE AMP     Status: None   Collection Time    12/24/13  3:08 PM      Result Value Range Status   CT Probe RNA NEGATIVE  NEGATIVE Final   GC Probe RNA NEGATIVE  NEGATIVE Final   Comment: (NOTE)                                                                                               **  Normal Reference Range: Negative**          Assay performed using the Gen-Probe APTIMA COMBO2 (R) Assay.     Acceptable specimen types for this assay include APTIMA Swabs (Unisex,     endocervical, urethral, or vaginal), first void urine, and ThinPrep     liquid based cytology samples.     Performed at [facility]  URINE CULTURE     Status: None   Collection Time    12/24/13  3:10 PM      Result Value Range Status   Specimen Description URINE, CLEAN CATCH   Final   Special Requests NONE ADDED AT 1624   Final   Culture  Setup Time     Final   Value: 12/24/2013 20:55     Performed at [facility] Count     Final   Value: >=100,000 COLONIES/ML     Performed at [facility]   Culture     Final   Value: ESCHERICHIA COLI     Performed at [facility]   Report Status 12/25/2013 FINAL   Final   Organism ID, Bacteria ESCHERICHIA COLI   Final  CULTURE, BLOOD (ROUTINE X 2)     Status: None   Collection Time    12/24/13  5:30 PM      Result Value Range Status   Specimen Description BLOOD ARM LEFT   Final   Special Requests BOTTLES DRAWN AEROBIC ONLY 10CC   Final    Culture  Setup Time     Final   Value: 12/24/2013 22:38     Performed at [facility]   Culture     Final   Value:        BLOOD CULTURE RECEIVED NO GROWTH TO DATE CULTURE WILL BE HELD FOR 5 DAYS BEFORE ISSUING A FINAL NEGATIVE REPORT     Performed at [facility]   Report Status PENDING   Incomplete  CULTURE, BLOOD (ROUTINE X 2)     Status: None   Collection Time    12/24/13  5:35 PM      Result Value Range Status   Specimen Description BLOOD HAND LEFT   Final   Special Requests BOTTLES DRAWN AEROBIC ONLY 10CC   Final   Culture  Setup Time     Final   Value: 12/24/2013 22:40     Performed at [facility]   Culture     Final   Value:        BLOOD CULTURE RECEIVED NO GROWTH TO DATE CULTURE WILL BE HELD FOR 5 DAYS BEFORE ISSUING A FINAL NEGATIVE REPORT     Performed at [facility]   Report Status PENDING   Incomplete   Studies/Results: Dg Chest 2 View  12/25/2013   CLINICAL DATA:  Pneumonia, elevated fever, body aches  EXAM: CHEST  2 VIEW  COMPARISON:  DG CHEST 1V PORT dated 12/24/2013  FINDINGS: Low lung volumes. There is prominence of the interstitial markings. Diffuse bilateral pulmonary opacities are appreciated. No focal regions of consolidation appreciated. The cardiac silhouette and osseous structures are unremarkable.  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 No focal regions of consolidation.   Electronically Signed   By: Salome Holmes M.D.   On: 12/25/2013 08:46   US Transvaginal Non-ob  12/24/2013   CLINICAL DATA:  Fever and right lower quadrant discomfort, possible tubo-ovarian abscess.  EXAM: TRANSABDOMINAL AND TRANSVAGINAL ULTRASOUND OF PELVIS  TECHNIQUE: Both transabdominal and transvaginal ultrasound examinations of the pelvis were performed. Transabdominal technique was performed for global imaging of the pelvis including uterus, ovaries, adnexal regions, and pelvic cul-de-sac. It was  necessary to proceed with endovaginal exam following the transabdominal exam to visualize the endometrium and adnexal structures.  COMPARISON:  None  FINDINGS: Uterus  Measurements: 6.7 x 3.2 x 4.3 cm. No fibroids or other mass visualized.  Endometrium  Thickness: 4.2 mm.  No focal abnormality visualized.  Right ovary  Measurements: 3.8 x 2.6 x 1.8 cm. Normal appearance/no adnexal mass.  Left ovary  Measurements: 2.7 x 1.7 x 1.5 cm. Normal appearance/no adnexal mass.  Other findings  There is no free fluid or complex fluid collection in the pelvis. There is echogenic material within the urinary bladder.  IMPRESSION: 1. The uterus is normal in appearance. 2. The ovaries and adnexal structures exhibit no acute abnormalities. 3. Echogenic debris within the urinary bladder may be sterile cellular debris or could reflect cystitis or blood products. 4. If there are clinical concerns of possible acute appendicitis, CT scanning would be a useful next imaging step.   Electronically Signed   By: David  [place]   On: 12/24/2013 16:05   [place] Pelvis Complete  12/24/2013   CLINICAL DATA:  Fever and right lower quadrant discomfort, possible tubo-ovarian abscess.  EXAM: TRANSABDOMINAL AND TRANSVAGINAL ULTRASOUND OF PELVIS  TECHNIQUE: Both transabdominal and transvaginal ultrasound examinations of the pelvis were performed. Transabdominal technique was performed for global imaging of the pelvis including uterus, ovaries, adnexal regions, and pelvic cul-de-sac. It was necessary to proceed with endovaginal exam following the transabdominal exam to visualize the endometrium and adnexal structures.  COMPARISON:  None  FINDINGS: Uterus  Measurements: 6.7 x 3.2 x 4.3 cm. No fibroids or other mass visualized.  Endometrium  Thickness: 4.2 mm.  No focal abnormality visualized.  Right ovary  Measurements: 3.8 x 2.6 x 1.8 cm. Normal appearance/no adnexal mass.  Left ovary  Measurements: 2.7 x 1.7 x 1.5 cm. Normal appearance/no adnexal mass.   Other findings  There is no free fluid or complex fluid collection in the pelvis. There is echogenic material within the urinary bladder.  IMPRESSION: 1. The uterus is normal in appearance. 2. The ovaries and adnexal structures exhibit no acute abnormalities. 3. Echogenic debris within the urinary bladder may be sterile cellular debris or could reflect cystitis or blood products. 4. If there are clinical concerns of possible acute appendicitis, CT scanning would be a useful next imaging step.   Electronically Signed   By: David  [place]   On: 12/24/2013 16:05   Ct Abdomen Pelvis W Contrast  12/24/2013   CLINICAL DATA:  Right lower quadrant pain with nausea and vomiting for 4 days.  EXAM: CT ABDOMEN AND PELVIS WITH CONTRAST  TECHNIQUE: Multidetector CT imaging of the abdomen and pelvis was performed using the standard protocol following bolus administration of intravenous contrast.  CONTRAST:  100mL OMNIPAQUE IOHEXOL 300 MG/ML  SOLN  COMPARISON:  None.  FINDINGS: Lung Bases: Dependent atelectasis is present at at both lung bases.  Liver:  Normal.  Spleen:  Normal.  Gallbladder:  Normal.  Common bile duct:  Normal.  Pancreas:  Normal.  Adrenal glands:  Normal.  Kidneys: The left kidney and ureter appear normal. The right kidney demonstrates decreased enhancement in the lower pole band upper to interpolar region compatible with pyelonephritis. Correlation with urinalysis is recommended. This is best appreciated on the sagittal and coronal reconstructed images.  Stomach:  Normal.  Small bowel:  Normal.  Colon:   Normal appendix.  No inflammatory changes of colon.  Pelvic Genitourinary: Normal appearance of the uterus and adnexa. Urinary bladder is within normal limits.  Bones:  Normal.  Vasculature: Normal.  Body Wall: Normal.  IMPRESSION: Right pyelonephritis.   Electronically Signed   By: Andreas Newport M.D.   On: 12/24/2013 18:57   Dg Chest Port 1 View  (if Code Sepsis Called)  12/24/2013   CLINICAL DATA:   Fever.  EXAM: PORTABLE CHEST - 1 VIEW  COMPARISON:  09/10/2007.  FINDINGS: Stable cardiac and mediastinal contours, enlarged, given differences in technique. Low lung volumes. No consolidative pulmonary opacities. Pulmonary vascular redistribution. No pleural effusion pneumothorax.  IMPRESSION: Mild cardiomegaly.  Pulmonary vascular redistribution.   Electronically Signed   By: Annia Belt M.D.   On: 12/24/2013 12:12   Medications: Scheduled Meds: . docusate sodium  100 mg Oral BID  . enoxaparin (LOVENOX) injection  70 mg Subcutaneous QHS  . influenza vac split quadrivalent PF  0.5 mL Intramuscular Tomorrow-1000  . piperacillin-tazobactam (ZOSYN)  IV  3.375 g Intravenous Q8H  . pneumococcal 23 valent vaccine  0.5 mL Intramuscular Tomorrow-1000  . polyethylene glycol  17 g Oral BID  . sodium chloride  3 mL Intravenous Q12H  . vancomycin  1,500 mg Intravenous Q12H   Continuous Infusions: . sodium chloride 125 mL/hr at 12/25/13 2248   PRN Meds:.acetaminophen, albuterol, morphine injection, ondansetron (ZOFRAN) IV, ondansetron, oxyCODONE Assessment/Plan: 23 y.o history of chronic asthma presents for sepsis likely 2/2 E. Coli pyelonephritis and interstitial pneumonitis of CXR.  #Sepsis likely 2/2 E. Coli causing right pyelonephritis  -Pt still with fever. Monitor BP today  -Started on Rocephin then changed to vancomycin and Zosyn. Will d/c Zosyn change to Rocephin and d/c Vancomycin  -Blood cultures NTD and pending  #Interstitial pneumonitis/History of asthma -asthma stable.  -shown on CXR.  will check HIV antibody.  She is not on any medications prior to admission that can cause this. ?if secondary to h/o asthma/allergies Unknown if the patient has underlying autoimmune disorders.    #Unspecified constipation -Miralax, Colace   #F/E/N -NS 125 cc/hr  -electrolytes wnl except correct Ca 7.9 given Calcium gluconate 2 g today  -regular diet   #DVT Px  -Lovenox, scds   Dispo:  Disposition is deferred at this time, awaiting improvement of current medical problems.  Anticipated discharge in approximately 1-2 day(s).   The patient does not have a current PCP (No primary provider on file.) and does need an [facility] hospital follow-up appointment after discharge. Will ask where wants to establish care.    The patient does not have transportation limitations that hinder transportation to clinic appointments.  .Services Needed at time of discharge: Y = Yes, Blank = No PT:   OT:   RN:   Equipment:   Other:     LOS: 2 days   Annett Gula, MD [phone] 12/26/2013, 9:05 AM

## 2013-12-26 NOTE — Progress Notes (Signed)
Agree with medical student findings  See my note for more details   Desma Maximracy McLean MD

## 2013-12-26 NOTE — Progress Notes (Signed)
Subjective: Patient feeling slightly improved this AM. She endorses some vomiting on first waking up but has not had any episodes since. Still endorsing RLQ abdominal pain, unchanged from yesterday. Has an episode of fever to 102.9 this AM, got PO tylenol, afebrile since. Had an episode of hypotension Broadened coverage yesterday to Vanc and Zosyn due to repeated episodes of fever and HTN. Patient denies current nausea/vomiting, fever/chills, able to tolerate some fruit this morning. Had a few BMs yesterday, adequate UOP.    Objective: Vital signs in last 24 hours: Filed Vitals:   12/25/13 2123 12/26/13 0624 12/26/13 0752 12/26/13 0811  BP: 97/48 93/72 101/76   Pulse: 93 91 91   Temp: 100.7 F (38.2 C) 102.9 F (39.4 C) 102.5 F (39.2 C) 102.3 F (39.1 C)  TempSrc: Oral Oral Oral Oral  Resp:  20 18   Height:      Weight:      SpO2: 96% 100% 93%    Weight change:   Intake/Output Summary (Last 24 hours) at 12/26/13 0900 Last data filed at 12/25/13 1843  Gross per 24 hour  Intake    240 ml  Output    401 ml  Net   -161 ml    Lab Results: Cr: 1.10 > 0.87 K: 3.6 > 3.9 Hgb: 10.6 > 10.1  Micro Results: Recent Results (from the past 240 hour(s))  WET PREP, GENITAL     Status: None   Collection Time    12/24/13  3:08 PM      Result Value Range Status   Yeast Wet Prep HPF POC NONE SEEN  NONE SEEN Final   Trich, Wet Prep NONE SEEN  NONE SEEN Final   Clue Cells Wet Prep HPF POC NONE SEEN  NONE SEEN Final   WBC, Wet Prep HPF POC NONE SEEN  NONE SEEN Final  GC/CHLAMYDIA PROBE AMP     Status: None   Collection Time    12/24/13  3:08 PM      Result Value Range Status   CT Probe RNA NEGATIVE  NEGATIVE Final   GC Probe RNA NEGATIVE  NEGATIVE Final   Comment: (NOTE)                                                                                               **Normal Reference Range: Negative**          Assay performed using the Gen-Probe APTIMA COMBO2 (R) Assay.     Acceptable  specimen types for this assay include APTIMA Swabs (Unisex,     endocervical, urethral, or vaginal), first void urine, and ThinPrep     liquid based cytology samples.     Performed at [facility]  URINE CULTURE     Status: None   Collection Time    12/24/13  3:10 PM      Result Value Range Status   Specimen Description URINE, CLEAN CATCH   Final   Special Requests NONE ADDED AT 1624   Final   Culture  Setup Time     Final   Value: 12/24/2013 20:55  Performed at [facility] Count     Final   Value: >=100,000 COLONIES/ML     Performed at [facility]   Culture     Final   Value: ESCHERICHIA COLI     Performed at [facility]   Report Status 12/25/2013 FINAL   Final   Organism ID, Bacteria ESCHERICHIA COLI   Final  CULTURE, BLOOD (ROUTINE X 2)     Status: None   Collection Time    12/24/13  5:30 PM      Result Value Range Status   Specimen Description BLOOD ARM LEFT   Final   Special Requests BOTTLES DRAWN AEROBIC ONLY 10CC   Final   Culture  Setup Time     Final   Value: 12/24/2013 22:38     Performed at [facility]   Culture     Final   Value:        BLOOD CULTURE RECEIVED NO GROWTH TO DATE CULTURE WILL BE HELD FOR 5 DAYS BEFORE ISSUING A FINAL NEGATIVE REPORT     Performed at [facility]   Report Status PENDING   Incomplete  CULTURE, BLOOD (ROUTINE X 2)     Status: None   Collection Time    12/24/13  5:35 PM      Result Value Range Status   Specimen Description BLOOD HAND LEFT   Final   Special Requests BOTTLES DRAWN AEROBIC ONLY 10CC   Final   Culture  Setup Time     Final   Value: 12/24/2013 22:40     Performed at [facility]   Culture     Final   Value:        BLOOD CULTURE RECEIVED NO GROWTH TO DATE CULTURE WILL BE HELD FOR 5 DAYS BEFORE ISSUING A FINAL NEGATIVE REPORT     Performed at [facility]   Report Status PENDING   Incomplete   Studies/Results: Dg Chest 2 View  12/25/2013    CLINICAL DATA:  Pneumonia, elevated fever, body aches  EXAM: CHEST  2 VIEW  COMPARISON:  DG CHEST 1V PORT dated 12/24/2013  FINDINGS: Low lung volumes. There is prominence of the interstitial markings. Diffuse bilateral pulmonary opacities are appreciated. No focal regions of consolidation appreciated. The cardiac silhouette and osseous structures are unremarkable.  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 No focal regions of consolidation.   Electronically Signed   By: Salome Holmes M.D.   On: 12/25/2013 08:46   US Transvaginal Non-ob  12/24/2013   CLINICAL DATA:  Fever and right lower quadrant discomfort, possible tubo-ovarian abscess.  EXAM: TRANSABDOMINAL AND TRANSVAGINAL ULTRASOUND OF PELVIS  TECHNIQUE: Both transabdominal and transvaginal ultrasound examinations of the pelvis were performed. Transabdominal technique was performed for global imaging of the pelvis including uterus, ovaries, adnexal regions, and pelvic cul-de-sac. It was necessary to proceed with endovaginal exam following the transabdominal exam to visualize the endometrium and adnexal structures.  COMPARISON:  None  FINDINGS: Uterus  Measurements: 6.7 x 3.2 x 4.3 cm. No fibroids or other mass visualized.  Endometrium  Thickness: 4.2 mm.  No focal abnormality visualized.  Right ovary  Measurements: 3.8 x 2.6 x 1.8 cm. Normal appearance/no adnexal mass.  Left ovary  Measurements: 2.7 x 1.7 x 1.5 cm. Normal appearance/no adnexal mass.  Other findings  There is no free fluid or complex fluid collection in the pelvis. There is echogenic material within the urinary bladder.  IMPRESSION: 1. The uterus is normal in appearance. 2. The ovaries and adnexal structures exhibit no acute abnormalities. 3. Echogenic debris within the urinary bladder may be sterile cellular debris or could reflect cystitis or blood products. 4. If there are clinical concerns of possible acute appendicitis, CT scanning  would be a useful next imaging step.   Electronically Signed   By: David  [place]   On: 12/24/2013 16:05   US Pelvis Complete  12/24/2013   CLINICAL DATA:  Fever and right lower quadrant discomfort, possible tubo-ovarian abscess.  EXAM: TRANSABDOMINAL AND TRANSVAGINAL ULTRASOUND OF PELVIS  TECHNIQUE: Both transabdominal and transvaginal ultrasound examinations of the pelvis were performed. Transabdominal technique was performed for global imaging of the pelvis including uterus, ovaries, adnexal regions, and pelvic cul-de-sac. It was necessary to proceed with endovaginal exam following the transabdominal exam to visualize the endometrium and adnexal structures.  COMPARISON:  None  FINDINGS: Uterus  Measurements: 6.7 x 3.2 x 4.3 cm. No fibroids or other mass visualized.  Endometrium  Thickness: 4.2 mm.  No focal abnormality visualized.  Right ovary  Measurements: 3.8 x 2.6 x 1.8 cm. Normal appearance/no adnexal mass.  Left ovary  Measurements: 2.7 x 1.7 x 1.5 cm. Normal appearance/no adnexal mass.  Other findings  There is no free fluid or complex fluid collection in the pelvis. There is echogenic material within the urinary bladder.  IMPRESSION: 1. The uterus is normal in appearance. 2. The ovaries and adnexal structures exhibit no acute abnormalities. 3. Echogenic debris within the urinary bladder may be sterile cellular debris or could reflect cystitis or blood products. 4. If there are clinical concerns of possible acute appendicitis, CT scanning would be a useful next imaging step.   Electronically Signed   By: David  [place]   On: 12/24/2013 16:05   Ct Abdomen Pelvis W Contrast  12/24/2013   CLINICAL DATA:  Right lower quadrant pain with nausea and vomiting for 4 days.  EXAM: CT ABDOMEN AND PELVIS WITH CONTRAST  TECHNIQUE: Multidetector CT imaging of the abdomen and pelvis was performed using the standard protocol following bolus administration of intravenous contrast.  CONTRAST:  OMNIPAQUE IOHEXOL 300  MG/ML  SOLN  COMPARISON:  None.  FINDINGS: Lung Bases: Dependent atelectasis is present at at both lung bases.  Liver:  Normal.  Spleen:  Normal.  Gallbladder:  Normal.  Common bile duct:  Normal.  Pancreas:  Normal.  Adrenal glands:  Normal.  Kidneys: The left kidney and ureter appear normal. The right kidney demonstrates decreased enhancement in the lower pole band upper to interpolar region compatible with pyelonephritis. Correlation with urinalysis is recommended. This is best appreciated on the sagittal and coronal reconstructed images.  Stomach:  Normal.  Small bowel:  Normal.  Colon:   Normal appendix.  No inflammatory changes of colon.  Pelvic Genitourinary: Normal appearance of the uterus and adnexa. Urinary bladder is within normal limits.  Bones:  Normal.  Vasculature: Normal.  Body Wall: Normal.  IMPRESSION: Right pyelonephritis.   Electronically Signed   By: Andreas Newport M.D.   On: 12/24/2013 18:57   Dg Chest Port 1 View  (if Code Sepsis Called)  12/24/2013   CLINICAL DATA:  Fever.  EXAM: PORTABLE CHEST - 1 VIEW  COMPARISON:  09/10/2007.  FINDINGS: Stable cardiac and mediastinal contours, enlarged, given differences in technique. Low lung volumes. No consolidative pulmonary opacities. Pulmonary vascular redistribution. No pleural effusion pneumothorax.  IMPRESSION: Mild cardiomegaly.  Pulmonary vascular redistribution.   Electronically Signed  By: Annia Belt M.D.   On: 12/24/2013 12:12   Medications: I have reviewed the patient's current medications. Scheduled Meds: . docusate sodium  100 mg Oral BID  . enoxaparin (LOVENOX) injection  70 mg Subcutaneous QHS  . influenza vac split quadrivalent PF  0.5 mL Intramuscular Tomorrow-1000  . piperacillin-tazobactam (ZOSYN)  IV  3.375 g Intravenous Q8H  . pneumococcal 23 valent vaccine  0.5 mL Intramuscular Tomorrow-1000  . polyethylene glycol  17 g Oral BID  . sodium chloride  3 mL Intravenous Q12H  . vancomycin  1,500 mg Intravenous Q12H    Continuous Infusions: . sodium chloride 125 mL/hr at 12/25/13 2248   PRN Meds:.acetaminophen, albuterol, morphine injection, ondansetron (ZOFRAN) IV, ondansetron, oxyCODONE Assessment/Plan: Principal Problem:   Sepsis Active Problems:   Pyelonephritis   Asthma, chronic   Unspecified constipation   Hypokalemia   Interstitial pneumonitis  1) Sepsis 2/2 Pyelonephritis  Patient admitted with WBC ct of 12.4, febrile, with nausea/vomiting and abdominal pain and evidence of pyelonephritis of the R kidney on CT. Patient intermittently febrile, reports some clinical improvement, less vomiting, resolution of leukocytosis. Abx broadened yesterday but sensitivities revealed E Coli sensitive to rocephin, abx narrowed again today.  - d/c Vanc/Zosyn - Continue IV Rocephin 2g daily due to patient weight (BMI 45) - Morphine 2g q6h prn pain, avoiding NSAIDS due to pyelo  - Zofran PRN nausea  - Tylenol 650 po PRN fever > 103  - Stable on the floor for now  2) Constipation - RESOLVED Pt with multiple BMs yesterday - Miralax 17 g BID - Colace 100 mg BID  3) Hypokalemia- RESOLVED Likely 2/2 fluid overload, 3.9 > 3.6 > 3.9 - 20 mEq once daily PO - BMP in AM  4) Asthma, chronic  Lungs CTAB, satting well on RA.  - Albuterol nebulizer q2h prn   5) FEN/GI  - Regular Diet   6) Dispo  - Admit to floor, continue IV abx until clinically improved, possible d/c tomorrow  - f/u with IM clinic as an outpatient   This is a Psychologist, occupational Note.  The care of the patient was discussed with Drs. Wyline Beady, and [place] and the assessment and plan formulated with their assistance.  Please see their attached note for official documentation of the daily encounter.   LOS: 2 days   Daivd Council, Med Student 12/26/2013, 9:00 AM

## 2013-12-27 ENCOUNTER — Inpatient Hospital Stay (HOSPITAL_COMMUNITY): Payer: BC Managed Care – PPO

## 2013-12-27 ENCOUNTER — Encounter (HOSPITAL_COMMUNITY): Payer: Self-pay | Admitting: Radiology

## 2013-12-27 ENCOUNTER — Encounter: Payer: Self-pay | Admitting: Internal Medicine

## 2013-12-27 DIAGNOSIS — R509 Fever, unspecified: Secondary | ICD-10-CM | POA: Diagnosis present

## 2013-12-27 LAB — CBC WITH DIFFERENTIAL/PLATELET
Basophils Absolute: 0 10*3/uL (ref 0.0–0.1)
Basophils Relative: 0 % (ref 0–1)
EOS PCT: 1 % (ref 0–5)
Eosinophils Absolute: 0.1 10*3/uL (ref 0.0–0.7)
HCT: 33.5 % — ABNORMAL LOW (ref 36.0–46.0)
Hemoglobin: 10.8 g/dL — ABNORMAL LOW (ref 12.0–15.0)
LYMPHS ABS: 1.1 10*3/uL (ref 0.7–4.0)
Lymphocytes Relative: 13 % (ref 12–46)
MCH: 28.3 pg (ref 26.0–34.0)
MCHC: 32.2 g/dL (ref 30.0–36.0)
MCV: 87.7 fL (ref 78.0–100.0)
MONO ABS: 1 10*3/uL (ref 0.1–1.0)
Monocytes Relative: 11 % (ref 3–12)
Neutro Abs: 6.5 10*3/uL (ref 1.7–7.7)
Neutrophils Relative %: 75 % (ref 43–77)
Platelets: 254 10*3/uL (ref 150–400)
RBC: 3.82 MIL/uL — AB (ref 3.87–5.11)
RDW: 14.1 % (ref 11.5–15.5)
WBC: 8.6 10*3/uL (ref 4.0–10.5)

## 2013-12-27 LAB — BASIC METABOLIC PANEL
BUN: 5 mg/dL — ABNORMAL LOW (ref 6–23)
CO2: 23 meq/L (ref 19–32)
CREATININE: 0.9 mg/dL (ref 0.50–1.10)
Calcium: 8 mg/dL — ABNORMAL LOW (ref 8.4–10.5)
Chloride: 99 mEq/L (ref 96–112)
GFR calc Af Amer: >90 mL/min (ref >90)
GFR calc non Af Amer: 89 mL/min — ABNORMAL LOW (ref >90)
GLUCOSE: 100 mg/dL — AB (ref 70–99)
Potassium: 3.6 mEq/L — ABNORMAL LOW (ref 3.7–5.3)
SODIUM: 137 meq/L (ref 137–147)

## 2013-12-27 LAB — MAGNESIUM: MAGNESIUM: 1.9 mg/dL (ref 1.5–2.5)

## 2013-12-27 LAB — HEMOGLOBIN A1C
HEMOGLOBIN A1C: 5.5 % (ref <5.7)
MEAN PLASMA GLUCOSE: 111 mg/dL (ref <117)

## 2013-12-27 MED ORDER — ENOXAPARIN SODIUM 40 MG/0.4ML ~~LOC~~ SOLN: 40.0000 mg | Freq: Every day | SUBCUTANEOUS | Status: DC

## 2013-12-27 MED ORDER — IOHEXOL 300 MG/ML  SOLN
25.0000 mL | INTRAMUSCULAR | Status: AC
  Administered 2013-12-27: 25 mL via INTRAVENOUS

## 2013-12-27 MED ORDER — IOHEXOL 300 MG/ML  SOLN
100.0000 mL | Freq: Once | INTRAMUSCULAR | Status: AC | PRN
  Administered 2013-12-27: 100 mL via INTRAVENOUS

## 2013-12-27 MED ORDER — PIPERACILLIN-TAZOBACTAM 3.375 G IVPB
3.3750 g | Freq: Three times a day (TID) | INTRAVENOUS | Status: DC
  Administered 2013-12-27 – 2013-12-28 (×3): 3.375 g via INTRAVENOUS
  Filled 2013-12-27 (×5): qty 50

## 2013-12-27 MED ORDER — POTASSIUM CHLORIDE CRYS ER 20 MEQ PO TBCR
40.0000 meq | EXTENDED_RELEASE_TABLET | Freq: Once | ORAL | Status: AC
  Administered 2013-12-27: 40 meq via ORAL
  Filled 2013-12-27: qty 2

## 2013-12-27 MED ORDER — ENOXAPARIN SODIUM 80 MG/0.8ML ~~LOC~~ SOLN
70.0000 mg | Freq: Every day | SUBCUTANEOUS | Status: DC
  Administered 2013-12-27 – 2013-12-28 (×2): 70 mg via SUBCUTANEOUS
  Filled 2013-12-27 (×3): qty 0.8

## 2013-12-27 NOTE — Progress Notes (Signed)
Post void residual 52 cc

## 2013-12-27 NOTE — Progress Notes (Signed)
  Date: 12/27/2013  Patient name: Natalie Ramsey  Medical record number: [id number]  Date of birth: 1991/06/21   This patient has been seen and the plan of care was discussed with the house staff. Please see their note for complete details. I concur with their findings with the following additions/corrections: She feels better, but still with N/V. On exam today she is noted to have pain in RLQ with palpation of LLQ. This is completely different from yesterday and prior exams. She continues to have recurrent fever. She has been slow to improve. UCx sensitivities have returned. BC without growth. I would expect her to improve quicker than this, this is hospital day #4 and she is still having fevers to 102.3 F on abx therapy. At this time, repeat CT of abdomen and pelvis. Will look for evidence of abscess or evidence of appendicitis. Change to Zosyn.  Jonah BlueAlejandro Tavish Gettis, DO, FACP [facility] 12/27/2013, 3:41 PM

## 2013-12-27 NOTE — Progress Notes (Addendum)
Subjective: Pt c/o h/a temporal today.  Denies neck pain or stiffness.  She is still nauseated/vomiting in the am.  Denies cough, sob.  H/a and Right LQ ab pain are 6-7/10.  She states she is feeling a little better than yesterday. Pt willing to try bland diet.  Constipation resolved.   Objective: Vital signs in last 24 hours: Filed Vitals:   12/26/13 1013 12/26/13 1412 12/26/13 2056 12/27/13 0500  BP:  93/65 132/70 125/72  Pulse:  71 92 93  Temp: 97.3 F (36.3 C) 99.3 F (37.4 C) 102.3 F (39.1 C) 101.3 F (38.5 C)  TempSrc: Axillary Oral Oral Oral  Resp:  18    Height:      Weight:      SpO2:  99% 98% 95%   Weight change:   Intake/Output Summary (Last 24 hours) at 12/27/13 0804 Last data filed at 12/26/13 1800  Gross per 24 hour  Intake    960 ml  Output    803 ml  Net    157 ml   Vitals reviewed. General: resting in bed, NAD HEENT: [place]/at, no scleral icterus Cardiac: RRR, no rubs, murmurs or gallops Pulm: clear to auscultation bilaterally, no wheezes, rales, or rhonchi Abd: soft, obese, right CVA ttp, +Rovsings sign on exam (palpation in LLQ caused pain in RLQ), nondistended, BS present.  Ext: warm and well perfused, no pedal edema Neuro: alert and oriented X3, cranial nerves II-XII grossly intact  Lab Results: Basic Metabolic Panel:  Recent Labs Lab 12/26/13 0538 12/27/13 0535  NA 138 137  K 3.9 3.6*  CL 104 99  CO2 22 23  GLUCOSE 104* 100*  BUN 6 5*  CREATININE 0.87 0.90  CALCIUM 7.3* 8.0*   Liver Function Tests:  Recent Labs Lab 12/24/13 1200  AST 17  ALT 15  ALKPHOS 81  BILITOT 1.0  PROT 7.3  ALBUMIN 3.2*    Recent Labs Lab 12/24/13 1200  LIPASE 14   CBC:  Recent Labs Lab 12/26/13 1039 12/27/13 0535  WBC 9.8 8.6  NEUTROABS 6.7 6.5  HGB 10.1* 10.8*  HCT 31.7* 33.5*  MCV 88.1 87.7  PLT 210 254   Urinalysis:  Recent Labs Lab 12/24/13 1510  COLORURINE AMBER*  LABSPEC 1.021  PHURINE 6.0  GLUCOSEU NEGATIVE  HGBUR LARGE*   BILIRUBINUR NEGATIVE  KETONESUR NEGATIVE  PROTEINUR 100*  UROBILINOGEN 1.0  NITRITE POSITIVE*  LEUKOCYTESUR MODERATE*   Misc. Labs: none  Micro Results: Recent Results (from the past 240 hour(s))  WET PREP, GENITAL     Status: None   Collection Time    12/24/13  3:08 PM      Result Value Range Status   Yeast Wet Prep HPF POC NONE SEEN  NONE SEEN Final   Trich, Wet Prep NONE SEEN  NONE SEEN Final   Clue Cells Wet Prep HPF POC NONE SEEN  NONE SEEN Final   WBC, Wet Prep HPF POC NONE SEEN  NONE SEEN Final  GC/CHLAMYDIA PROBE AMP     Status: None   Collection Time    12/24/13  3:08 PM      Result Value Range Status   CT Probe RNA NEGATIVE  NEGATIVE Final   GC Probe RNA NEGATIVE  NEGATIVE Final   Comment: (NOTE)                                                                                               **  Normal Reference Range: Negative**          Assay performed using the Gen-Probe APTIMA COMBO2 (R) Assay.     Acceptable specimen types for this assay include APTIMA Swabs (Unisex,     endocervical, urethral, or vaginal), first void urine, and ThinPrep     liquid based cytology samples.     Performed at [facility]  URINE CULTURE     Status: None   Collection Time    12/24/13  3:10 PM      Result Value Range Status   Specimen Description URINE, CLEAN CATCH   Final   Special Requests NONE ADDED AT 1624   Final   Culture  Setup Time     Final   Value: 12/24/2013 20:55     Performed at [facility] Count     Final   Value: >=100,000 COLONIES/ML     Performed at [facility]   Culture     Final   Value: ESCHERICHIA COLI     Performed at [facility]   Report Status 12/25/2013 FINAL   Final   Organism ID, Bacteria ESCHERICHIA COLI   Final  CULTURE, BLOOD (ROUTINE X 2)     Status: None   Collection Time    12/24/13  5:30 PM      Result Value Range Status   Specimen Description BLOOD ARM LEFT   Final   Special Requests BOTTLES  DRAWN AEROBIC ONLY 10CC   Final   Culture  Setup Time     Final   Value: 12/24/2013 22:38     Performed at [facility]   Culture     Final   Value:        BLOOD CULTURE RECEIVED NO GROWTH TO DATE CULTURE WILL BE HELD FOR 5 DAYS BEFORE ISSUING A FINAL NEGATIVE REPORT     Performed at [facility]   Report Status PENDING   Incomplete  CULTURE, BLOOD (ROUTINE X 2)     Status: None   Collection Time    12/24/13  5:35 PM      Result Value Range Status   Specimen Description BLOOD HAND LEFT   Final   Special Requests BOTTLES DRAWN AEROBIC ONLY 10CC   Final   Culture  Setup Time     Final   Value: 12/24/2013 22:40     Performed at [facility]   Culture     Final   Value:        BLOOD CULTURE RECEIVED NO GROWTH TO DATE CULTURE WILL BE HELD FOR 5 DAYS BEFORE ISSUING A FINAL NEGATIVE REPORT     Performed at [facility]   Report Status PENDING   Incomplete   Studies/Results: Dg Chest 2 View  12/25/2013   CLINICAL DATA:  Pneumonia, elevated fever, body aches  EXAM: CHEST  2 VIEW  COMPARISON:  DG CHEST 1V PORT dated 12/24/2013  FINDINGS: Low lung volumes. There is prominence of the interstitial markings. Diffuse bilateral pulmonary opacities are appreciated. No focal regions of consolidation appreciated. The cardiac silhouette and osseous structures are unremarkable.  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 No focal regions of consolidation.   Electronically Signed   By: Salome Holmes M.D.   On: 12/25/2013 08:46   Medications: Scheduled Meds: . cefTRIAXone (ROCEPHIN)  IV  2 g Intravenous Q24H  . docusate sodium  100 mg Oral BID  . enoxaparin (LOVENOX) injection  70 mg Subcutaneous QHS  . influenza vac split quadrivalent PF  0.5 mL Intramuscular Tomorrow-1000  . pneumococcal 23 valent vaccine  0.5 mL Intramuscular Tomorrow-1000  . polyethylene glycol  17 g Oral BID  . sodium chloride  3 mL Intravenous Q12H     Continuous Infusions: . sodium chloride 125 mL/hr at 12/26/13 0956   PRN Meds:.acetaminophen, albuterol, ondansetron (ZOFRAN) IV, ondansetron, oxyCODONE, polyvinyl alcohol Assessment/Plan: 23 y.o history of chronic asthma presents for sepsis likely 2/2 E. Coli pyelonephritis and interstitial pneumonitis of CXR.   #Sepsis likely 2/2 E. Coli causing right pyelonephritis  -Pt still with fever. BP improved  -with persistent fevers will consult ID (Dr. Luciana Axeomer) for opinion.  Dr. Luciana Axeomer expects to see fevers in pyelo.   -Started on Rocephin then changed to vancomycin and Zosyn. Now on Rocephin only which cultures are sensitive to but keeps spiking fever.  Will change back to Zosyn  -repeat CT scan ab/pelvis with contrast with w/u for complications of pyelo, Ddx also includes appendicitis possibility with + Rovsings sign today though 1/27 imaging was negative for appendicitis  -Blood cultures NTD and pending  #Interstitial pneumonitis/History of asthma -asthma stable. HIV negative.  Pt seems clinically stable from a pulm standpoint     #F/E/N -NS 125 cc/hr  -Hypokalemia K 3.6 today replaced with 40 meQ -regular diet changed to bland diet  #DVT Px  -Lovenox, scds   Dispo: Disposition is deferred at this time, awaiting improvement of current medical problems.  Anticipated discharge in approximately 1-2 day(s).   The patient does not have a current PCP (No primary provider on file.) and [facility]. Will ask where wants to establish care.    The patient does not have transportation limitations that hinder transportation to clinic appointments.  .Services Needed at time of discharge: Y = Yes, Blank = No PT:   OT:   RN:   Equipment:   Other:     LOS: 3 days   Annett Gularacy N [place], MD [phone] 12/27/2013, 8:04 AM

## 2013-12-27 NOTE — Progress Notes (Addendum)
ANTIBIOTIC CONSULT NOTE - INITIAL  Pharmacy Consult for zosyn Indication: pyelonephritis  No Known Allergies  Patient Measurements: Height: 5\' 11"  (180.3 cm) Weight: 324 lb 1.2 oz (147 kg) IBW/kg (Calculated) : 70.8  Vital Signs: Temp: 101.3 F (38.5 C) (01/30 0500) Temp src: Oral (01/30 0500) BP: 125/72 mmHg (01/30 0500) Pulse Rate: 93 (01/30 0500) Intake/Output from previous day: 01/29 0701 - 01/30 0700 In: 960 [P.O.:960] Out: 803 [Urine:802; Stool:1] Intake/Output from this shift: Total I/O In: -  Out: 101 [Urine:100; Stool:1]  Labs:  Recent Labs  12/25/13 0402 12/26/13 0538 12/26/13 1039 12/27/13 0535  WBC 10.6* 9.8 9.8 8.6  HGB 10.6* 10.6* 10.1* 10.8*  PLT 225 218 210 254  CREATININE 1.10 0.87  --  0.90   Estimated Creatinine Clearance: 155.5 ml/min (by C-G formula based on Cr of 0.9). No results found for this basename: VANCOTROUGH, VANCOPEAK, VANCORANDOM, GENTTROUGH, GENTPEAK, GENTRANDOM, TOBRATROUGH, TOBRAPEAK, TOBRARND, AMIKACINPEAK, AMIKACINTROU, AMIKACIN,  in the last 72 hours   Microbiology: Urine cx (+) e.coli  Medical History: Past Medical History  Diagnosis Date  . Asthma     Medications:  Scheduled:  . docusate sodium  100 mg Oral BID  . enoxaparin (LOVENOX) injection  70 mg Subcutaneous QHS  . influenza vac split quadrivalent PF  0.5 mL Intramuscular Tomorrow-1000  . pneumococcal 23 valent vaccine  0.5 mL Intramuscular Tomorrow-1000  . polyethylene glycol  17 g Oral BID  . sodium chloride  3 mL Intravenous Q12H   Infusions:  . sodium chloride 125 mL/hr at 12/26/13 [id number]   Assessment: 23 yo morbidly obese F originally presenting with R sided flank pain and burning with urination. Cultures and sensitivities reveal E.coli >100k pan-sensitive except resistant to ampicillin.  Patient is still febrile and WBC has trended down to 8.6.  SCr is trending down to 0.9 with an estimated CrCl >100.  Of note, patient is 147 kg with a BMI ~45.  Pt was  started on zosyn, then narrowed to ceftriaxone, now re-broadening to zosyn due to persistent fevers and complaints of flank pain.  Goal of Therapy:  Eradication of infection  Plan:  Zosyn 3.375 IV q8h (4 hour infusion) Monitor fevers, s/sx of infection  Agapito GamesAlison Jakaree Pickard, PharmD, BCPS Clinical Pharmacist 12/27/2013 11:50 AM

## 2013-12-28 LAB — BASIC METABOLIC PANEL
BUN: 5 mg/dL — ABNORMAL LOW (ref 6–23)
CALCIUM: 7.8 mg/dL — AB (ref 8.4–10.5)
CO2: 23 meq/L (ref 19–32)
Chloride: 100 mEq/L (ref 96–112)
Creatinine, Ser: 0.75 mg/dL (ref 0.50–1.10)
Glucose, Bld: 113 mg/dL — ABNORMAL HIGH (ref 70–99)
Potassium: 3.7 mEq/L (ref 3.7–5.3)
SODIUM: 137 meq/L (ref 137–147)

## 2013-12-28 LAB — CLOSTRIDIUM DIFFICILE BY PCR: CDIFFPCR: NEGATIVE

## 2013-12-28 MED ORDER — LEVOFLOXACIN 750 MG PO TABS: 750.0000 mg | ORAL_TABLET | Freq: Every day | ORAL | Status: DC

## 2013-12-28 MED ORDER — LEVOFLOXACIN 750 MG PO TABS
750.0000 mg | ORAL_TABLET | Freq: Every day | ORAL | Status: DC
  Administered 2013-12-28 – 2013-12-29 (×2): 750 mg via ORAL
  Filled 2013-12-28 (×2): qty 1

## 2013-12-28 NOTE — Progress Notes (Addendum)
Subjective: Patient seen and examined at the bedside this morning. She says she feels better. She was able to eat some cereal this morning. Her abdominal pain has improved. She denies nausea, vomiting, diarrhea, constipation. She had a normal bowel movement this morning. She denies fevers or chills.  Objective: Vital signs in last 24 hours: Filed Vitals:   12/27/13 1324 12/27/13 1600 12/27/13 2010 12/28/13 0533  BP: 151/67 116/55 112/53 122/21  Pulse: 88 70 85 91  Temp: 98.7 F (37.1 C) 98.5 F (36.9 C) 98.5 F (36.9 C) 99.6 F (37.6 C)  TempSrc: Oral Oral Oral Oral  Resp: 20 18 18 18   Height:      Weight:      SpO2: 90% 92% 99% 94%   Weight change:   Intake/Output Summary (Last 24 hours) at 12/28/13 0952 Last data filed at 12/27/13 1845  Gross per 24 hour  Intake   1700 ml  Output      0 ml  Net   1700 ml   Vitals reviewed. General: resting in bed, NAD HEENT: [place]/at, no scleral icterus Cardiac: RRR, no rubs, murmurs or gallops Pulm: clear to auscultation bilaterally, no wheezes, rales, or rhonchi Abd: soft, obese, right CVA ttp, pain only with deep palpation of RLQ and LLQ, nondistended, BS present.  Ext: warm and well perfused, no pedal edema Neuro: alert and oriented X3, cranial nerves II-XII grossly intact  Lab Results: Basic Metabolic Panel:  Recent Labs Lab 12/26/13 0538 12/27/13 0535 12/27/13 1047  NA 138 137  --   K 3.9 3.6*  --   CL 104 99  --   CO2 22 23  --   GLUCOSE 104* 100*  --   BUN 6 5*  --   CREATININE 0.87 0.90  --   CALCIUM 7.3* 8.0*  --   MG  --   --  1.9   Liver Function Tests:  Recent Labs Lab 12/24/13 1200  AST 17  ALT 15  ALKPHOS 81  BILITOT 1.0  PROT 7.3  ALBUMIN 3.2*    Recent Labs Lab 12/24/13 1200  LIPASE 14   CBC:  Recent Labs Lab 12/26/13 1039 12/27/13 0535  WBC 9.8 8.6  NEUTROABS 6.7 6.5  HGB 10.1* 10.8*  HCT 31.7* 33.5*  MCV 88.1 87.7  PLT 210 254   Urinalysis:  Recent Labs Lab 12/24/13 1510    COLORURINE AMBER*  LABSPEC 1.021  PHURINE 6.0  GLUCOSEU NEGATIVE  HGBUR LARGE*  BILIRUBINUR NEGATIVE  KETONESUR NEGATIVE  PROTEINUR 100*  UROBILINOGEN 1.0  NITRITE POSITIVE*  LEUKOCYTESUR MODERATE*   Misc. Labs: none  Micro Results: Recent Results (from the past 240 hour(s))  WET PREP, GENITAL     Status: None   Collection Time    12/24/13  3:08 PM      Result Value Range Status   Yeast Wet Prep HPF POC NONE SEEN  NONE SEEN Final   Trich, Wet Prep NONE SEEN  NONE SEEN Final   Clue Cells Wet Prep HPF POC NONE SEEN  NONE SEEN Final   WBC, Wet Prep HPF POC NONE SEEN  NONE SEEN Final  GC/CHLAMYDIA PROBE AMP     Status: None   Collection Time    12/24/13  3:08 PM      Result Value Range Status   CT Probe RNA NEGATIVE  NEGATIVE Final   GC Probe RNA NEGATIVE  NEGATIVE Final   Comment: (NOTE)                                                                                               **  Normal Reference Range: Negative**          Assay performed using the Gen-Probe APTIMA COMBO2 (R) Assay.     Acceptable specimen types for this assay include APTIMA Swabs (Unisex,     endocervical, urethral, or vaginal), first void urine, and ThinPrep     liquid based cytology samples.     Performed at [facility]  URINE CULTURE     Status: None   Collection Time    12/24/13  3:10 PM      Result Value Range Status   Specimen Description URINE, CLEAN CATCH   Final   Special Requests NONE ADDED AT 1624   Final   Culture  Setup Time     Final   Value: 12/24/2013 20:55     Performed at [facility]   Colony Count     Final   Value: >=100,000 COLONIES/ML     Performed at [facility]   Culture     Final   Value: ESCHERICHIA COLI     Performed at [facility]   Report Status 12/25/2013 FINAL   Final   Organism ID, Bacteria ESCHERICHIA COLI   Final  CULTURE, BLOOD (ROUTINE X 2)     Status: None   Collection Time    12/24/13  5:30 PM      Result Value Range  Status   Specimen Description BLOOD ARM LEFT   Final   Special Requests BOTTLES DRAWN AEROBIC ONLY 10CC   Final   Culture  Setup Time     Final   Value: 12/24/2013 22:38     Performed at [facility]   Culture     Final   Value:        BLOOD CULTURE RECEIVED NO GROWTH TO DATE CULTURE WILL BE HELD FOR 5 DAYS BEFORE ISSUING A FINAL NEGATIVE REPORT     Performed at [facility]   Report Status PENDING   Incomplete  CULTURE, BLOOD (ROUTINE X 2)     Status: None   Collection Time    12/24/13  5:35 PM      Result Value Range Status   Specimen Description BLOOD HAND LEFT   Final   Special Requests BOTTLES DRAWN AEROBIC ONLY 10CC   Final   Culture  Setup Time     Final   Value: 12/24/2013 22:40     Performed at [facility]   Culture     Final   Value:        BLOOD CULTURE RECEIVED NO GROWTH TO DATE CULTURE WILL BE HELD FOR 5 DAYS BEFORE ISSUING A FINAL NEGATIVE REPORT     Performed at [facility]   Report Status PENDING   Incomplete  CLOSTRIDIUM DIFFICILE BY PCR     Status: None   Collection Time    12/27/13  9:11 PM      Result Value Range Status   C difficile by pcr NEGATIVE  NEGATIVE Final   Studies/Results: Ct Abdomen Pelvis W Contrast  12/27/2013   CLINICAL DATA:  Right lower quadrant abdominal pain. Right flank pain. Nausea and vomiting. Diarrhea. Patient currently being treated with IV antibiotics for right pyelonephritis.  EXAM: CT ABDOMEN AND PELVIS WITH CONTRAST  TECHNIQUE: Multidetector CT imaging of the abdomen and pelvis was performed using the standard protocol following bolus administration of intravenous contrast.  CONTRAST:  100mL OMNIPAQUE IOHEXOL 300 MG/ML IV. Oral contrast was also administered.  COMPARISON:  CT  abdomen and pelvis 3 days ago on 12/24/2013.  FINDINGS: Since the examination 3 days ago, interval development of simple free fluid in the pelvic midline, situated between the ovaries and just above the uterus. Collapsing functional  cyst with an enhancing wall in the right ovary, better seen currently. No dominant ovarian cyst identified. Normal-appearing uterus. Urinary bladder decompressed and unremarkable.  Normal-appearing liver with an anatomic variant in that the left lobe extends well across the midline into the left upper quadrant. Normal spleen, pancreas, adrenal glands, left kidney, and gallbladder. No biliary duct or dilation. Interval improvement in the right renal edema and right perinephric edema, without evidence of abscess. No visible atherosclerosis. No significant lymphadenopathy.  Normal appearing stomach and small bowel. Wall thickening involving the entire colon from cecum to the mid rectum, new since the prior examination 3 days ago.  Bone window images unremarkable. New focal airspace consolidation in the medial right lower lobe and patchy airspace opacities in the deep left lower lobe. Heart size upper normal to slightly enlarged.  IMPRESSION: 1. Wall thickening involving the entire colon from the cecum to the mid rectum, with sparing of the distal rectum. In the setting of IV antibiotic therapy, C difficile colitis should be excluded. 2. Small amount of free fluid in the midline of the pelvis. As the fluid is situated between the ovaries, it may be related to recent ovarian cyst rupture. It may also be secondary to the presumed colitis. 3. Interval improvement in the right renal edema and perinephric edema. No evidence of renal or perirenal abscess. 4. New airspace consolidation deep in the medial right lower lobe and patchy airspace opacities in the deep left lower lobe, query pneumonia. 5. Borderline heart size.   Electronically Signed   By: Hulan Saas M.D.   On: 12/27/2013 18:10   Medications: Scheduled Meds: . docusate sodium  100 mg Oral BID  . enoxaparin (LOVENOX) injection  70 mg Subcutaneous QHS  . influenza vac split quadrivalent PF  0.5 mL Intramuscular Tomorrow-1000  . piperacillin-tazobactam  (ZOSYN)  IV  3.375 g Intravenous Q8H  . pneumococcal 23 valent vaccine  0.5 mL Intramuscular Tomorrow-1000  . polyethylene glycol  17 g Oral BID  . sodium chloride  3 mL Intravenous Q12H   Continuous Infusions: . sodium chloride 125 mL/hr at 12/26/13 0956   PRN Meds:.acetaminophen, albuterol, ondansetron (ZOFRAN) IV, ondansetron, oxyCODONE, polyvinyl alcohol Assessment/Plan: 23 y.o history of chronic asthma presents for sepsis likely 2/2 E. Coli pyelonephritis and interstitial pneumonitis of CXR.   #Sepsis likely 2/2 E. Coli causing right pyelonephritis  - AVSS - Started on Rocephin then changed to vancomycin and Zosyn. Then changed back to Rocephin only (urine cultures pan sensitive) but this was escalated to Zosyn on TID on 1/30 2/2 spiking fevers. Today she feels better and no fevers overnight or this morning. - Starting Levaquin 750 mg by mouth today at 2 PM, plan for 5 doses, end date would be 01/01/14. - Repeat CT scan ab/pelvis with contrast negative for appendicitis. Shows colon wall thickening, however C. difficile PCR was negative and patient denies diarrhea. It did show a new airspace consolidation in medial right lower lobe and deep left lower lobe. Pt denies cough. This is why Levaquin was chosen, respiratory flora quinolone. - Blood cultures NGTD - If afebrile for 48 hours we can plan to d/c tomorrow on po Levaquin  #Interstitial pneumonitis/History of asthma - Asthma stable. HIV negative.  Pt seems clinically stable from a pulm standpoint     #  F/E/N -NS 125 cc/hr  - K 3.7 -regular diet changed to bland diet, patient tolerating well  #DVT Px  -Lovenox, scds   Dispo: Disposition is deferred at this time, awaiting improvement of current medical problems.  Anticipated discharge in approximately 1-2 day(s).   The patient does not have a current PCP (No primary provider on file.) and does need an [facility] hospital follow-up appointment after discharge. Will ask where wants to  establish care.    The patient does not have transportation limitations that hinder transportation to clinic appointments.  .Services Needed at time of discharge: Y = Yes, Blank = No PT:   OT:   RN:   Equipment:   Other:     LOS: 4 days   Vivi Barrack, MD [phone]  12/28/2013, 9:52 AM

## 2013-12-28 NOTE — Discharge Summary (Signed)
Name: Natalie Ramsey MRN: [id number] DOB: November 07, 1991 23 y.o. PCP: No primary provider on file.  Date of Admission: 12/24/2013 11:35 AM Date of Discharge: 12/29/2013 Attending Physician: Dr Janene Harvey Discharge Diagnosis: Principal Problem:   Sepsis Active Problems:   Pyelonephritis   Asthma, chronic   Hypokalemia   Interstitial pneumonitis   Fever, unspecified  Discharge Medications:   Medication List         acetaminophen 325 MG tablet  Commonly known as:  TYLENOL  Take 2 tablets (650 mg total) by mouth every 6 (six) hours as needed for mild pain or fever.     albuterol 108 (90 BASE) MCG/ACT inhaler  Commonly known as:  PROVENTIL HFA;VENTOLIN HFA  Inhale into the lungs every 6 (six) hours as needed for wheezing or shortness of breath.     DSS 100 MG Caps  Take 100 mg by mouth 2 (two) times daily.     levofloxacin 750 MG tablet  Commonly known as:  LEVAQUIN  Take 1 tablet (750 mg total) by mouth daily.     NYQUIL PO  Take 2 capsules by mouth every 4 (four) hours as needed.        Disposition and follow-up:   [name] was discharged from [facility] in Stable condition.  At the hospital follow up visit please address:  1.  Reexamined the patient to insure complete resolution of signs of right pyelonephritis. Also confirm compliance with antibiotics.  2.  Labs / imaging needed at time of follow-up: BMP (K)  3.  Pending labs/ test needing follow-up: None  Follow-up Appointments: Follow-up Information   Follow up with [facility] In 4 days.   Contact information:   [address] [place] [address] [phone]      Discharge Instructions: Discharge Orders   Future Orders Complete By Expires   Diet - low sodium heart healthy  As directed    Increase activity slowly  As directed       Consultations:    Procedures Performed:  Dg Chest 2 View  12/25/2013   CLINICAL DATA:  Pneumonia, elevated fever,  body aches  EXAM: CHEST  2 VIEW  COMPARISON:  DG CHEST 1V PORT dated 12/24/2013  FINDINGS: Low lung volumes. There is prominence of the interstitial markings. Diffuse bilateral pulmonary opacities are appreciated. No focal regions of consolidation appreciated. The cardiac silhouette and osseous structures are unremarkable.  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 No focal regions of consolidation.   Electronically Signed   By: Salome Holmes M.D.   On: 12/25/2013 08:46   US Transvaginal Non-ob  12/24/2013   CLINICAL DATA:  Fever and right lower quadrant discomfort, possible tubo-ovarian abscess.  EXAM: TRANSABDOMINAL AND TRANSVAGINAL ULTRASOUND OF PELVIS  TECHNIQUE: Both transabdominal and transvaginal ultrasound examinations of the pelvis were performed. Transabdominal technique was performed for global imaging of the pelvis including uterus, ovaries, adnexal regions, and pelvic cul-de-sac. It was necessary to proceed with endovaginal exam following the transabdominal exam to visualize the endometrium and adnexal structures.  COMPARISON:  None  FINDINGS: Uterus  Measurements: 6.7 x 3.2 x 4.3 cm. No fibroids or other mass visualized.  Endometrium  Thickness: 4.2 mm.  No focal abnormality visualized.  Right ovary  Measurements: 3.8 x 2.6 x 1.8 cm. Normal appearance/no adnexal mass.  Left ovary  Measurements: 2.7 x 1.7 x 1.5 cm. Normal appearance/no adnexal mass.  Other findings  There is no free  fluid or complex fluid collection in the pelvis. There is echogenic material within the urinary bladder.  IMPRESSION: 1. The uterus is normal in appearance. 2. The ovaries and adnexal structures exhibit no acute abnormalities. 3. Echogenic debris within the urinary bladder may be sterile cellular debris or could reflect cystitis or blood products. 4. If there are clinical concerns of possible acute appendicitis, CT scanning would be a useful next imaging step.    Electronically Signed   By: David  [place]   On: 12/24/2013 16:05   US Pelvis Complete  12/24/2013   CLINICAL DATA:  Fever and right lower quadrant discomfort, possible tubo-ovarian abscess.  EXAM: TRANSABDOMINAL AND TRANSVAGINAL ULTRASOUND OF PELVIS  TECHNIQUE: Both transabdominal and transvaginal ultrasound examinations of the pelvis were performed. Transabdominal technique was performed for global imaging of the pelvis including uterus, ovaries, adnexal regions, and pelvic cul-de-sac. It was necessary to proceed with endovaginal exam following the transabdominal exam to visualize the endometrium and adnexal structures.  COMPARISON:  None  FINDINGS: Uterus  Measurements: 6.7 x 3.2 x 4.3 cm. No fibroids or other mass visualized.  Endometrium  Thickness: 4.2 mm.  No focal abnormality visualized.  Right ovary  Measurements: 3.8 x 2.6 x 1.8 cm. Normal appearance/no adnexal mass.  Left ovary  Measurements: 2.7 x 1.7 x 1.5 cm. Normal appearance/no adnexal mass.  Other findings  There is no free fluid or complex fluid collection in the pelvis. There is echogenic material within the urinary bladder.  IMPRESSION: 1. The uterus is normal in appearance. 2. The ovaries and adnexal structures exhibit no acute abnormalities. 3. Echogenic debris within the urinary bladder may be sterile cellular debris or could reflect cystitis or blood products. 4. If there are clinical concerns of possible acute appendicitis, CT scanning would be a useful next imaging step.   Electronically Signed   By: David  [place]   On: 12/24/2013 16:05   Ct Abdomen Pelvis W Contrast  12/27/2013   CLINICAL DATA:  Right lower quadrant abdominal pain. Right flank pain. Nausea and vomiting. Diarrhea. Patient currently being treated with IV antibiotics for right pyelonephritis.  EXAM: CT ABDOMEN AND PELVIS WITH CONTRAST  TECHNIQUE: Multidetector CT imaging of the abdomen and pelvis was performed using the standard protocol following bolus administration of  intravenous contrast.  CONTRAST:  OMNIPAQUE IOHEXOL 300 MG/ML IV. Oral contrast was also administered.  COMPARISON:  CT abdomen and pelvis 3 days ago on 12/24/2013.  FINDINGS: Since the examination 3 days ago, interval development of simple free fluid in the pelvic midline, situated between the ovaries and just above the uterus. Collapsing functional cyst with an enhancing wall in the right ovary, better seen currently. No dominant ovarian cyst identified. Normal-appearing uterus. Urinary bladder decompressed and unremarkable.  Normal-appearing liver with an anatomic variant in that the left lobe extends well across the midline into the left upper quadrant. Normal spleen, pancreas, adrenal glands, left kidney, and gallbladder. No biliary duct or dilation. Interval improvement in the right renal edema and right perinephric edema, without evidence of abscess. No visible atherosclerosis. No significant lymphadenopathy.  Normal appearing stomach and small bowel. Wall thickening involving the entire colon from cecum to the mid rectum, new since the prior examination 3 days ago.  Bone window images unremarkable. New focal airspace consolidation in the medial right lower lobe and patchy airspace opacities in the deep left lower lobe. Heart size upper normal to slightly enlarged.  IMPRESSION: 1. Wall thickening involving the entire colon from the cecum  to the mid rectum, with sparing of the distal rectum. In the setting of IV antibiotic therapy, C difficile colitis should be excluded. 2. Small amount of free fluid in the midline of the pelvis. As the fluid is situated between the ovaries, it may be related to recent ovarian cyst rupture. It may also be secondary to the presumed colitis. 3. Interval improvement in the right renal edema and perinephric edema. No evidence of renal or perirenal abscess. 4. New airspace consolidation deep in the medial right lower lobe and patchy airspace opacities in the deep left lower  lobe, query pneumonia. 5. Borderline heart size.   Electronically Signed   By: Hulan Saas M.D.   On: 12/27/2013 18:10   Ct Abdomen Pelvis W Contrast  12/24/2013   CLINICAL DATA:  Right lower quadrant pain with nausea and vomiting for 4 days.  EXAM: CT ABDOMEN AND PELVIS WITH CONTRAST  TECHNIQUE: Multidetector CT imaging of the abdomen and pelvis was performed using the standard protocol following bolus administration of intravenous contrast.  CONTRAST:  OMNIPAQUE IOHEXOL 300 MG/ML  SOLN  COMPARISON:  None.  FINDINGS: Lung Bases: Dependent atelectasis is present at at both lung bases.  Liver:  Normal.  Spleen:  Normal.  Gallbladder:  Normal.  Common bile duct:  Normal.  Pancreas:  Normal.  Adrenal glands:  Normal.  Kidneys: The left kidney and ureter appear normal. The right kidney demonstrates decreased enhancement in the lower pole band upper to interpolar region compatible with pyelonephritis. Correlation with urinalysis is recommended. This is best appreciated on the sagittal and coronal reconstructed images.  Stomach:  Normal.  Small bowel:  Normal.  Colon:   Normal appendix.  No inflammatory changes of colon.  Pelvic Genitourinary: Normal appearance of the uterus and adnexa. Urinary bladder is within normal limits.  Bones:  Normal.  Vasculature: Normal.  Body Wall: Normal.  IMPRESSION: Right pyelonephritis.   Electronically Signed   By: Andreas Newport M.D.   On: 12/24/2013 18:57   Dg Chest Port 1 View  (if Code Sepsis Called)  12/24/2013   CLINICAL DATA:  Fever.  EXAM: PORTABLE CHEST - 1 VIEW  COMPARISON:  09/10/2007.  FINDINGS: Stable cardiac and mediastinal contours, enlarged, given differences in technique. Low lung volumes. No consolidative pulmonary opacities. Pulmonary vascular redistribution. No pleural effusion pneumothorax.  IMPRESSION: Mild cardiomegaly.  Pulmonary vascular redistribution.   Electronically Signed   By: Annia Belt M.D.   On: 12/24/2013 12:12    Admission HPI:    Natalie Ramsey is a 23 y.o. woman with pmhx of asthma who present with a cc of right sided flank pain. The patient was in her normal state of health until 4 days ago when she developed right sided flank pain. This pain is sharp. It does not radiate. She has associated symptoms for fevers, chills, nausea and vomiting since four days ago. She has not been able to keep food down during the last 3 days but has been able to drink water without problems. Today she began to feel woozy and dizzy while walking. The patient denies changes in bowel habits. She admits to burning with urination that has been present for years and has been unchanged recently. She denies vaginal discharge or abnormal bleeding. Her LMP was just before christmas. Her periods are sometimes irregular. She has had 1 sexual partner over the last 6 months. She has had 7 total sexual partners in her life. She reports that she is a lesbian and  does not use contraception or birth control at this times. She admits to mild dyspareunia.   Of note, the patient developed SOB and cough over the last 24 hours. Her cough is productive of clear sputum. She denies sick contacts and body aches.   Physical Exam:  Blood pressure 120/63, pulse 68, temperature 98.9 F (37.2 C), temperature source Oral, resp. rate 20, last menstrual period 11/17/2013, SpO2 100.00%.  Physical Exam  Constitutional: She is oriented to person, place, and time. She appears well-developed and well-nourished. No distress.  HENT:  Head: Normocephalic.  Mouth/Throat: Oropharynx is clear and moist. No oropharyngeal exudate.  Cardiovascular: Normal rate, regular rhythm and intact distal pulses. Exam reveals no gallop and no friction rub.  Murmur heard. Systolic crescendo decrescendo murmur 2/6 heard best in the LUSB.  Pulmonary/Chest: Effort normal. No respiratory distress. She has wheezes. She has no rales. She exhibits no tenderness.  Abdominal: Soft. Bowel sounds are normal. She  exhibits no distension. There is tenderness. There is no rebound and no guarding.  Mild tenderness in right flank and RLQ  Genitourinary:  Pelvic performed by ED team.  Musculoskeletal: She exhibits no edema and no tenderness.  Mild Right sided CVA tenderness.  Neurological: She is alert and oriented to person, place, and time.  Skin: She is not diaphoretic.  Psychiatric: She has a normal mood and affect. Her behavior is normal.   Hospital Course by problem list:  1. Sepsis 2/2 pyelonephritis - Patient presented with flank pain and right CVA tenderness which correlated with a right pyelonephritis on CT imaging. We considered pelvic inflammatory disease, but she had no cervical motion tenderness on pelvic exam and GC/Chlamydia was negative. Fluid resuscitation was provided with good blood pressure response. She was initially started on Rocephin, but this was changed to vancomycin and Zosyn on 1/29. When urine cultures showed sensitivity to Rocephin, she was placed back on this as monotherapy. However, on 1/30 the Rocephin was escalated to Zosyn 3.375 g 3 times a day because she continued to spike fevers as high as 102.3. ID was consulted, they told us that fevers on antibiotics was a common phenomenon in pyelonephritis. Nonetheless we repeated her CT scan on 1/30 as she had a positive Rovsing sign on exam we were concerned about abscess versus appendicitis. Scan was negative for appendicitis. Instead it showed colon wall thickening, however C. difficile PCR was negative and patient denied diarrhea. Incidentally it also showed a new airspace consolidation in medial right lower lobe and deep left lower lobe, though patient denied cough. We transitioned her to oral Levaquin 750 mg by mouth on 1/31. Upon discharge the patient was afebrile x48 hours, felt well, and was tolerating a diet. She will continue the Levaquin for a planned five-day course, end date being 01/01/14 ( which altogether be a 10 days course  of antibiotic). A message was sent to the front desk to contact the patient for an appointment since she was discharged on the weekend. Also, the patient was requested to call the clinic and the phone number was provided, in order to make herself an appointment and be evaluated within 4-5 days.  2. Asthma, chronic and Interstitial pneumonitis - Bilateral wheezing was noted on admission physical exam. This improved with albuterol nebs every 2 hours as needed. Flu panel was negative. Admission chest x-ray negative for pneumonia. HIV negative. Patient was clinical stable from a pulmonary standpoint during her admission.  3. Mild cardiomegaly - Patient with "mild cardiomegaly" on portable CXR,  EKG wnl with QTc 415, no evidence of cardiomegaly on upright CXR. Likely not clinically significant.  4. Hypokalemia - patient received 2 doses of K-Dur for potassium of 3.6 x2. Within normal limits at discharge.  Discharge Vitals:   BP 130/74  Pulse 69  Temp(Src) 98.8 F (37.1 C) (Oral)  Resp 20  Ht 5\' 11"  (1.803 m)  Wt 324 lb 1.2 oz (147 kg)  BMI 45.22 kg/m2  SpO2 98%  LMP 11/17/2013  Discharge Labs:  No results found for this or any previous visit (from the past 24 hour(s)).  Signed: Dow Adolphichard Gibril Mastro, MD 12/29/2013, 1:38 PM   Time Spent on Discharge: 35 minutes Services Ordered on Discharge: None Equipment Ordered on Discharge: None

## 2013-12-29 ENCOUNTER — Telehealth: Payer: Self-pay | Admitting: Internal Medicine

## 2013-12-29 MED ORDER — DSS 100 MG PO CAPS: 100.0000 mg | ORAL_CAPSULE | Freq: Two times a day (BID) | ORAL | Status: DC

## 2013-12-29 MED ORDER — LEVOFLOXACIN 750 MG PO TABS
750.0000 mg | ORAL_TABLET | Freq: Every day | ORAL | Status: AC
Start: 2013-12-29 — End: 2014-01-01

## 2013-12-29 MED ORDER — ACETAMINOPHEN 325 MG PO TABS: 650.0000 mg | ORAL_TABLET | Freq: Four times a day (QID) | ORAL | Status: DC | PRN

## 2013-12-29 NOTE — Progress Notes (Signed)
Subjective: She feels better today. No new complaints today.   Objective: Vital signs in last 24 hours: Filed Vitals:   12/28/13 0533 12/28/13 1340 12/28/13 2100 12/29/13 0500  BP: 122/21 116/75 126/66 130/74  Pulse: 91 90 71 69  Temp: 99.6 F (37.6 C) 98.1 F (36.7 C) 99.5 F (37.5 C) 98.8 F (37.1 C)  TempSrc: Oral Oral    Resp: 18 18 18 20   Height:      Weight:      SpO2: 94% 92% 96% 98%   Weight change:   Intake/Output Summary (Last 24 hours) at 12/29/13 1105 Last data filed at 12/29/13 0500  Gross per 24 hour  Intake   1250 ml  Output    200 ml  Net   1050 ml   Vitals reviewed. General: resting in bed, NAD HEENT: [place]/at, no scleral icterus Cardiac: RRR, no rubs, murmurs or gallops Pulm: clear to auscultation bilaterally, no wheezes, rales, or rhonchi Abd: soft, obese, minimal right upper quadrant tenderness. nondistended, BS present.  Ext: warm and well perfused, no pedal edema Neuro: alert and oriented X3, cranial nerves II-XII grossly intact  Lab Results: Basic Metabolic Panel:  Recent Labs Lab 12/27/13 0535 12/27/13 1047 12/28/13 0845  NA 137  --  137  K 3.6*  --  3.7  CL 99  --  100  CO2 23  --  23  GLUCOSE 100*  --  113*  BUN 5*  --  5*  CREATININE 0.90  --  0.75  CALCIUM 8.0*  --  7.8*  MG  --  1.9  --    Liver Function Tests:  Recent Labs Lab 12/24/13 1200  AST 17  ALT 15  ALKPHOS 81  BILITOT 1.0  PROT 7.3  ALBUMIN 3.2*    Recent Labs Lab 12/24/13 1200  LIPASE 14   CBC:  Recent Labs Lab 12/26/13 1039 12/27/13 0535  WBC 9.8 8.6  NEUTROABS 6.7 6.5  HGB 10.1* 10.8*  HCT 31.7* 33.5*  MCV 88.1 87.7  PLT 210 254   Urinalysis:  Recent Labs Lab 12/24/13 1510  COLORURINE AMBER*  LABSPEC 1.021  PHURINE 6.0  GLUCOSEU NEGATIVE  HGBUR LARGE*  BILIRUBINUR NEGATIVE  KETONESUR NEGATIVE  PROTEINUR 100*  UROBILINOGEN 1.0  NITRITE POSITIVE*  LEUKOCYTESUR MODERATE*   Misc. Labs: none  Micro Results: Recent Results  (from the past 240 hour(s))  WET PREP, GENITAL     Status: None   Collection Time    12/24/13  3:08 PM      Result Value Range Status   Yeast Wet Prep HPF POC NONE SEEN  NONE SEEN Final   Trich, Wet Prep NONE SEEN  NONE SEEN Final   Clue Cells Wet Prep HPF POC NONE SEEN  NONE SEEN Final   WBC, Wet Prep HPF POC NONE SEEN  NONE SEEN Final  GC/CHLAMYDIA PROBE AMP     Status: None   Collection Time    12/24/13  3:08 PM      Result Value Range Status   CT Probe RNA NEGATIVE  NEGATIVE Final   GC Probe RNA NEGATIVE  NEGATIVE Final   Comment: (NOTE)                                                                                               **  Normal Reference Range: Negative**          Assay performed using the Gen-Probe APTIMA COMBO2 (R) Assay.     Acceptable specimen types for this assay include APTIMA Swabs (Unisex,     endocervical, urethral, or vaginal), first void urine, and ThinPrep     liquid based cytology samples.     Performed at [facility]  URINE CULTURE     Status: None   Collection Time    12/24/13  3:10 PM      Result Value Range Status   Specimen Description URINE, CLEAN CATCH   Final   Special Requests NONE ADDED AT 1624   Final   Culture  Setup Time     Final   Value: 12/24/2013 20:55     Performed at [facility] Count     Final   Value: >=100,000 COLONIES/ML     Performed at [facility]   Culture     Final   Value: ESCHERICHIA COLI     Performed at [facility]   Report Status 12/25/2013 FINAL   Final   Organism ID, Bacteria ESCHERICHIA COLI   Final  CULTURE, BLOOD (ROUTINE X 2)     Status: None   Collection Time    12/24/13  5:30 PM      Result Value Range Status   Specimen Description BLOOD ARM LEFT   Final   Special Requests BOTTLES DRAWN AEROBIC ONLY 10CC   Final   Culture  Setup Time     Final   Value: 12/24/2013 22:38     Performed at [facility]   Culture     Final   Value:        BLOOD CULTURE  RECEIVED NO GROWTH TO DATE CULTURE WILL BE HELD FOR 5 DAYS BEFORE ISSUING A FINAL NEGATIVE REPORT     Performed at [facility]   Report Status PENDING   Incomplete  CULTURE, BLOOD (ROUTINE X 2)     Status: None   Collection Time    12/24/13  5:35 PM      Result Value Range Status   Specimen Description BLOOD HAND LEFT   Final   Special Requests BOTTLES DRAWN AEROBIC ONLY 10CC   Final   Culture  Setup Time     Final   Value: 12/24/2013 22:40     Performed at [facility]   Culture     Final   Value:        BLOOD CULTURE RECEIVED NO GROWTH TO DATE CULTURE WILL BE HELD FOR 5 DAYS BEFORE ISSUING A FINAL NEGATIVE REPORT     Performed at [facility]   Report Status PENDING   Incomplete  CULTURE, BLOOD (ROUTINE X 2)     Status: None   Collection Time    12/26/13 10:48 PM      Result Value Range Status   Specimen Description BLOOD RIGHT LOWER HAND   Final   Special Requests BOTTLES DRAWN AEROBIC ONLY [facility]   Final   Culture  Setup Time     Final   Value: 12/27/2013 04:06     Performed at [facility]   Culture     Final   Value:        BLOOD CULTURE RECEIVED NO GROWTH TO DATE CULTURE WILL BE HELD FOR 5 DAYS BEFORE ISSUING A FINAL NEGATIVE REPORT     Performed at [facility]  Report Status PENDING   Incomplete  CLOSTRIDIUM DIFFICILE BY PCR     Status: None   Collection Time    12/27/13  9:11 PM      Result Value Range Status   C difficile by pcr NEGATIVE  NEGATIVE Final   Studies/Results: Ct Abdomen Pelvis W Contrast  12/27/2013   CLINICAL DATA:  Right lower quadrant abdominal pain. Right flank pain. Nausea and vomiting. Diarrhea. Patient currently being treated with IV antibiotics for right pyelonephritis.  EXAM: CT ABDOMEN AND PELVIS WITH CONTRAST  TECHNIQUE: Multidetector CT imaging of the abdomen and pelvis was performed using the standard protocol following bolus administration of intravenous contrast.  CONTRAST:  OMNIPAQUE IOHEXOL 300  MG/ML IV. Oral contrast was also administered.  COMPARISON:  CT abdomen and pelvis 3 days ago on 12/24/2013.  FINDINGS: Since the examination 3 days ago, interval development of simple free fluid in the pelvic midline, situated between the ovaries and just above the uterus. Collapsing functional cyst with an enhancing wall in the right ovary, better seen currently. No dominant ovarian cyst identified. Normal-appearing uterus. Urinary bladder decompressed and unremarkable.  Normal-appearing liver with an anatomic variant in that the left lobe extends well across the midline into the left upper quadrant. Normal spleen, pancreas, adrenal glands, left kidney, and gallbladder. No biliary duct or dilation. Interval improvement in the right renal edema and right perinephric edema, without evidence of abscess. No visible atherosclerosis. No significant lymphadenopathy.  Normal appearing stomach and small bowel. Wall thickening involving the entire colon from cecum to the mid rectum, new since the prior examination 3 days ago.  Bone window images unremarkable. New focal airspace consolidation in the medial right lower lobe and patchy airspace opacities in the deep left lower lobe. Heart size upper normal to slightly enlarged.  IMPRESSION: 1. Wall thickening involving the entire colon from the cecum to the mid rectum, with sparing of the distal rectum. In the setting of IV antibiotic therapy, C difficile colitis should be excluded. 2. Small amount of free fluid in the midline of the pelvis. As the fluid is situated between the ovaries, it may be related to recent ovarian cyst rupture. It may also be secondary to the presumed colitis. 3. Interval improvement in the right renal edema and perinephric edema. No evidence of renal or perirenal abscess. 4. New airspace consolidation deep in the medial right lower lobe and patchy airspace opacities in the deep left lower lobe, query pneumonia. 5. Borderline heart size.    Electronically Signed   By: Hulan Saas M.D.   On: 12/27/2013 18:10   Medications: Scheduled Meds: . docusate sodium  100 mg Oral BID  . enoxaparin (LOVENOX) injection  70 mg Subcutaneous QHS  . influenza vac split quadrivalent PF  0.5 mL Intramuscular Tomorrow-1000  . levofloxacin  750 mg Oral Daily  . pneumococcal 23 valent vaccine  0.5 mL Intramuscular Tomorrow-1000  . polyethylene glycol  17 g Oral BID  . sodium chloride  3 mL Intravenous Q12H   Continuous Infusions: . sodium chloride 125 mL/hr at 12/28/13 1908   PRN Meds:.acetaminophen, albuterol, ondansetron (ZOFRAN) IV, ondansetron, oxyCODONE, polyvinyl alcohol Assessment/Plan: 23 y.o history of chronic asthma presents for sepsis likely 2/2 E. Coli pyelonephritis and interstitial pneumonitis of CXR.   #Sepsis likely 2/2 E. Coli causing right pyelonephritis: Patient presented and with symptoms and signs consistent with a right hilar nephritis. Urine culture was positive for Escherichia coli only resistant to penicillin. Initially started on Rocephin which  was later empirically broadened to vancomycin and Zosyn due to poor clinical response after 2 days of treatment. After obtaining a culture and sensitivity results from the urine, and, after she had been afebrile, she was again changed back to Rocephin. Unfortunately during her treatment course, she spiked fevers, and had physical examination findings, which we are concerning for appendicitis. Her antibiotics were again revised and Zosyn were started. An abdominal CT scan was obtained and revealed questionable recent ruptured ovarian cyst, however, was negative for acute appendicitis. Other findings included wall thickening involving the entire colon from the cecum to the mid rectum, with sparing of the distal rectum raising the concern of C. Difficile, however, she had a negative C. Difficile PCR. Her CT scan also showed interval improvement in the right renal edema and perinephric  edema. No evidence of renal or pararenal abscess was noted. After the patient had remained afebrile for more than 48 hours, her antibiotics were de-esclated to PO  Levaquin (which would also cover any ? pneumonia, which had been reported on chest imaging, though patient did not have signs/symptoms of PNA) to complete a 10 days course of antibiotics. She wil be discharged on  Levaquin 750 mg (end date would be 01/01/14). She will followup with our outpatient clinic in 4-5 days for reevaluation. I will send a message to the front desk to contact the patient for an appointment since she was discharged over the weekend.   #Interstitial pneumonitis/History of asthma - Asthma stable. HIV negative.  Pt seems clinically stable from a pulm standpoint     #F/E/N -NS 125 cc/hr  - K 3.7 -regular diet changed to bland diet, patient tolerating well  #DVT Px  -Lovenox, scds   Dispo: Disposition is deferred at this time, awaiting improvement of current medical problems.  Anticipated discharge in approximately 0 day(s).   The patient does not have a current PCP (No primary provider on file.) and [facility] after discharge. Will ask where wants to establish care.    The patient does not have transportation limitations that hinder transportation to clinic appointments.  .Services Needed at time of discharge: Y = Yes, Blank = No PT:   OT:   RN:   Equipment:   Other:     LOS: 5 days   Dow Adolphichard Tilman Mcclaren, MD [phone]  12/29/2013, 11:05 AM

## 2013-12-29 NOTE — Telephone Encounter (Signed)
A user error has taken place: encounter opened in error, closed for administrative reasons.

## 2013-12-29 NOTE — Discharge Instructions (Signed)
·   Thank you for allowing us to be involved in your healthcare while [facility] were [facility].   Please note that there have been changes to your home medications.  --> PLEASE LOOK AT YOUR DISCHARGE MEDICATION LIST FOR DETAILS.  It very important that you take you antibiotics (Levaquin 750 mg once daily until 01/01/2014).   Our clinic will call you tomorrow. If you don't hear from them, please call on Tuesday to make an appointment. Clinic number is [phone].   Please call your PCP if you have any questions or concerns, or any difficulty getting any of your medications.  Please return to the ER if you have worsening of your symptoms or new severe symptoms arise.  Urinary Tract Infection A urinary tract infection (UTI) can occur any place along the urinary tract. The tract includes the kidneys, ureters, bladder, and urethra. A type of germ called bacteria often causes a UTI. UTIs are often helped with antibiotic medicine.  HOME CARE  If given, take antibiotics as told by your doctor. Finish them even if you start to feel better. Drink enough fluids to keep your pee (urine) clear or pale yellow. Avoid tea, drinks with caffeine, and bubbly (carbonated) drinks. Pee often. Avoid holding your pee in for a long time. Pee before and after having sex (intercourse). Wipe from front to back after you poop (bowel movement) if you are a woman. Use each tissue only once. GET HELP RIGHT AWAY IF:  You have back pain. You have lower belly (abdominal) pain. You have chills. You feel sick to your stomach (nauseous). You throw up (vomit). Your burning or discomfort with peeing does not go away. You have a fever. Your symptoms are not better in 3 days. MAKE SURE YOU:  Understand these instructions. Will watch your condition. Will get help right away if you are not doing well or get worse. Document Released: 05/02/2008 Document Revised: 08/08/2012 Document Reviewed:  06/14/2012 [facility] Patient Information 2014 [place].

## 2013-12-30 LAB — CULTURE, BLOOD (ROUTINE X 2)
Culture: NO GROWTH
Culture: NO GROWTH

## 2014-01-02 ENCOUNTER — Ambulatory Visit (INDEPENDENT_AMBULATORY_CARE_PROVIDER_SITE_OTHER): Payer: BC Managed Care – PPO | Admitting: Internal Medicine

## 2014-01-02 ENCOUNTER — Encounter: Payer: Self-pay | Admitting: Internal Medicine

## 2014-01-02 VITALS — BP 102/75 | HR 94 | Temp 97.6°F | Ht 70.0 in | Wt 317.2 lb

## 2014-01-02 DIAGNOSIS — J45909 Unspecified asthma, uncomplicated: Secondary | ICD-10-CM

## 2014-01-02 DIAGNOSIS — N12 Tubulo-interstitial nephritis, not specified as acute or chronic: Secondary | ICD-10-CM

## 2014-01-02 DIAGNOSIS — Z0000 Encounter for general adult medical examination without abnormal findings: Secondary | ICD-10-CM | POA: Insufficient documentation

## 2014-01-02 DIAGNOSIS — E876 Hypokalemia: Secondary | ICD-10-CM

## 2014-01-02 LAB — CULTURE, BLOOD (ROUTINE X 2): Culture: NO GROWTH

## 2014-01-02 LAB — BASIC METABOLIC PANEL WITH GFR
BUN: 12 mg/dL (ref 6–23)
CO2: 24 mEq/L (ref 19–32)
Calcium: 9.2 mg/dL (ref 8.4–10.5)
Chloride: 101 mEq/L (ref 96–112)
Creat: 0.84 mg/dL (ref 0.50–1.10)
GFR, Est African American: >89 mL/min
Glucose, Bld: 80 mg/dL (ref 70–99)
Potassium: 4.6 mEq/L (ref 3.5–5.3)
SODIUM: 137 meq/L (ref 135–145)

## 2014-01-02 NOTE — Progress Notes (Signed)
Patient ID: Natalie Ramsey, female   DOB: 1991/02/28, 23 y.o.   MRN: [id number] Subjective:   Patient ID: Natalie Ramsey female   DOB: 1991/02/28 22 y.o.   MRN: [id number]  CC:  Hospital followup visit.  HPI:  [name] is a 23 y.o. man lady with past medical history as outlined below, who presents for a hospital followup visit today.  1. Sepsis 2/2 pyelonephritis: Patient was hospitalized from 01/27 to 12/29/13 because of sepsis secondary to pyelonephritis. Patient had CT with contrast in hospital which showed right pyelonephritis. She had negative GC and chlamydia probes and C. difficile PCR. Patient was a fluid resuscitated successfully in hospital. She was initially treated with ceftriaxone and then switched to Zosyn, and then further switched to Levaquin because of accidental finding of left lower lobe airspace consolidation. Patient did not have symptoms for pneumonia in hospital. Patient had positive  urine culture for Escherichia coli, which are sensitive to Levaquin. Her blood culture was negative. He responded well to the antibiotic treatment, she was discharged at stable condition on Levaquin for 5 days (total antibiotics treatment was 10 days). Today, she feels much better. Her symptoms are have resolved completely. She does not have dysuria, burning on urination or increased urinary frequency. She does not have flank pain. No fever or chills.  2. Asthma: ;patient had bilateral wheezing on physical examination on admission  This improved with albuterol nebs every 2 hours as needed. Flu panel was negative. Admission chest x-ray negative for pneumonia. HIV negative. Patient was clinical stable from a pulmonary standpoint during her admission. Today, she does not have cough, shortness of breath, chest pain or wheezing. Oxygen saturation is 98% on room air in clinic.   3 Hypokalemia - patient had hypokalemia in the hospital, which was repleted. K was within normal limits at  discharge.  ROS:  Denies fever, chills, fatigue, headaches,  cough, chest pain, SOB,  abdominal pain,diarrhea, constipation, dysuria, urgency, frequency, hematuria, joint pain or leg swelling.   Past Medical History  Diagnosis Date  . Asthma    Current Outpatient Prescriptions  Medication Sig Dispense Refill  . acetaminophen (TYLENOL) 325 MG tablet Take 2 tablets (650 mg total) by mouth every 6 (six) hours as needed for mild pain or fever.  15 tablet  0  . albuterol (PROVENTIL HFA;VENTOLIN HFA) 108 (90 BASE) MCG/ACT inhaler Inhale into the lungs every 6 (six) hours as needed for wheezing or shortness of breath.      . docusate sodium 100 MG CAPS Take 100 mg by mouth 2 (two) times daily.  10 capsule  0  . Pseudoeph-Doxylamine-DM-APAP (NYQUIL PO) Take 2 capsules by mouth every 4 (four) hours as needed.       No current facility-administered medications for this visit.   No family history on file. History   Social History  . Marital Status: Single    Spouse Name: N/A    Number of Children: N/A  . Years of Education: N/A   Social History Main Topics  . Smoking status: Current Some Day Smoker  . Smokeless tobacco: Not on file  . Alcohol Use: No  . Drug Use: No  . Sexual Activity: Not on file   Other Topics Concern  . Not on file   Social History Narrative  . No narrative on file   Review of Systems: Full 14-point review of systems otherwise negative. See HPI.  Objective:  Physical Exam: There were no vitals filed for this visit.  Constitutional: Vital signs reviewed.  Patient is a well-developed and well-nourished, in no acute distress and cooperative with exam.   HEENT:  Head: Normocephalic and atraumatic Mouth: no erythema or exudates, MMM Eyes: PERRL, EOMI, conjunctivae normal, No scleral icterus.  Neck: Supple, Trachea midline normal ROM, No JVD  Cardiovascular: RRR, Systolic crescendo decrescendo murmur 2/6 heard best in the LUSB.   Pulmonary/Chest: CTAB, no  wheezes, rales, or rhonchi Abdominal: Soft. Non-tender, non-distended, bowel sounds are normal, no masses, organomegaly, or guarding present.  GU: no CVA tenderness Musculoskeletal: No joint deformities, erythema, or stiffness, ROM full and non-tender Extremities: No leg edema Hematology: no cervical, inginal, or axillary adenopathy.  Neurological: A&O x3, Strength is normal and symmetric bilaterally, cranial nerve II-XII are grossly intact, no focal motor deficit, sensory intact to light touch bilaterally.  Skin: Warm, dry and intact. No rash, cyanosis, or clubbing.  Psychiatric: Normal mood and affect. No suicidal or homicidal ideation.  Assessment & Plan:

## 2014-01-02 NOTE — Assessment & Plan Note (Signed)
Patient is doing well, no fever, chills, shortness of breath, chest pain or wheezing. Her lung auscultation is clear bilaterally. She is currently using when necessary albuterol inhaler.  -will continue current regimen.

## 2014-01-02 NOTE — Patient Instructions (Signed)
1. Please take all medications as prescribed.  2. If you have worsening of your symptoms or new symptoms arise, please call the clinic ([phone]), or go to the ER immediately if symptoms are severe.  Please bring in all your medication bottles with you in next visit.    

## 2014-01-02 NOTE — Assessment & Plan Note (Signed)
Potassium level was normal at discharge, will repeat BMP today.

## 2014-01-02 NOTE — Assessment & Plan Note (Signed)
-  patient had pap smear on recent admission. She had negative GC/chlamydia test -patient refused the flu shot today, will postpone.

## 2014-01-02 NOTE — Assessment & Plan Note (Signed)
Patient completed antibiotic treatment for total of 10 days. Her symptoms have completely resolved. Today no fever, chills, flank pain, dysuria or burning sensation on urination. She feels much better.   -No further treatment needed.  -will check BMP for monitoring K level.

## 2014-01-06 ENCOUNTER — Telehealth: Payer: Self-pay | Admitting: *Deleted

## 2014-01-06 ENCOUNTER — Encounter: Payer: Self-pay | Admitting: Internal Medicine

## 2014-01-06 ENCOUNTER — Ambulatory Visit (INDEPENDENT_AMBULATORY_CARE_PROVIDER_SITE_OTHER): Payer: BC Managed Care – PPO | Admitting: Internal Medicine

## 2014-01-06 VITALS — BP 105/68 | HR 80 | Temp 98.3°F | Ht 71.0 in | Wt 321.7 lb

## 2014-01-06 DIAGNOSIS — J45909 Unspecified asthma, uncomplicated: Secondary | ICD-10-CM

## 2014-01-06 DIAGNOSIS — L26 Exfoliative dermatitis: Secondary | ICD-10-CM | POA: Insufficient documentation

## 2014-01-06 DIAGNOSIS — L538 Other specified erythematous conditions: Secondary | ICD-10-CM

## 2014-01-06 MED ORDER — DIPHENHYDRAMINE HCL 25 MG PO CAPS: 25.0000 mg | ORAL_CAPSULE | Freq: Four times a day (QID) | ORAL | Status: DC | PRN

## 2014-01-06 MED ORDER — ALBUTEROL SULFATE HFA 108 (90 BASE) MCG/ACT IN AERS: 2.0000 | INHALATION_SPRAY | Freq: Four times a day (QID) | RESPIRATORY_TRACT | Status: DC | PRN

## 2014-01-06 NOTE — Assessment & Plan Note (Addendum)
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 The etiology is likely due to recent antibiotic use. She used 3 antibiotics, including ceftriaxone, Zosyn and Levaquin. Not very sure which one caused this problem. The patient has to stopped taking these medications   -will treat with diphenhydramine 25 mg every 6 hours -Patient is instructed to emollient to moisture her skin well -patient is instructed to not drive while she is taking diphenhydramine.  -patient is instructed to come back the hospital if her symptoms get worse, then will consider to add steroid.

## 2014-01-06 NOTE — Telephone Encounter (Signed)
Received a call from pt's Mother, Okey DupreRose - # [phone] Pt seen in clinic on 2/5 for hfu - pyelonephritis  Pt was taking DSS 100 mg and Levaquin 750 mg at home.  Meds started on 12/29/13 On Saturday 2/7, pt noted very red face, they on Sunday noted several scabs in hair, face pealing and redness over entire body.     Pt c/o itching and painful.  Stopped med Sunday and has not taken any meds for reaction.  Denies SOB  We have appointment after lunch today.  Can this wait till then?

## 2014-01-06 NOTE — Progress Notes (Signed)
Case discussed with Dr. Niu at the time of the visit.  We reviewed the resident's history and exam and pertinent patient test results.  I agree with the assessment, diagnosis, and plan of care documented in the resident's note.    

## 2014-01-06 NOTE — Progress Notes (Signed)
Patient ID: Natalie Ramsey, female   DOB: 1991/10/22, 23 y.o.   MRN: [id number] Subjective:   Patient ID: Natalie Ramsey female   DOB: 1991/10/22 22 y.o.   MRN: [id number]  CC:  Acute visit due to pealing skin HPI:  [name] is a 23 y.o. lady with past medical history as outlined below, who present for an acute visit today.  The patient was hospitalized from 01/27 to 12/29/13 because of sepsis secondary to pyelonephritis. She was initially treated with ceftriaxone and then switched to Zosyn, and then further switched to Levaquin because of accidental finding of left lower lobe airspace consolidation. She was discharged at stable condition on Levaquin for 5 days (total antibiotics treatment was 10 days). She completed antibiotic treatment. Her pyelonephritis resolved in the followup visit on 01/02/14. Patient started feeling all over her body since 01/03/14, then she has peaking skin over her face, neck and back above the shoulder level. No blisters. Patient does not have fever, chills, lip swelling, shortness of breath. No tenderness.   ROS:  Denies fever, chills, fatigue, headaches, cough, chest pain, SOB,  abdominal pain, diarrhea, dysuria, urgency, frequency, hematuria, joint pain or leg swelling.  Past Medical History  Diagnosis Date  . Asthma    Current Outpatient Prescriptions  Medication Sig Dispense Refill  . acetaminophen (TYLENOL) 325 MG tablet Take 2 tablets (650 mg total) by mouth every 6 (six) hours as needed for mild pain or fever.  15 tablet  0  . albuterol (PROVENTIL HFA;VENTOLIN HFA) 108 (90 BASE) MCG/ACT inhaler Inhale into the lungs every 6 (six) hours as needed for wheezing or shortness of breath.      . docusate sodium 100 MG CAPS Take 100 mg by mouth 2 (two) times daily.  10 capsule  0   No current facility-administered medications for this visit.   No family history on file. History   Social History  . Marital Status: Single    Spouse Name: N/A    Number of  Children: N/A  . Years of Education: N/A   Social History Main Topics  . Smoking status: Former Games developermoker  . Smokeless tobacco: Not on file  . Alcohol Use: No  . Drug Use: No  . Sexual Activity: Not on file   Other Topics Concern  . Not on file   Social History Narrative  . No narrative on file    Review of Systems: Full 14-point review of systems otherwise negative. See HPI.   Objective:  Physical Exam: There were no vitals filed for this visit. Constitutional: Vital signs reviewed.  Patient is a well-developed and well-nourished, in no acute distress and cooperative with exam.   HEENT:  Head: Normocephalic and atraumatic Mouth: no erythema or exudates, MMM Eyes: PERRL, EOMI, conjunctivae normal, No scleral icterus.  Neck: Supple, Trachea midline normal ROM, No JVD  Cardiovascular: RRR, S1 normal, S2 normal, no MRG, pulses symmetric and intact bilaterally Pulmonary/Chest: CTAB, no wheezes, rales, or rhonchi Abdominal: Soft. Non-tender, non-distended, bowel sounds are normal, no masses, organomegaly, or guarding present.  GU: no CVA tenderness Musculoskeletal: No joint deformities, erythema, or stiffness, ROM full and non-tender Extremities: No leg edema Skin: There is exfoliative skin change over face, neck and back above the shoulder level, with slightly erythematous change in the same area. No tenderness. No blisters.   Hematology: no cervical, inginal, or axillary adenopathy.  Neurological: A&O x3, Strength is normal and symmetric bilaterally, cranial nerve II-XII are grossly intact, no focal  motor deficit, sensory intact to light touch bilaterally.  Psychiatric: Normal mood and affect. No suicidal or homicidal ideation.  Assessment & Plan:

## 2014-01-06 NOTE — Patient Instructions (Signed)
1. Please stop taking all the medications except for Albuterol inhaler. 2. Please start taking Diphenhydramine as prescribed. Please use emollient to moisture your skin well.  3. If you have worsening of your symptoms or new symptoms arise, please call the clinic [phone]), or go to the ER immediately if symptoms are severe. 4. Please do not drive when you take the Diphenhydramine.   Please bring in all your medication bottles with you in next visit.

## 2014-01-06 NOTE — Discharge Summary (Signed)
I assisted Dr. Zada GirtKazibwe in the discharge planning.

## 2014-01-06 NOTE — Telephone Encounter (Signed)
Talked with Dr Dalphine HandingBhardwaj and will see pt today at 1:45. Pt instructed to go to ED if any changes, etc SOB

## 2014-01-09 NOTE — Progress Notes (Signed)
Case discussed with Dr. Niu soon after the resident saw the patient.  We reviewed the resident's history and exam and pertinent patient test results.  I agree with the assessment, diagnosis, and plan of care documented in the resident's note. 

## 2017-04-16 ENCOUNTER — Emergency Department (HOSPITAL_COMMUNITY): Payer: Self-pay

## 2017-04-16 ENCOUNTER — Emergency Department (HOSPITAL_COMMUNITY)
Admission: EM | Admit: 2017-04-16 | Discharge: 2017-04-16 | Disposition: A | Discharge status: Home / Self Care | Payer: Self-pay | Attending: Emergency Medicine | Admitting: Emergency Medicine

## 2017-04-16 ENCOUNTER — Encounter (HOSPITAL_COMMUNITY): Payer: Self-pay | Admitting: *Deleted

## 2017-04-16 DIAGNOSIS — Z79899 Other long term (current) drug therapy: Secondary | ICD-10-CM | POA: Insufficient documentation

## 2017-04-16 DIAGNOSIS — Z87891 Personal history of nicotine dependence: Secondary | ICD-10-CM | POA: Insufficient documentation

## 2017-04-16 DIAGNOSIS — J4521 Mild intermittent asthma with (acute) exacerbation: Secondary | ICD-10-CM | POA: Insufficient documentation

## 2017-04-16 LAB — URINALYSIS, ROUTINE W REFLEX MICROSCOPIC
Bilirubin Urine: NEGATIVE
Glucose, UA: NEGATIVE mg/dL
HGB URINE DIPSTICK: NEGATIVE
KETONES UR: NEGATIVE mg/dL
Nitrite: NEGATIVE
Protein, ur: NEGATIVE mg/dL
Specific Gravity, Urine: 1.019 (ref 1.005–1.030)
pH: 6 (ref 5.0–8.0)

## 2017-04-16 LAB — PREGNANCY, URINE: PREG TEST UR: NEGATIVE

## 2017-04-16 MED ORDER — ALBUTEROL SULFATE HFA 108 (90 BASE) MCG/ACT IN AERS
2.0000 | INHALATION_SPRAY | Freq: Once | RESPIRATORY_TRACT | Status: AC
  Administered 2017-04-16: 2 via RESPIRATORY_TRACT
  Filled 2017-04-16: qty 6.7

## 2017-04-16 MED ORDER — AZITHROMYCIN 250 MG PO TABS
500.0000 mg | ORAL_TABLET | Freq: Once | ORAL | Status: AC
  Administered 2017-04-16: 500 mg via ORAL
  Filled 2017-04-16: qty 2

## 2017-04-16 MED ORDER — DOXYCYCLINE HYCLATE 100 MG PO CAPS: 100.0000 mg | ORAL_CAPSULE | Freq: Two times a day (BID) | ORAL | 0 refills | Status: DC

## 2017-04-16 MED ORDER — IPRATROPIUM-ALBUTEROL 0.5-2.5 (3) MG/3ML IN SOLN
3.0000 mL | RESPIRATORY_TRACT | Status: AC
  Administered 2017-04-16 (×3): 3 mL via RESPIRATORY_TRACT
  Filled 2017-04-16: qty 3
  Filled 2017-04-16: qty 6

## 2017-04-16 MED ORDER — PREDNISONE 10 MG PO TABS: 50.0000 mg | ORAL_TABLET | Freq: Every day | ORAL | 0 refills | Status: DC

## 2017-04-16 MED ORDER — PREDNISONE 20 MG PO TABS
60.0000 mg | ORAL_TABLET | Freq: Once | ORAL | Status: AC
  Administered 2017-04-16: 60 mg via ORAL
  Filled 2017-04-16: qty 3

## 2017-04-16 MED ORDER — ALBUTEROL SULFATE HFA 108 (90 BASE) MCG/ACT IN AERS: 2.0000 | INHALATION_SPRAY | RESPIRATORY_TRACT | 2 refills | Status: DC | PRN

## 2017-04-16 NOTE — ED Provider Notes (Signed)
[facility]-EMERGENCY DEPT Provider Note   CSN: [id number] Arrival date & time: 04/16/17  2029     History   Chief Complaint Chief Complaint  Patient presents with  . Asthma    HPI Natalie Ramsey is a [age] y.o. female.  The history is provided by the patient.  Asthma  This is a recurrent problem. The current episode started more than 1 week ago. The problem occurs daily. The problem has been gradually worsening. Associated symptoms include shortness of breath. Pertinent negatives include no chest pain, no abdominal pain and no headaches. The symptoms are aggravated by walking. The symptoms are relieved by rest. The treatment provided moderate relief.   \26 year old female who presents with shortness of breath. She has a history of asthma. States that for the past month she has had intermittent episodes of difficulty breathing. She ran out of her inhaler and has been using a friend's inhaler, with some good effect. Has also had some mild cough, congestion, subjective fevers and chills. Has had some urinary frequency but no dysuria or hematuria. No abdominal pain, nausea, vomiting, lower extremity edema or calf tenderness, or chest pain. States that this feels similar to prior episodes of asthma flareups.  Past Medical History:  Diagnosis Date  . Asthma     Patient Active Problem List   Diagnosis Date Noted  . 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 Healthcare maintenance 01/02/2014  . Fever, unspecified 12/27/2013  . Hypokalemia 12/25/2013  . 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 Sepsis (HCC) 12/24/2013  . Pyelonephritis 12/24/2013  . Asthma, chronic 12/24/2013    History reviewed. No pertinent surgical history.  OB History    No data available       Home Medications    Prior to Admission medications   Medication Sig Start Date End Date Taking? Authorizing Provider  albuterol (PROVENTIL HFA;VENTOLIN HFA) 108 (90 BASE) MCG/ACT inhaler Inhale 2 puffs into the lungs every  6 (six) hours as needed for wheezing or shortness of breath. 01/06/14   Lorretta HarpNiu, Xilin, MD  albuterol (PROVENTIL HFA;VENTOLIN HFA) 108 (90 Base) MCG/ACT inhaler Inhale 2 puffs into the lungs every 4 (four) hours as needed for wheezing or shortness of breath. 04/16/17   Lavera GuiseLiu, Elsie Sakuma Duo, MD  diphenhydrAMINE (BENADRYL) 25 mg capsule Take 1 capsule (25 mg total) by mouth every 6 (six) hours as needed. 01/06/14 01/06/15  Lorretta HarpNiu, Xilin, MD  docusate sodium 100 MG CAPS Take 100 mg by mouth 2 (two) times daily. 12/29/13   Dow AdolphKazibwe, Richard, MD  doxycycline (VIBRAMYCIN) 100 MG capsule Take 1 capsule (100 mg total) by mouth 2 (two) times daily. 04/16/17   Lavera GuiseLiu, Chez Bulnes Duo, MD  predniSONE (DELTASONE) 10 MG tablet Take 5 tablets (50 mg total) by mouth daily. 04/17/17   Lavera GuiseLiu, Cooper Stamp Duo, MD    Family History No family history on file.  Social History Social History  Substance Use Topics  . Smoking status: Former Games developermoker  . Smokeless tobacco: Never Used  . Alcohol use No     Allergies   Ceftriaxone; Levaquin [levofloxacin in d5w]; and Zosyn [piperacillin sod-tazobactam so]   Review of Systems Review of Systems  HENT: Positive for congestion.   Respiratory: Positive for shortness of breath.   Cardiovascular: Negative for chest pain.  Gastrointestinal: Negative for abdominal pain.  Genitourinary: Positive for frequency.  Allergic/Immunologic: Negative for immunocompromised state.  Neurological: Negative for headaches.  Hematological: Does not bruise/bleed easily.  Psychiatric/Behavioral: Negative for confusion.  All other systems reviewed and are negative.  Physical Exam Updated Vital Signs BP (!) 153/100   Pulse 81   Temp 98.9 F (37.2 C) (Oral)   Resp (!) 24   Ht 5\' 11"  (1.803 m)   Wt (!) 320 lb (145.2 kg)   LMP 03/28/2017   SpO2 100%   BMI 44.63 kg/m   Physical Exam Physical Exam  Nursing note and vitals reviewed. Constitutional: Well developed, well nourished, non-toxic, and in no acute  distress Head: Normocephalic and atraumatic.  Mouth/Throat: Oropharynx is clear and moist.  Neck: Normal range of motion. Neck supple.  Cardiovascular: Normal rate and regular rhythm.   Pulmonary/Chest: Effort normal. No conversational dyspnea. Diffuse expiratory wheezing throughout  Abdominal: Soft. There is no tenderness. There is no rebound and no guarding.  Musculoskeletal: Normal range of motion.  Neurological: Alert, no facial droop, fluent speech, moves all extremities symmetrically Skin: Skin is warm and dry.  Psychiatric: Cooperative   ED Treatments / Results  Labs (all labs ordered are listed, but only abnormal results are displayed) Labs Reviewed  URINALYSIS, ROUTINE W REFLEX MICROSCOPIC - Abnormal; Notable for the following:       Result Value   APPearance HAZY (*)    Leukocytes, UA TRACE (*)    Bacteria, UA RARE (*)    Squamous Epithelial / LPF 6-30 (*)    All other components within normal limits  URINE CULTURE  PREGNANCY, URINE    EKG  EKG Interpretation  Date/Time:  Sunday Apr 16 2017 20:52:00 EDT Ventricular Rate:  84 PR Interval:    QRS Duration: 83 QT Interval:  365 QTC Calculation: 432 R Axis:   79 Text Interpretation:  Sinus rhythm no acute changes  Confirmed by Khrystyna Schwalm MD, Maresa Morash [id number]) on 04/16/2017 9:35:50 PM       Radiology Dg Chest 2 View  Result Date: 04/16/2017 CLINICAL DATA:  Asthma attack. Shortness of breath with hacking cough. EXAM: CHEST  2 VIEW COMPARISON:  03/05/2017 FINDINGS: Lungs are adequately inflated without pneumothorax or effusion. Possible patchy density over the right middle lobe/ right base. Cardiomediastinal silhouette and remainder of the exam is unchanged. IMPRESSION: Possible patchy density over the right middle lobe and lung base which may be due to atelectasis or infection. Electronically Signed   By: Elberta Fortis M.D.   On: 04/16/2017 21:19    Procedures Procedures (including critical care time)  Medications Ordered  in ED Medications  albuterol (PROVENTIL HFA;VENTOLIN HFA) 108 (90 Base) MCG/ACT inhaler 2 puff (not administered)  ipratropium-albuterol (DUONEB) 0.5-2.5 (3) MG/3ML nebulizer solution 3 mL (3 mLs Nebulization Given 04/16/17 2146)  predniSONE (DELTASONE) tablet 60 mg (60 mg Oral Given 04/16/17 2116)  azithromycin (ZITHROMAX) tablet 500 mg (500 mg Oral Given 04/16/17 2146)     Initial Impression / Assessment and Plan / ED Course  I have reviewed the triage vital signs and the nursing notes.  Pertinent labs & imaging results that were available during my care of the patient were reviewed by me and considered in my medical decision making (see chart for details).     Presenting with shortness of breath and chest tightness that is consistent with prior history of asthma exacerbation. She is nontoxic in no acute distress and without conversational dyspnea or hypoxia. Initially with diffuse expiratory wheezing. Chest x-ray visualized and shows questionable right middle lobe infiltrate versus atelectasis. We'll treat for potential community-acquired pneumonia with doxycycline given that she has had allergic reaction with penicillins, cephalosporins, and levofloxacin in the past. She is given 3 breathing treatments,  steroids, and oral antibiotics and feels symptomatically improved. Lungs are clear on my reevaluation and feel that she is appropriate for outpatient management. We'll discharge with steroids, inhaler, and antibiotics. Strict return and follow-up instructions reviewed. She expressed understanding of all discharge instructions and felt comfortable with the plan of care.   Final Clinical Impressions(s) / ED Diagnoses   Final diagnoses:  Mild intermittent asthma with exacerbation    New Prescriptions New Prescriptions   ALBUTEROL (PROVENTIL HFA;VENTOLIN HFA) 108 (90 BASE) MCG/ACT INHALER    Inhale 2 puffs into the lungs every 4 (four) hours as needed for wheezing or shortness of breath.    DOXYCYCLINE (VIBRAMYCIN) 100 MG CAPSULE    Take 1 capsule (100 mg total) by mouth 2 (two) times daily.   PREDNISONE (DELTASONE) 10 MG TABLET    Take 5 tablets (50 mg total) by mouth daily.     Lavera Guise, MD 04/16/17 [phone]

## 2017-04-16 NOTE — Discharge Instructions (Signed)
Please take medications as prescribed. For the next 24 hours give yourself 6-8 puffs of albuterol every 4 hours scheduled. Been using give yourself 2 puffs every 4-6 hours only as needed. Take steroids and antibiotics as prescribed.  Please return without fail for worsening symptoms, including difficulty breathing, passing out, confusion, or any other symptoms concerning to you.

## 2017-04-16 NOTE — ED Notes (Signed)
Bed: WA07 Expected date:  Expected time:  Means of arrival:  Comments: 26yo F asthma

## 2017-04-16 NOTE — ED Notes (Signed)
ED Provider at bedside. 

## 2017-04-16 NOTE — ED Triage Notes (Signed)
Pt has been sick for about a month, worsening over several days.  Hx of asthma. Pt was on her way to the hospital for eval and sob/wheezing became worse.  Pt was given 5 albuterol by Fire, 96% on room air, additional 5 albuterol and .5 atrovent given. Last bp 127/98, NSR.

## 2017-04-16 NOTE — ED Triage Notes (Signed)
Pt reports not feeling well for about a month, has had a cough with productive yellow sputum & wheezing, fevers 2 days ago. Pt says she does not have an inhaler, has been using a friends inhaler.

## 2017-04-18 LAB — URINE CULTURE

## 2017-04-20 ENCOUNTER — Encounter (HOSPITAL_COMMUNITY): Payer: Self-pay | Admitting: Emergency Medicine

## 2017-04-20 ENCOUNTER — Emergency Department (HOSPITAL_COMMUNITY)
Admission: EM | Admit: 2017-04-20 | Discharge: 2017-04-20 | Disposition: A | Discharge status: Home / Self Care | Payer: Self-pay | Attending: Emergency Medicine | Admitting: Emergency Medicine

## 2017-04-20 DIAGNOSIS — J45909 Unspecified asthma, uncomplicated: Secondary | ICD-10-CM | POA: Insufficient documentation

## 2017-04-20 DIAGNOSIS — R21 Rash and other nonspecific skin eruption: Secondary | ICD-10-CM

## 2017-04-20 DIAGNOSIS — F17200 Nicotine dependence, unspecified, uncomplicated: Secondary | ICD-10-CM | POA: Insufficient documentation

## 2017-04-20 DIAGNOSIS — L245 Irritant contact dermatitis due to other chemical products: Secondary | ICD-10-CM | POA: Insufficient documentation

## 2017-04-20 MED ORDER — ALBUTEROL SULFATE HFA 108 (90 BASE) MCG/ACT IN AERS
2.0000 | INHALATION_SPRAY | Freq: Once | RESPIRATORY_TRACT | Status: AC
  Administered 2017-04-20: 2 via RESPIRATORY_TRACT

## 2017-04-20 MED ORDER — ALBUTEROL SULFATE HFA 108 (90 BASE) MCG/ACT IN AERS
2.0000 | INHALATION_SPRAY | Freq: Once | RESPIRATORY_TRACT | Status: DC
  Filled 2017-04-20: qty 6.7

## 2017-04-20 NOTE — ED Triage Notes (Addendum)
Rash under arms x 1 week. Pain x 1 day. Reports drainage from rash. Pt stated tyht he underarms started turning red one week ago. She stopped using the deodorant 2 days ago. C/o burning under arms x 24 hours

## 2017-04-20 NOTE — Discharge Instructions (Signed)
Return if any problems.  Use hydrocortisone ointment
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
## 2018-04-26 ENCOUNTER — Encounter (HOSPITAL_COMMUNITY): Payer: Self-pay

## 2018-04-26 ENCOUNTER — Emergency Department (HOSPITAL_COMMUNITY)
Admission: EM | Admit: 2018-04-26 | Discharge: 2018-04-26 | Disposition: A | Discharge status: Home / Self Care | Payer: Self-pay | Attending: Emergency Medicine | Admitting: Emergency Medicine

## 2018-04-26 ENCOUNTER — Other Ambulatory Visit: Payer: Self-pay

## 2018-04-26 DIAGNOSIS — Y999 Unspecified external cause status: Secondary | ICD-10-CM | POA: Insufficient documentation

## 2018-04-26 DIAGNOSIS — W1831XA Fall on same level due to stepping on an object, initial encounter: Secondary | ICD-10-CM | POA: Insufficient documentation

## 2018-04-26 DIAGNOSIS — S76911A Strain of unspecified muscles, fascia and tendons at thigh level, right thigh, initial encounter: Secondary | ICD-10-CM | POA: Insufficient documentation

## 2018-04-26 DIAGNOSIS — Z79899 Other long term (current) drug therapy: Secondary | ICD-10-CM | POA: Insufficient documentation

## 2018-04-26 DIAGNOSIS — Y9301 Activity, walking, marching and hiking: Secondary | ICD-10-CM | POA: Insufficient documentation

## 2018-04-26 DIAGNOSIS — S86911A Strain of unspecified muscle(s) and tendon(s) at lower leg level, right leg, initial encounter: Secondary | ICD-10-CM | POA: Insufficient documentation

## 2018-04-26 DIAGNOSIS — Y929 Unspecified place or not applicable: Secondary | ICD-10-CM | POA: Insufficient documentation

## 2018-04-26 DIAGNOSIS — F17210 Nicotine dependence, cigarettes, uncomplicated: Secondary | ICD-10-CM | POA: Insufficient documentation

## 2018-04-26 DIAGNOSIS — J45909 Unspecified asthma, uncomplicated: Secondary | ICD-10-CM | POA: Insufficient documentation

## 2018-04-26 MED ORDER — NAPROXEN 500 MG PO TABS: 500.0000 mg | ORAL_TABLET | Freq: Two times a day (BID) | ORAL | 0 refills | Status: DC

## 2018-04-26 MED ORDER — KETOROLAC TROMETHAMINE 60 MG/2ML IM SOLN
60.0000 mg | Freq: Once | INTRAMUSCULAR | Status: AC
  Administered 2018-04-26: 60 mg via INTRAMUSCULAR
  Filled 2018-04-26: qty 2

## 2018-04-26 NOTE — ED Provider Notes (Signed)
[facility], [place] EMERGENCY DEPARTMENT Provider Note   CSN: [id number] Arrival date & time: 04/26/18  1244     History   Chief Complaint Chief Complaint  Patient presents with  . Fall    HPI Natalie Ramsey is a 27 y.o. female.  HPI  27 year old female presents with right posterior leg pain.  She states that this morning around 11 AM she was walking when her left foot stepped on something and slipped behind her.  She then did a split with her right leg going forward and left leg going back.  Her right foot hit against the wall and then she started to develop severe pain in her right calf and posterior thigh.  She states she cannot walk due to the degree of pain.  When she is at rest there is mild to moderate pain but then it becomes severe when she tries to bear weight.  She did not have direct trauma to her lower leg or thigh.  She has not taken anything for the pain.  She states initially there seemed to be a little bit of numbness but no numbness since.  Her leg does not feel weak.  Past Medical History:  Diagnosis Date  . Asthma     Patient Active Problem List   Diagnosis Date Noted  . 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 Healthcare maintenance 01/02/2014  . Fever, unspecified 12/27/2013  . Hypokalemia 12/25/2013  . 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 Sepsis (HCC) 12/24/2013  . Pyelonephritis 12/24/2013  . Asthma, chronic 12/24/2013    History reviewed. No pertinent surgical history.   OB History   None      Home Medications    Prior to Admission medications   Medication Sig Start Date End Date Taking? Authorizing Provider  albuterol (PROVENTIL HFA;VENTOLIN HFA) 108 (90 BASE) MCG/ACT inhaler Inhale 2 puffs into the lungs every 6 (six) hours as needed for wheezing or shortness of breath. 01/06/14   Lorretta Harp, MD  albuterol (PROVENTIL HFA;VENTOLIN HFA) 108 (90 Base) MCG/ACT inhaler Inhale 2 puffs into the lungs every 4 (four) hours as needed  for wheezing or shortness of breath. 04/16/17   Lavera Guise, MD  diphenhydrAMINE (BENADRYL) 25 mg capsule Take 1 capsule (25 mg total) by mouth every 6 (six) hours as needed. 01/06/14 01/06/15  Lorretta Harp, MD  docusate sodium 100 MG CAPS Take 100 mg by mouth 2 (two) times daily. 12/29/13   Dow Adolph, MD  doxycycline (VIBRAMYCIN) 100 MG capsule Take 1 capsule (100 mg total) by mouth 2 (two) times daily. 04/16/17   Lavera Guise, MD  naproxen (NAPROSYN) 500 MG tablet Take 1 tablet (500 mg total) by mouth 2 (two) times daily with a meal. 04/26/18   Pricilla Loveless, MD  predniSONE (DELTASONE) 10 MG tablet Take 5 tablets (50 mg total) by mouth daily. 04/17/17   Lavera Guise, MD    Family History History reviewed. No pertinent family history.  Social History Social History   Tobacco Use  . Smoking status: Current Some Day Smoker  . Smokeless tobacco: Never Used  Substance Use Topics  . Alcohol use: No    Comment: occ  . Drug use: No     Allergies   Ceftriaxone; Levaquin [levofloxacin in d5w]; and Zosyn [piperacillin sod-tazobactam so]   Review of Systems Review of Systems  Musculoskeletal: Positive for myalgias.  Neurological: Negative for weakness and numbness.     Physical Exam Updated Vital Signs BP (!) 132/94 (  BP Location: Left Arm)   Pulse 67   Temp 98.9 F (37.2 C) (Oral)   Resp 18   SpO2 99%   Physical Exam  Constitutional: She appears well-developed and well-nourished.  obese  HENT:  Head: Normocephalic and atraumatic.  Nose: Nose normal.  Cardiovascular: Normal rate and regular rhythm.  Pulses:      Dorsalis pedis pulses are 2+ on the right side.  Pulmonary/Chest: Effort normal.  Musculoskeletal:       Right knee: No tenderness found.       Right ankle: She exhibits normal range of motion and no swelling. No tenderness. Achilles tendon exhibits no pain and no defect.       Right upper leg: She exhibits tenderness (posterior, diffuse). She exhibits no bony  tenderness, no swelling and no deformity.       Right lower leg: She exhibits tenderness (posterior, proximal calf). She exhibits no bony tenderness and no swelling.       Right foot: There is normal range of motion and no tenderness.  Normal sensation in RLE. Painful to move leg but no weakness  Neurological: She is alert.  Skin: Skin is warm and dry. She is not diaphoretic.  Nursing note and vitals reviewed.    ED Treatments / Results  Labs (all labs ordered are listed, but only abnormal results are displayed) Labs Reviewed - No data to display  EKG None  Radiology No results found.  Procedures Procedures (including critical care time)  Medications Ordered in ED Medications  ketorolac (TORADOL) injection 60 mg (60 mg Intramuscular Given 04/26/18 1415)     Initial Impression / Assessment and Plan / ED Course  I have reviewed the triage vital signs and the nursing notes.  Pertinent labs & imaging results that were available during my care of the patient were reviewed by me and considered in my medical decision making (see chart for details).     Patient presents with likely muscle strains from doing an accidental split.  She did not have any direct trauma to her legs and thus I do not think x-rays would be beneficial.  There is no significant swelling although this is somewhat limited based on obesity.  However with this mechanism, my suspicion for a complete tear of her muscles would be unlikely.  I think this is a pain control issue.  She appears to be neurovascularly intact.  Thus I will give her NSAIDs including IM Toradol here as well as NSAIDs to go home with.  She will be given crutches to help, and she was able to ambulate with these in the ED.  Return if symptoms are to worsen or not improve, follow-up with PCP.  Final Clinical Impressions(s) / ED Diagnoses   Final diagnoses:  Muscle strain of right lower leg, initial encounter  Muscle strain of right thigh, initial  encounter    ED Discharge Orders        Ordered    naproxen (NAPROSYN) 500 MG tablet  2 times daily with meals     04/26/18 1418       Pricilla Loveless, MD 04/26/18 1419

## 2018-04-26 NOTE — Discharge Instructions (Signed)
The medicine prescribed to you is a nonsteroidal anti-inflammatory.  Do not take other similar medicines such as ibuprofen, Aleve, Advil, etc.  You may use Tylenol in addition to this medicine.  Place ice or heat on the affected, sore areas.  If your pain worsens or does not improve, you may return here for evaluation.  Follow-up with a primary care physician as well.

## 2018-04-26 NOTE — Progress Notes (Signed)
Orthopedic Tech Progress Note Patient Details:  Natalie Ramsey 12-23-1990 [id number]  Ortho Devices Type of Ortho Device: Crutches Ortho Device/Splint Interventions: Application   Post Interventions Patient Tolerated: Well Instructions Provided: Care of device   Nikki Dom 04/26/2018, 2:08 PM

## 2018-04-26 NOTE — ED Triage Notes (Signed)
EMS- pt slipped in her home and went into a split, she complains of right side ham string pain. BP 110 palpated, HR 80, 97% RA

## 2018-10-22 ENCOUNTER — Encounter (HOSPITAL_COMMUNITY): Payer: Self-pay | Admitting: Emergency Medicine

## 2018-10-22 ENCOUNTER — Emergency Department (HOSPITAL_COMMUNITY)
Admission: EM | Admit: 2018-10-22 | Discharge: 2018-10-22 | Disposition: A | Discharge status: Home / Self Care | Payer: Self-pay | Attending: Emergency Medicine | Admitting: Emergency Medicine

## 2018-10-22 ENCOUNTER — Emergency Department (HOSPITAL_COMMUNITY): Payer: Self-pay

## 2018-10-22 DIAGNOSIS — J111 Influenza due to unidentified influenza virus with other respiratory manifestations: Secondary | ICD-10-CM | POA: Insufficient documentation

## 2018-10-22 DIAGNOSIS — R69 Illness, unspecified: Secondary | ICD-10-CM

## 2018-10-22 DIAGNOSIS — J4521 Mild intermittent asthma with (acute) exacerbation: Secondary | ICD-10-CM | POA: Insufficient documentation

## 2018-10-22 DIAGNOSIS — Z79899 Other long term (current) drug therapy: Secondary | ICD-10-CM | POA: Insufficient documentation

## 2018-10-22 DIAGNOSIS — F17200 Nicotine dependence, unspecified, uncomplicated: Secondary | ICD-10-CM | POA: Insufficient documentation

## 2018-10-22 HISTORY — DX: Tubulo-interstitial nephritis, not specified as acute or chronic: N12

## 2018-10-22 LAB — CBC WITH DIFFERENTIAL/PLATELET
Abs Immature Granulocytes: 0.02 10*3/uL (ref 0.00–0.07)
BASOS ABS: 0 10*3/uL (ref 0.0–0.1)
Basophils Relative: 0 %
EOS ABS: 0.4 10*3/uL (ref 0.0–0.5)
EOS PCT: 6 %
HCT: 39.2 % (ref 36.0–46.0)
Hemoglobin: 11.9 g/dL — ABNORMAL LOW (ref 12.0–15.0)
Immature Granulocytes: 0 %
LYMPHS PCT: 22 %
Lymphs Abs: 1.5 10*3/uL (ref 0.7–4.0)
MCH: 27.9 pg (ref 26.0–34.0)
MCHC: 30.4 g/dL (ref 30.0–36.0)
MCV: 92 fL (ref 80.0–100.0)
MONO ABS: 0.7 10*3/uL (ref 0.1–1.0)
Monocytes Relative: 11 %
NRBC: 0 % (ref 0.0–0.2)
Neutro Abs: 4.2 10*3/uL (ref 1.7–7.7)
Neutrophils Relative %: 61 %
Platelets: 290 10*3/uL (ref 150–400)
RBC: 4.26 MIL/uL (ref 3.87–5.11)
RDW: 13.1 % (ref 11.5–15.5)
WBC: 6.8 10*3/uL (ref 4.0–10.5)

## 2018-10-22 LAB — COMPREHENSIVE METABOLIC PANEL
ALT: 14 U/L (ref 0–44)
ANION GAP: 6 (ref 5–15)
AST: 17 U/L (ref 15–41)
Albumin: 3.1 g/dL — ABNORMAL LOW (ref 3.5–5.0)
Alkaline Phosphatase: 69 U/L (ref 38–126)
BILIRUBIN TOTAL: 0.3 mg/dL (ref 0.3–1.2)
BUN: 7 mg/dL (ref 6–20)
CALCIUM: 8.2 mg/dL — AB (ref 8.9–10.3)
CO2: 25 mmol/L (ref 22–32)
Chloride: 108 mmol/L (ref 98–111)
Creatinine, Ser: 0.77 mg/dL (ref 0.44–1.00)
GFR calc non Af Amer: >60 mL/min (ref >60)
Glucose, Bld: 107 mg/dL — ABNORMAL HIGH (ref 70–99)
POTASSIUM: 3.5 mmol/L (ref 3.5–5.1)
SODIUM: 139 mmol/L (ref 135–145)
TOTAL PROTEIN: 6.2 g/dL — AB (ref 6.5–8.1)

## 2018-10-22 LAB — INFLUENZA PANEL BY PCR (TYPE A & B)
INFLBPCR: NEGATIVE
Influenza A By PCR: NEGATIVE

## 2018-10-22 MED ORDER — IPRATROPIUM BROMIDE 0.02 % IN SOLN
0.5000 mg | Freq: Once | RESPIRATORY_TRACT | Status: AC
  Administered 2018-10-22: 0.5 mg via RESPIRATORY_TRACT
  Filled 2018-10-22: qty 2.5

## 2018-10-22 MED ORDER — IBUPROFEN 800 MG PO TABS
800.0000 mg | ORAL_TABLET | Freq: Once | ORAL | Status: AC
  Administered 2018-10-22: 800 mg via ORAL
  Filled 2018-10-22: qty 1

## 2018-10-22 MED ORDER — PREDNISONE 20 MG PO TABS: 40.0000 mg | ORAL_TABLET | Freq: Every day | ORAL | 0 refills | Status: DC

## 2018-10-22 MED ORDER — ALBUTEROL SULFATE (2.5 MG/3ML) 0.083% IN NEBU
5.0000 mg | INHALATION_SOLUTION | Freq: Once | RESPIRATORY_TRACT | Status: AC
  Administered 2018-10-22: 5 mg via RESPIRATORY_TRACT
  Filled 2018-10-22: qty 6

## 2018-10-22 MED ORDER — PREDNISONE 20 MG PO TABS
60.0000 mg | ORAL_TABLET | Freq: Once | ORAL | Status: AC
  Administered 2018-10-22: 60 mg via ORAL
  Filled 2018-10-22: qty 3

## 2018-10-22 NOTE — ED Provider Notes (Signed)
[facility] EMERGENCY DEPARTMENT Provider Note   CSN: [id number] Arrival date & time: 10/22/18  5956     History   Chief Complaint Chief Complaint  Patient presents with  . Influenza    HPI Natalie Ramsey is a 27 y.o. female with a hx of asthma, pyelo and sepsis (2015),  presents to the Emergency Department complaining of gradual, persistent, progressively worsening URI symptoms onset 12 hours ago. Associated symptoms include chills, body aches, nausea, subjective fevers and sore throat.  Patient also endorses cough with yellow sputum and bilateral rib pain.  Patient reports taking Tylenol x1 around 3 PM yesterday and TheraFlu without relief.  Patient has additionally been using her nebulizer machine at home without relief.  She denies previous hospitalization or intubation for her asthma.  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  Patient denies leg swelling, history of pulmonary embolism, recent travel or immobilization.  No aggravating or alleviating factors.  Patient denies headache, neck pain, neck stiffness, rash, abdominal pain, vomiting, diarrhea, weakness, dizziness, syncope.     The history is provided by the patient and medical records. No language interpreter was used.    Past Medical History:  Diagnosis Date  . Asthma   . Pyelonephritis     Patient Active Problem List   Diagnosis Date Noted  . 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 Healthcare maintenance 01/02/2014  . Fever, unspecified 12/27/2013  . Hypokalemia 12/25/2013  . 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 Sepsis (HCC) 12/24/2013  . Pyelonephritis 12/24/2013  . Asthma, chronic 12/24/2013    History reviewed. No pertinent surgical history.   OB History   None      Home Medications    Prior to Admission medications   Medication Sig Start Date End Date Taking? Authorizing Provider  albuterol (PROVENTIL HFA;VENTOLIN HFA) 108 (90 BASE) MCG/ACT  inhaler Inhale 2 puffs into the lungs every 6 (six) hours as needed for wheezing or shortness of breath. 01/06/14   Lorretta Harp, MD  albuterol (PROVENTIL HFA;VENTOLIN HFA) 108 (90 Base) MCG/ACT inhaler Inhale 2 puffs into the lungs every 4 (four) hours as needed for wheezing or shortness of breath. 04/16/17   Lavera Guise, MD  diphenhydrAMINE (BENADRYL) 25 mg capsule Take 1 capsule (25 mg total) by mouth every 6 (six) hours as needed. 01/06/14 01/06/15  Lorretta Harp, MD  docusate sodium 100 MG CAPS Take 100 mg by mouth 2 (two) times daily. 12/29/13   Dow Adolph, MD  doxycycline (VIBRAMYCIN) 100 MG capsule Take 1 capsule (100 mg total) by mouth 2 (two) times daily. 04/16/17   Lavera Guise, MD  naproxen (NAPROSYN) 500 MG tablet Take 1 tablet (500 mg total) by mouth 2 (two) times daily with a meal. 04/26/18   Pricilla Loveless, MD  predniSONE (DELTASONE) 20 MG tablet Take 2 tablets (40 mg total) by mouth daily. 10/22/18   Xaviera Flaten, Dahlia Client, PA-C    Family History No family history on file.  Social History Social History   Tobacco Use  . Smoking status: Current Some Day Smoker  . Smokeless tobacco: Never Used  Substance Use Topics  . Alcohol use: No    Comment: occ  . Drug use: No     Allergies   Ceftriaxone; Levaquin [levofloxacin in d5w]; and Zosyn [piperacillin sod-tazobactam so]   Review of Systems Review of Systems  Constitutional: Positive for chills, fatigue and fever. Negative for appetite change.  HENT: Positive for congestion, postnasal drip, rhinorrhea, sinus  pressure and sore throat. Negative for ear discharge, ear pain and mouth sores.   Eyes: Negative for visual disturbance.  Respiratory: Positive for cough, chest tightness, shortness of breath and wheezing. Negative for stridor.   Cardiovascular: Negative for chest pain, palpitations and leg swelling.  Gastrointestinal: Positive for nausea. Negative for abdominal pain, diarrhea and vomiting.  Genitourinary: Negative for  dysuria, frequency, hematuria and urgency.  Musculoskeletal: Negative for arthralgias, back pain, myalgias and neck stiffness.  Skin: Negative for rash.  Neurological: Negative for syncope, light-headedness, numbness and headaches.  Hematological: Negative for adenopathy.  Psychiatric/Behavioral: The patient is not nervous/anxious.   All other systems reviewed and are negative.    Physical Exam Updated Vital Signs BP (!) 143/90 (BP Location: Right Arm)   Pulse (!) 101   Temp 99.5 F (37.5 C) (Oral)   Resp 18   Ht 5\' 10"  (1.778 m)   Wt (!) 139.3 kg   LMP 09/01/2018   SpO2 97%   BMI 44.05 kg/m   Physical Exam  Constitutional: She appears well-developed and well-nourished. No distress.  Awake, alert, nontoxic appearance  HENT:  Head: Normocephalic and atraumatic.  Right Ear: Tympanic membrane, external ear and ear canal normal.  Left Ear: Tympanic membrane, external ear and ear canal normal.  Nose: Mucosal edema and rhinorrhea present. No epistaxis. Right sinus exhibits no maxillary sinus tenderness and no frontal sinus tenderness. Left sinus exhibits no maxillary sinus tenderness and no frontal sinus tenderness.  Mouth/Throat: Uvula is midline, oropharynx is clear and moist and mucous membranes are normal. Mucous membranes are not pale and not cyanotic. No oropharyngeal exudate, posterior oropharyngeal edema, posterior oropharyngeal erythema or tonsillar abscesses.  Eyes: Pupils are equal, round, and reactive to light. Conjunctivae are normal. No scleral icterus.  Neck: Normal range of motion and full passive range of motion without pain. Neck supple.  Cardiovascular: Normal rate, regular rhythm and intact distal pulses.  Pulmonary/Chest: Accessory muscle usage present. No stridor. Tachypnea noted. No respiratory distress. She has decreased breath sounds. She has wheezes ( Throughout).  Clear and equal breath sounds without focal wheezes, rhonchi, rales  Abdominal: Soft. Bowel  sounds are normal. She exhibits no mass. There is no tenderness. There is no rebound and no guarding.  Musculoskeletal: Normal range of motion. She exhibits no edema.  Lymphadenopathy:    She has no cervical adenopathy.  Neurological: She is alert.  Speech is clear and goal oriented Moves extremities without ataxia  Skin: Skin is warm and dry. No rash noted. She is not diaphoretic.  Psychiatric: She has a normal mood and affect.  Nursing note and vitals reviewed.    ED Treatments / Results  Labs (all labs ordered are listed, but only abnormal results are displayed) Labs Reviewed  CBC WITH DIFFERENTIAL/PLATELET - Abnormal; Notable for the following components:      Result Value   Hemoglobin 11.9 (*)    All other components within normal limits  COMPREHENSIVE METABOLIC PANEL - Abnormal; Notable for the following components:   Glucose, Bld 107 (*)    Calcium 8.2 (*)    Total Protein 6.2 (*)    Albumin 3.1 (*)    All other components within normal limits  INFLUENZA PANEL BY PCR (TYPE A & B)  URINALYSIS, ROUTINE W REFLEX MICROSCOPIC  POC URINE PREG, ED    Radiology Dg Chest 2 View  Result Date: 10/22/2018 CLINICAL DATA:  Cough and chest pain EXAM: CHEST - 2 VIEW COMPARISON:  04/16/2017 FINDINGS: The heart  size and mediastinal contours are within normal limits. Both lungs are clear. The visualized skeletal structures are unremarkable. IMPRESSION: No active cardiopulmonary disease. Electronically Signed   By: Deatra Robinson M.D.   On: 10/22/2018 04:24    Procedures Procedures (including critical care time)  Medications Ordered in ED Medications  albuterol (PROVENTIL) (2.5 MG/3ML) 0.083% nebulizer solution 5 mg (5 mg Nebulization Given 10/22/18 0425)  ipratropium (ATROVENT) nebulizer solution 0.5 mg (0.5 mg Nebulization Given 10/22/18 0425)  predniSONE (DELTASONE) tablet 60 mg (60 mg Oral Given 10/22/18 0421)  albuterol (PROVENTIL) (2.5 MG/3ML) 0.083% nebulizer solution 5 mg (5  mg Nebulization Given 10/22/18 0527)  ipratropium (ATROVENT) nebulizer solution 0.5 mg (0.5 mg Nebulization Given 10/22/18 0527)  ibuprofen (ADVIL,MOTRIN) tablet 800 mg (800 mg Oral Given 10/22/18 0526)     Initial Impression / Assessment and Plan / ED Course  I have reviewed the triage vital signs and the nursing notes.  Pertinent labs & imaging results that were available during my care of the patient were reviewed by me and considered in my medical decision making (see chart for details).  Clinical Course as of Oct 22 617  [facility] Oct 22, 2018  6962 Normal serum creatinine  Creatinine: 0.77 [HM]  0449 Oral temp.  Patient hot to touch  Temp: 99.5 F (37.5 C) [HM]  0449 Mild tachycardia  Pulse Rate(!): 101 [HM]  0449 No pneumothorax, pulmonary edema or consolidation to suggest pneumonia  DG Chest 2 View [HM]  [phone] Improved work of breathing with faint expiratory wheezing on reassessment.  HR improved to 80bpm. Will give 2nd albuterol treatment.   [HM]  [phone] Patient now with clear and equal breath sounds.  Patient remains without tachycardia.   [HM]    Clinical Course User Index [HM] Evy Lutterman, Dahlia Client, PA-C    Patient presents to the emergency department with influenza-like illness.  Pt CXR negative for acute infiltrate.  Personally evaluated these images.  Patients symptoms are consistent with URI, likely viral etiology.  Patient with wheezing on exam.  Suspect asthma exacerbation secondary to viral illness.  Patient given steroids and nebulizer here in the emergency department with complete resolution of wheezing.  She reports she is breathing much better.  She continues to have bilateral rib soreness worse with palpation.  I suspect this is secondary to coughing.  Patient is low risk for pulmonary embolism and tachycardia has resolved.  Highly doubt acute coronary syndrome.  Discussed that antibiotics are not indicated for viral infections. Pt will be discharged with symptomatic  treatment and prednisone.  Discussed regular usage of albuterol at home for the next several days.  Also discussed reasons to return immediately to the emergency department.  Verbalizes understanding and is agreeable with plan. Pt is hemodynamically stable & in NAD prior to dc.   Final Clinical Impressions(s) / ED Diagnoses   Final diagnoses:  Influenza-like illness  Mild intermittent asthma with exacerbation    ED Discharge Orders         Ordered    predniSONE (DELTASONE) 20 MG tablet  Daily,   Status:  Discontinued     10/22/18 0617    predniSONE (DELTASONE) 20 MG tablet  Daily     10/22/18 0617           Sargon Scouten, Dahlia Client, PA-C 10/22/18 4401    Dione Booze, MD 10/22/18 [phone]

## 2018-10-22 NOTE — ED Notes (Signed)
Pt in radiology at this time. 

## 2018-10-22 NOTE — ED Triage Notes (Signed)
Pt arrived POV with friend at bedside, pt c/o chills, body aches, nausea, cough with yellow sputum, intermittent diaphoresis, feeling feverish and sore throat x 12 hours.  Pt states she took Tylenol x 1 @ 1500 yesterday as well as Theraflu. Pt c/o soreness around ribs.  Pt has been using neb machine at home with no relief.

## 2018-10-22 NOTE — Discharge Instructions (Addendum)
1. Medications: albuterol, prednisone, usual home medications °2. Treatment: rest, drink plenty of fluids, begin OTC antihistamine (Zyrtec or Claritin)  °3. Follow Up: Please followup with your primary doctor in 2-3 days for discussion of your diagnoses and further evaluation after today's visit; if you do not have a primary care doctor use the resource guide provided to find one; Please return to the ER for difficulty breathing, high fevers or worsening symptoms. ° °

## 2019-06-21 ENCOUNTER — Other Ambulatory Visit: Payer: Self-pay

## 2019-06-21 DIAGNOSIS — Z20822 Contact with and (suspected) exposure to covid-19: Secondary | ICD-10-CM

## 2019-06-24 LAB — NOVEL CORONAVIRUS, NAA: SARS-CoV-2, NAA: NOT DETECTED

## 2019-06-28 ENCOUNTER — Telehealth: Payer: Self-pay | Admitting: General Practice

## 2019-06-28 NOTE — Telephone Encounter (Signed)
Patient is calling receive negative COVID test results. Patient expressed understanding. °

## 2019-07-08 ENCOUNTER — Other Ambulatory Visit: Payer: Self-pay

## 2019-07-08 ENCOUNTER — Encounter (HOSPITAL_BASED_OUTPATIENT_CLINIC_OR_DEPARTMENT_OTHER): Payer: Self-pay | Admitting: *Deleted

## 2019-07-08 DIAGNOSIS — F17200 Nicotine dependence, unspecified, uncomplicated: Secondary | ICD-10-CM | POA: Insufficient documentation

## 2019-07-08 DIAGNOSIS — J45909 Unspecified asthma, uncomplicated: Secondary | ICD-10-CM | POA: Insufficient documentation

## 2019-07-08 DIAGNOSIS — A599 Trichomoniasis, unspecified: Secondary | ICD-10-CM | POA: Insufficient documentation

## 2019-07-08 DIAGNOSIS — R252 Cramp and spasm: Secondary | ICD-10-CM | POA: Insufficient documentation

## 2019-07-08 DIAGNOSIS — N938 Other specified abnormal uterine and vaginal bleeding: Secondary | ICD-10-CM | POA: Insufficient documentation

## 2019-07-08 DIAGNOSIS — Z79899 Other long term (current) drug therapy: Secondary | ICD-10-CM | POA: Insufficient documentation

## 2019-07-08 LAB — URINALYSIS, ROUTINE W REFLEX MICROSCOPIC
Bilirubin Urine: NEGATIVE
Glucose, UA: NEGATIVE mg/dL
Ketones, ur: NEGATIVE mg/dL
Leukocytes,Ua: NEGATIVE
Nitrite: NEGATIVE
Protein, ur: NEGATIVE mg/dL
Specific Gravity, Urine: 1.02 (ref 1.005–1.030)
pH: 7.5 (ref 5.0–8.0)

## 2019-07-08 LAB — URINALYSIS, MICROSCOPIC (REFLEX)

## 2019-07-08 LAB — PREGNANCY, URINE: Preg Test, Ur: NEGATIVE

## 2019-07-08 NOTE — ED Triage Notes (Signed)
For the past year she has not had a menses. For 3 days she has had spotting on and off. Lower abdominal pain.

## 2019-07-09 ENCOUNTER — Emergency Department (HOSPITAL_BASED_OUTPATIENT_CLINIC_OR_DEPARTMENT_OTHER)
Admission: EM | Admit: 2019-07-09 | Discharge: 2019-07-09 | Disposition: A | Discharge status: Home / Self Care | Payer: Self-pay | Attending: Emergency Medicine | Admitting: Emergency Medicine

## 2019-07-09 DIAGNOSIS — N938 Other specified abnormal uterine and vaginal bleeding: Secondary | ICD-10-CM

## 2019-07-09 DIAGNOSIS — A599 Trichomoniasis, unspecified: Secondary | ICD-10-CM

## 2019-07-09 LAB — WET PREP, GENITAL
Clue Cells Wet Prep HPF POC: NONE SEEN
Sperm: NONE SEEN
Yeast Wet Prep HPF POC: NONE SEEN

## 2019-07-09 MED ORDER — AZITHROMYCIN 250 MG PO TABS
1000.0000 mg | ORAL_TABLET | Freq: Once | ORAL | Status: AC
  Administered 2019-07-09: 1000 mg via ORAL
  Filled 2019-07-09: qty 4

## 2019-07-09 MED ORDER — METRONIDAZOLE 500 MG PO TABS
2000.0000 mg | ORAL_TABLET | Freq: Once | ORAL | Status: AC
  Administered 2019-07-09: 02:00:00 2000 mg via ORAL
  Filled 2019-07-09: qty 4

## 2019-07-09 NOTE — Discharge Instructions (Addendum)
Your seen today for dysfunctional uterine bleeding.  You need to follow-up with women's clinic for full evaluation.  Your wet prep did show evidence of trichomonas.  You need to inform partners and have them tested and treated as well.  Abstain from sexual activity for the next 10 days.

## 2019-07-09 NOTE — ED Provider Notes (Signed)
[facility] EMERGENCY DEPARTMENT Provider Note   CSN: [id number] Arrival date & time: 07/08/19  2152     History   Chief Complaint Chief Complaint  Patient presents with  . Amenorrhea    HPI Natalie Ramsey is a 28 y.o. female.     HPI  This is a 28 year old female with history of asthma who presents with dysfunctional uterine bleeding.  Patient reports that she is used to having a period that is very regular.  However, over the last year she has had no menstrual cycle at all.  She has intermittently noted some vaginal spotting.  This recurred over the weekend.  She states that she has some crampy abdominal discomfort.  Denies dysuria or hematuria.  She reports one female sexual partner and is not concerned regarding STDs.  She states that she does not have health insurance and wanted to get checked out.  Nothing is significantly new today she is just concerned about the irregularity of her bleeding.  She is not currently on any birth control.  Past Medical History:  Diagnosis Date  . Asthma   . Pyelonephritis     Patient Active Problem List   Diagnosis Date Noted  . 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 Healthcare maintenance 01/02/2014  . Fever, unspecified 12/27/2013  . Hypokalemia 12/25/2013  . 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 Sepsis ([place]) 12/24/2013  . Pyelonephritis 12/24/2013  . Asthma, chronic 12/24/2013    History reviewed. No pertinent surgical history.   OB History   No obstetric history on file.      Home Medications    Prior to Admission medications   Medication Sig Start Date End Date Taking? Authorizing Provider  albuterol (PROVENTIL HFA;VENTOLIN HFA) 108 (90 BASE) MCG/ACT inhaler Inhale 2 puffs into the lungs every 6 (six) hours as needed for wheezing or shortness of breath. 01/06/14  Yes Ivor Costa, MD  albuterol (PROVENTIL HFA;VENTOLIN HFA) 108 (90 Base) MCG/ACT inhaler Inhale 2 puffs into the lungs every 4 (four) hours as  needed for wheezing or shortness of breath. 04/16/17   Forde Dandy, MD  diphenhydrAMINE (BENADRYL) 25 mg capsule Take 1 capsule (25 mg total) by mouth every 6 (six) hours as needed. 01/06/14 01/06/15  Ivor Costa, MD  docusate sodium 100 MG CAPS Take 100 mg by mouth 2 (two) times daily. 12/29/13   Jessee Avers, MD  doxycycline (VIBRAMYCIN) 100 MG capsule Take 1 capsule (100 mg total) by mouth 2 (two) times daily. 04/16/17   Forde Dandy, MD  naproxen (NAPROSYN) 500 MG tablet Take 1 tablet (500 mg total) by mouth 2 (two) times daily with a meal. 04/26/18   Sherwood Gambler, MD  predniSONE (DELTASONE) 20 MG tablet Take 2 tablets (40 mg total) by mouth daily. 10/22/18   Muthersbaugh, Jarrett Soho, PA-C    Family History No family history on file.  Social History Social History   Tobacco Use  . Smoking status: Current Some Day Smoker  . Smokeless tobacco: Never Used  Substance Use Topics  . Alcohol use: No    Comment: occ  . Drug use: No     Allergies   Ceftriaxone, Levaquin [levofloxacin in d5w], and Zosyn [piperacillin sod-tazobactam so]   Review of Systems Review of Systems  Constitutional: Negative for fever.  Respiratory: Negative for shortness of breath.   Cardiovascular: Negative for chest pain.  Gastrointestinal: Negative for abdominal pain, nausea and vomiting.  Genitourinary: Positive for pelvic pain and vaginal bleeding. Negative for dysuria and  vaginal discharge.  Skin: Negative for rash.  All other systems reviewed and are negative.    Physical Exam Updated Vital Signs BP (!) 150/91   Pulse 85   Temp 98.5 F (36.9 C) (Oral)   Resp 14   Ht 1.803 m (5\' 11" )   Wt (!) 176.6 kg   SpO2 100%   BMI 54.30 kg/m   Physical Exam Vitals signs and nursing note reviewed.  Constitutional:      Appearance: She is well-developed.     Comments: Morbidly obese  HENT:     Head: Normocephalic and atraumatic.  Eyes:     Pupils: Pupils are equal, round, and reactive to light.   Cardiovascular:     Rate and Rhythm: Normal rate and regular rhythm.  Pulmonary:     Effort: Pulmonary effort is normal. No respiratory distress.  Abdominal:     General: Bowel sounds are normal.     Palpations: Abdomen is soft.     Tenderness: There is no abdominal tenderness.  Genitourinary:    Comments: External vaginal exam normal, no lesions or rashes, scant vaginal bleeding, no cervical motion tenderness or adnexal tenderness Musculoskeletal:     Right lower leg: No edema.     Left lower leg: No edema.  Skin:    General: Skin is warm and dry.  Neurological:     Mental Status: She is alert and oriented to person, place, and time.  Psychiatric:        Mood and Affect: Mood normal.      ED Treatments / Results  Labs (all labs ordered are listed, but only abnormal results are displayed) Labs Reviewed  WET PREP, GENITAL - Abnormal; Notable for the following components:      Result Value   Trich, Wet Prep PRESENT (*)    WBC, Wet Prep HPF POC MODERATE (*)    All other components within normal limits  URINALYSIS, ROUTINE W REFLEX MICROSCOPIC - Abnormal; Notable for the following components:   Hgb urine dipstick MODERATE (*)    All other components within normal limits  URINALYSIS, MICROSCOPIC (REFLEX) - Abnormal; Notable for the following components:   Bacteria, UA FEW (*)    All other components within normal limits  PREGNANCY, URINE  GC/CHLAM[facility]DIA PROBE AMP () [facility]    EKG None  Radiology No results found.  Procedures Procedures (including critical care time)  Medications Ordered in ED Medications  metroNIDAZOLE (FLAGYL) tablet 2,000 mg (has no administration in time range)  azithromycin (ZITHROMAX) tablet 1,000 mg (has no administration in time range)     Initial Impression / Assessment and Plan / ED Course  I have reviewed the triage vital signs and the nursing notes.  Pertinent labs & imaging results that were available during my care  of the patient were reviewed by me and considered in my medical decision making (see chart for details).        Patient presents with abnormal uterine bleeding.  This is not new for her.  She has some associated crampy pelvic pain.  She is overall nontoxic-appearing and vital signs notable for blood pressure of 153/53.  She is morbidly obese.  Exam is fairly benign.  No reproducible abdominal tenderness on exam.  No mass or significant tenderness on pelvic exam.  She does have scant vaginal bleeding.  On wet prep she had trichomonas present.  She reports that she is monogamous with 1 female sexual partner.  They do see her sex  toys.  She was treated with 2 g of Flagyl.  Gonorrhea and Chlamydia testing pending.  Recommend that she have her partner tested and treated.  I discussed with the patient that she really needs to have a formal evaluation by OB/GYN.  I suspect her weight may be contributing somewhat to her irregular menses; however, it would not be unreasonable for her to have an outpatient ultrasound and further work-up.  Patient stated understanding.  I referred her to women's clinic.  After history, exam, and medical workup I feel the patient has been appropriately medically screened and is safe for discharge home. Pertinent diagnoses were discussed with the patient. Patient was given return precautions.   Final Clinical Impressions(s) / ED Diagnoses   Final diagnoses:  Dysfunctional uterine bleeding  Trichomoniasis    ED Discharge Orders    None       Shon BatonHorton, Saahil Herbster F, MD 07/09/19 [phone]

## 2019-07-10 LAB — GC/CHLAMYDIA PROBE AMP (~~LOC~~) NOT AT ARMC
Chlamydia: NEGATIVE
Neisseria Gonorrhea: NEGATIVE

## 2019-10-21 ENCOUNTER — Emergency Department (HOSPITAL_BASED_OUTPATIENT_CLINIC_OR_DEPARTMENT_OTHER): Payer: Self-pay

## 2019-10-21 ENCOUNTER — Other Ambulatory Visit: Payer: Self-pay

## 2019-10-21 ENCOUNTER — Encounter (HOSPITAL_BASED_OUTPATIENT_CLINIC_OR_DEPARTMENT_OTHER): Payer: Self-pay | Admitting: *Deleted

## 2019-10-21 ENCOUNTER — Emergency Department (HOSPITAL_BASED_OUTPATIENT_CLINIC_OR_DEPARTMENT_OTHER)
Admission: EM | Admit: 2019-10-21 | Discharge: 2019-10-21 | Disposition: A | Discharge status: Home / Self Care | Payer: Self-pay | Attending: Emergency Medicine | Admitting: Emergency Medicine

## 2019-10-21 DIAGNOSIS — Z79899 Other long term (current) drug therapy: Secondary | ICD-10-CM | POA: Insufficient documentation

## 2019-10-21 DIAGNOSIS — J4521 Mild intermittent asthma with (acute) exacerbation: Secondary | ICD-10-CM | POA: Insufficient documentation

## 2019-10-21 DIAGNOSIS — Z20822 Contact with and (suspected) exposure to covid-19: Secondary | ICD-10-CM

## 2019-10-21 DIAGNOSIS — F17210 Nicotine dependence, cigarettes, uncomplicated: Secondary | ICD-10-CM | POA: Insufficient documentation

## 2019-10-21 DIAGNOSIS — Z20828 Contact with and (suspected) exposure to other viral communicable diseases: Secondary | ICD-10-CM | POA: Insufficient documentation

## 2019-10-21 DIAGNOSIS — R600 Localized edema: Secondary | ICD-10-CM | POA: Insufficient documentation

## 2019-10-21 LAB — HCG, QUANTITATIVE, PREGNANCY: hCG, Beta Chain, Quant, S: <1 m[IU]/mL (ref <5)

## 2019-10-21 LAB — SARS CORONAVIRUS 2 AG (30 MIN TAT): SARS Coronavirus 2 Ag: NEGATIVE

## 2019-10-21 MED ORDER — IPRATROPIUM-ALBUTEROL 20-100 MCG/ACT IN AERS
INHALATION_SPRAY | RESPIRATORY_TRACT | Status: AC
  Administered 2019-10-21: 4
  Filled 2019-10-21: qty 4

## 2019-10-21 MED ORDER — ALBUTEROL SULFATE HFA 108 (90 BASE) MCG/ACT IN AERS: 2.0000 | INHALATION_SPRAY | Freq: Four times a day (QID) | RESPIRATORY_TRACT | 0 refills | Status: DC | PRN

## 2019-10-21 MED ORDER — PREDNISONE 20 MG PO TABS: 40.0000 mg | ORAL_TABLET | Freq: Every day | ORAL | 0 refills | Status: AC

## 2019-10-21 MED ORDER — PREDNISONE 50 MG PO TABS
60.0000 mg | ORAL_TABLET | Freq: Once | ORAL | Status: AC
  Administered 2019-10-21: 60 mg via ORAL
  Filled 2019-10-21: qty 1

## 2019-10-21 NOTE — ED Provider Notes (Signed)
[facility] EMERGENCY DEPARTMENT Provider Note   CSN: [id number] Arrival date & time: 10/21/19  1805     History   Chief Complaint Chief Complaint  Patient presents with  . Shortness of Breath    HPI Natalie Ramsey is a 28 y.o. female with a past medical history significant for asthma who presents to the ED due to shortness of breath for the past week.  Patient has a history of asthma but ran out of her albuterol inhaler 4 months ago.  She denies hospitalizations and intubations related to her asthma. Patient also admits to intermittent, central and right-sided chest pain for the past week which occurs both at rest with exertion. Patient notes it feels like "heartburn" but sometimes sharp in nature. Each episode occurs for a few minutes and happens about twice a day. Patient admits to having a cold roughly 2 weeks ago. Denies Covid exposure. Denies sick contacts. Patient denies history of blood clot, recent surgeries, recent immobilizations, hemoptysis. Patient also notes to worsening shortness of breath while lying flat for the past few months. Patient admits to smoking a few cigarettes a day. Patient denies fever, chills, abdominal pain, nausea, and vomiting.  Past Medical History:  Diagnosis Date  . Asthma   . Pyelonephritis     Patient Active Problem List   Diagnosis Date Noted  . 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 Healthcare maintenance 01/02/2014  . Fever, unspecified 12/27/2013  . Hypokalemia 12/25/2013  . 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 Sepsis ([facility]) 12/24/2013  . Pyelonephritis 12/24/2013  . Asthma, chronic 12/24/2013    History reviewed. No pertinent surgical history.   OB History   No obstetric history on file.      Home Medications    Prior to Admission medications   Medication Sig Start Date End Date Taking? Authorizing Provider  albuterol (PROVENTIL HFA;VENTOLIN HFA) 108 (90 BASE) MCG/ACT inhaler Inhale 2 puffs into the lungs every  6 (six) hours as needed for wheezing or shortness of breath. 01/06/14   Lorretta HarpNiu, Xilin, MD  albuterol (PROVENTIL HFA;VENTOLIN HFA) 108 (90 Base) MCG/ACT inhaler Inhale 2 puffs into the lungs every 4 (four) hours as needed for wheezing or shortness of breath. 04/16/17   Lavera GuiseLiu, Dana Duo, MD  albuterol (VENTOLIN HFA) 108 (90 Base) MCG/ACT inhaler Inhale 2 puffs into the lungs every 6 (six) hours as needed for wheezing or shortness of breath. 10/21/19   Cheek, Vesta Mixeraroline B, PA-C  diphenhydrAMINE (BENADRYL) 25 mg capsule Take 1 capsule (25 mg total) by mouth every 6 (six) hours as needed. 01/06/14 01/06/15  Lorretta HarpNiu, Xilin, MD  docusate sodium 100 MG CAPS Take 100 mg by mouth 2 (two) times daily. 12/29/13   Dow AdolphKazibwe, Richard, MD  doxycycline (VIBRAMYCIN) 100 MG capsule Take 1 capsule (100 mg total) by mouth 2 (two) times daily. 04/16/17   Lavera GuiseLiu, Dana Duo, MD  naproxen (NAPROSYN) 500 MG tablet Take 1 tablet (500 mg total) by mouth 2 (two) times daily with a meal. 04/26/18   Pricilla LovelessGoldston, Scott, MD  predniSONE (DELTASONE) 20 MG tablet Take 2 tablets (40 mg total) by mouth daily. 10/22/18   Muthersbaugh, Dahlia ClientHannah, PA-C  predniSONE (DELTASONE) 20 MG tablet Take 2 tablets (40 mg total) by mouth daily for 5 days. 10/21/19 10/26/19  Renee Harderheek, Caroline B, PA-C    Family History No family history on file.  Social History Social History   Tobacco Use  . Smoking status: Current Some Day Smoker  . Smokeless tobacco: Never Used  Substance  Use Topics  . Alcohol use: No    Comment: occ  . Drug use: No     Allergies   Ceftriaxone, Levaquin [levofloxacin in d5w], and Zosyn [piperacillin sod-tazobactam so]   Review of Systems Review of Systems  Constitutional: Negative for chills and fever.  HENT: Negative for congestion, rhinorrhea and sore throat.   Respiratory: Positive for shortness of breath.   Cardiovascular: Positive for chest pain and leg swelling.  Gastrointestinal: Negative for abdominal pain, diarrhea, nausea and vomiting.   Neurological: Negative for dizziness, weakness and headaches.     Physical Exam Updated Vital Signs BP (!) 158/82   Pulse 74   Temp 98.5 F (36.9 C)   Resp 18   Ht 5\' 10"  (1.778 m)   Wt (!) 169.2 kg   SpO2 100%   BMI 53.52 kg/m   Physical Exam Vitals signs and nursing note reviewed.  Constitutional:      General: She is not in acute distress. HENT:     Head: Normocephalic.  Eyes:     General:        Right eye: No discharge.        Left eye: No discharge.     Conjunctiva/sclera: Conjunctivae normal.  Neck:     Musculoskeletal: Neck supple.  Cardiovascular:     Rate and Rhythm: Normal rate and regular rhythm.     Pulses: Normal pulses.     Heart sounds: Normal heart sounds. No murmur. No friction rub. No gallop.   Pulmonary:     Effort: Pulmonary effort is normal.     Breath sounds: Normal breath sounds.     Comments: Respirations equal and unlabored, patient able to speak in full sentences, late expiratory wheeze heard throughout. No accessory muscle usage. Chest:     Comments: Reproducible anterior chest wall tenderness. No deformity. No crepitus. Abdominal:     General: Abdomen is flat. There is no distension.     Palpations: Abdomen is soft.     Tenderness: There is no abdominal tenderness. There is no guarding or rebound.  Musculoskeletal:     Right lower leg: Edema present.     Left lower leg: Edema present.     Comments: Able to move all 4 extremities without difficulty. 1+ pitting edema bilaterally. No calf tenderness bilaterally. Negative Homans sign bilaterally.  Skin:    General: Skin is warm and dry.     Capillary Refill: Capillary refill takes less than 2 seconds.  Neurological:     General: No focal deficit present.      ED Treatments / Results  Labs (all labs ordered are listed, but only abnormal results are displayed) Labs Reviewed  SARS CORONAVIRUS 2 AG (30 MIN TAT)  NOVEL CORONAVIRUS, NAA (HOSP ORDER, SEND-OUT TO REF LAB; TAT 18-24 HRS)   HCG, QUANTITATIVE, PREGNANCY    EKG None  Radiology Dg Chest Portable 1 View  Result Date: 10/21/2019 CLINICAL DATA:  Shortness of breath EXAM: PORTABLE CHEST 1 VIEW COMPARISON:  05/13/2019 FINDINGS: Mild cardiomegaly with slight central congestion. No focal airspace disease or effusion. No pneumothorax. IMPRESSION: Mild cardiomegaly with mild central vascular congestion Electronically Signed   By: Donavan Foil M.D.   On: 10/21/2019 21:54    Procedures Procedures (including critical care time)  Medications Ordered in ED Medications  Ipratropium-Albuterol (COMBIVENT) 20-100 MCG/ACT respimat (4 puffs  Given 10/21/19 1817)  predniSONE (DELTASONE) tablet 60 mg (60 mg Oral Given 10/21/19 2128)     Initial Impression / Assessment and  Plan / ED Course  I have reviewed the triage vital signs and the nursing notes.  Pertinent labs & imaging results that were available during my care of the patient were reviewed by me and considered in my medical decision making (see chart for details).       28 year old female presents to the ED for evaluation of shortness of breath. Patient has a history of asthma. Patient is afebrile, not tachycardic or hypoxic.  Patient in no acute distress and non-ill appearing.  Late expiratory wheeze heard throughout. Patient speaking in full sentences. No accessory muscle usage. 1+ pitting edema bilaterally.  Negative Homans sign bilaterally. Suspect symptoms are related to asthma exacerbation vs. COVID infection. PERC negative. Low risk for DVT/PE. Given chest pain is reproducible on exam, suspect chest wall inflammation causing her chest pain, low suspicion for cardiac etiology. Will treat with albuterol and prednisone. CXR, EKG, and COVID test ordered  Rapid Covid test negative. Will send for outpatient Covid test. CXR personally reviewed which demonstrates mild cardiomegaly with mild central vascular congestion. Compared the images from old chest x-rays which  does not show many differences. EKG reviewed which demonstrates sinus rhythm with no signs of ischemia.  Upon reevaluation, wheeze has improved. Will treat symptomatically for asthma exacerbation with prednisone and albuterol. Patient advised to self quarantine until Covid results are available.  PCP number given to patient.  Patient advised to follow-up with PCP to reevaluate her asthma. Strict ED precautions discussed with patient. Patient states understanding and agrees to plan. Patient discharged home in no acute distress and vitals within normal limits.  Final Clinical Impressions(s) / ED Diagnoses   Final diagnoses:  Mild intermittent asthma with exacerbation  Suspected COVID-19 virus infection    ED Discharge Orders         Ordered    albuterol (VENTOLIN HFA) 108 (90 Base) MCG/ACT inhaler  Every 6 hours PRN     10/21/19 2315    predniSONE (DELTASONE) 20 MG tablet  Daily     10/21/19 2315           Lorelle Formosa 10/21/19 2339    Tegeler, Canary Brim, MD 10/22/19 0045

## 2019-10-21 NOTE — Discharge Instructions (Addendum)
I am sending you home with steroids and albuterol. Take as prescribed. Finish all of your steroids. I have included a number for [employer]. I recommend calling to schedule an appointment to establish care and to further evaluate your asthma. Your COVID results are still pending. Self-quarantine until you get your results back. Return to the ER for new or worsening symptoms.

## 2019-10-21 NOTE — ED Notes (Signed)
Pt c/o dyspnea after walking to treatment room. Oxygen sat 100% RA on arrival to room.

## 2019-10-21 NOTE — ED Triage Notes (Addendum)
Pt c/o SOB , cough , body aches, chills and fatique  x 3 days  HX asthma

## 2019-10-21 NOTE — ED Notes (Signed)
ED Provider at bedside. 

## 2019-10-27 LAB — NOVEL CORONAVIRUS, NAA (HOSP ORDER, SEND-OUT TO REF LAB; TAT 18-24 HRS): SARS-CoV-2, NAA: NOT DETECTED

## 2019-11-23 ENCOUNTER — Other Ambulatory Visit: Payer: Self-pay | Admitting: Obstetrics & Gynecology

## 2019-11-23 ENCOUNTER — Emergency Department (HOSPITAL_BASED_OUTPATIENT_CLINIC_OR_DEPARTMENT_OTHER)
Admission: EM | Admit: 2019-11-23 | Discharge: 2019-11-23 | Disposition: A | Discharge status: Home / Self Care | Payer: 59 | Attending: Emergency Medicine | Admitting: Emergency Medicine

## 2019-11-23 ENCOUNTER — Encounter (HOSPITAL_BASED_OUTPATIENT_CLINIC_OR_DEPARTMENT_OTHER): Payer: Self-pay | Admitting: *Deleted

## 2019-11-23 ENCOUNTER — Other Ambulatory Visit: Payer: Self-pay

## 2019-11-23 DIAGNOSIS — J45909 Unspecified asthma, uncomplicated: Secondary | ICD-10-CM | POA: Insufficient documentation

## 2019-11-23 DIAGNOSIS — N938 Other specified abnormal uterine and vaginal bleeding: Secondary | ICD-10-CM

## 2019-11-23 DIAGNOSIS — N939 Abnormal uterine and vaginal bleeding, unspecified: Secondary | ICD-10-CM | POA: Insufficient documentation

## 2019-11-23 DIAGNOSIS — Z79899 Other long term (current) drug therapy: Secondary | ICD-10-CM | POA: Diagnosis not present

## 2019-11-23 DIAGNOSIS — Z87891 Personal history of nicotine dependence: Secondary | ICD-10-CM | POA: Insufficient documentation

## 2019-11-23 LAB — CBC WITH DIFFERENTIAL/PLATELET
Abs Immature Granulocytes: 0.03 10*3/uL (ref 0.00–0.07)
Basophils Absolute: 0 10*3/uL (ref 0.0–0.1)
Basophils Relative: 0 %
Eosinophils Absolute: 0.2 10*3/uL (ref 0.0–0.5)
Eosinophils Relative: 3 %
HCT: 38.6 % (ref 36.0–46.0)
Hemoglobin: 11.9 g/dL — ABNORMAL LOW (ref 12.0–15.0)
Immature Granulocytes: 0 %
Lymphocytes Relative: 24 %
Lymphs Abs: 1.9 10*3/uL (ref 0.7–4.0)
MCH: 28.1 pg (ref 26.0–34.0)
MCHC: 30.8 g/dL (ref 30.0–36.0)
MCV: 91.3 fL (ref 80.0–100.0)
Monocytes Absolute: 0.6 10*3/uL (ref 0.1–1.0)
Monocytes Relative: 8 %
Neutro Abs: 4.9 10*3/uL (ref 1.7–7.7)
Neutrophils Relative %: 65 %
Platelets: 383 10*3/uL (ref 150–400)
RBC: 4.23 MIL/uL (ref 3.87–5.11)
RDW: 13.5 % (ref 11.5–15.5)
WBC: 7.6 10*3/uL (ref 4.0–10.5)
nRBC: 0 % (ref 0.0–0.2)

## 2019-11-23 LAB — PREGNANCY, URINE: Preg Test, Ur: NEGATIVE

## 2019-11-23 MED ORDER — MEGESTROL ACETATE 40 MG PO TABS: 40.0000 mg | ORAL_TABLET | Freq: Two times a day (BID) | ORAL | 3 refills | Status: DC

## 2019-11-23 MED ORDER — FERROUS SULFATE 325 (65 FE) MG PO TABS: 325.0000 mg | ORAL_TABLET | Freq: Every day | ORAL | 1 refills | Status: DC

## 2019-11-23 NOTE — ED Triage Notes (Signed)
Pt reports irregular periods. Her first period of 2020 was in September. Her period started yesterday and she states she is bleeding through a super tampon every hour and is noticing clots. States she feels "faint"

## 2019-11-23 NOTE — Progress Notes (Signed)
Gyn u/s ordered at [facility] for [facility] Message sent to [facility] to schedule her for a gyn visit.

## 2019-11-24 NOTE — ED Provider Notes (Signed)
[facility] Provider Note   CSN: [id number] Arrival date & time: 11/23/19  1656     History Chief Complaint  Patient presents with  . Vaginal Bleeding    Natalie Ramsey is a 28 y.o. female.  28 year old female with past medical history below who presents with vaginal bleeding.  Patient states that she has had irregular periods and back in September began bleeding again after a long time of not having a menstrual period.  She started menstruating again yesterday and has had heavy bleeding with soaking through a large tampon every hour.  She has felt lightheaded and "faint."  She reports associated cramping that feels like menstrual cramps.  No urinary symptoms, vomiting, diarrhea, fevers, or other complaints.  She is sexually active with her fiance.  The history is provided by the patient.       Past Medical History:  Diagnosis Date  . Asthma   . Pyelonephritis     Patient Active Problem List   Diagnosis Date Noted  . 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 Healthcare maintenance 01/02/2014  . Fever, unspecified 12/27/2013  . Hypokalemia 12/25/2013  . 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 Sepsis ([place]) 12/24/2013  . Pyelonephritis 12/24/2013  . Asthma, chronic 12/24/2013    History reviewed. No pertinent surgical history.   OB History   No obstetric history on file.     No family history on file.  Social History   Tobacco Use  . Smoking status: Former Research scientist (life sciences)  . Smokeless tobacco: Never Used  Substance Use Topics  . Alcohol use: Not Currently    Comment: occ  . Drug use: No    Home Medications Prior to Admission medications   Medication Sig Start Date End Date Taking? Authorizing Provider  albuterol (PROVENTIL HFA;VENTOLIN HFA) 108 (90 BASE) MCG/ACT inhaler Inhale 2 puffs into the lungs every 6 (six) hours as needed for wheezing or shortness of breath. 01/06/14   Ivor Costa, MD  albuterol (PROVENTIL HFA;VENTOLIN HFA) 108  (90 Base) MCG/ACT inhaler Inhale 2 puffs into the lungs every 4 (four) hours as needed for wheezing or shortness of breath. 04/16/17   Forde Dandy, MD  albuterol (VENTOLIN HFA) 108 (90 Base) MCG/ACT inhaler Inhale 2 puffs into the lungs every 6 (six) hours as needed for wheezing or shortness of breath. 10/21/19   Suzy Bouchard, PA-C  diphenhydrAMINE (BENADRYL) 25 mg capsule Take 1 capsule (25 mg total) by mouth every 6 (six) hours as needed. 01/06/14 01/06/15  Ivor Costa, MD  docusate sodium 100 MG CAPS Take 100 mg by mouth 2 (two) times daily. 12/29/13   Jessee Avers, MD  doxycycline (VIBRAMYCIN) 100 MG capsule Take 1 capsule (100 mg total) by mouth 2 (two) times daily. 04/16/17   Forde Dandy, MD  ferrous sulfate 325 (65 FE) MG tablet Take 1 tablet (325 mg total) by mouth daily. 11/23/19   Rubby Barbary, Wenda Overland, MD  megestrol (MEGACE) 40 MG tablet Take 1 tablet (40 mg total) by mouth 2 (two) times daily. 11/23/19   Garren Greenman, Wenda Overland, MD  naproxen (NAPROSYN) 500 MG tablet Take 1 tablet (500 mg total) by mouth 2 (two) times daily with a meal. 04/26/18   Sherwood Gambler, MD  predniSONE (DELTASONE) 20 MG tablet Take 2 tablets (40 mg total) by mouth daily. 10/22/18   Muthersbaugh, Jarrett Soho, PA-C    Allergies    Ceftriaxone, Levaquin [levofloxacin in d5w], and Zosyn [piperacillin sod-tazobactam so]  Review of Systems  Review of Systems All other systems reviewed and are negative except that which was mentioned in HPI  Physical Exam Updated Vital Signs BP 122/88 (BP Location: Right Arm)   Pulse 81   Temp 97.8 F (36.6 C) (Oral)   Resp 20   Ht 5\' 10"  (1.778 m)   Wt (!) 152.9 kg   LMP 11/22/2019   SpO2 100%   BMI 48.35 kg/m   Physical Exam Vitals and nursing note reviewed. Exam conducted with a chaperone present.  Constitutional:      General: She is not in acute distress.    Appearance: She is well-developed. She is obese.  HENT:     Head: Normocephalic and atraumatic.  Eyes:       Conjunctiva/sclera: Conjunctivae normal.  Abdominal:     General: Abdomen is flat. There is no distension.     Tenderness: There is no abdominal tenderness.  Genitourinary:    Comments: Exam limited 2/2 body habitus; large amount of blood in vaginal vault obscuring view of cervix Musculoskeletal:     Cervical back: Neck supple.  Skin:    General: Skin is warm and dry.  Neurological:     Mental Status: She is alert and oriented to person, place, and time.  Psychiatric:        Judgment: Judgment normal.     ED Results / Procedures / Treatments   Labs (all labs ordered are listed, but only abnormal results are displayed) Labs Reviewed  CBC WITH DIFFERENTIAL/PLATELET - Abnormal; Notable for the following components:      Result Value   Hemoglobin 11.9 (*)    All other components within normal limits  PREGNANCY, URINE  GC/CHLAMYDIA PROBE AMP ([place]) NOT AT [facility]    EKG None  Radiology No results found.  Procedures Procedures (including critical care time)  Medications Ordered in ED Medications - No data to display  ED Course  I have reviewed the triage vital signs and the nursing notes.  Pertinent labs that were available during my care of the patient were reviewed by me and considered in my medical decision making (see chart for details).    MDM Rules/Calculators/A&P                     Pt w/ reassuring VS and ambulatory in ED. Large amount of blood on exam. Sent GC/chlamydia. Hgb 11.9 stable from previous. UPT negative.  Recently discussed with OB/GYN on-call Dr. [facility].  I appreciate her assistance with the patient's care.  She has recommended 40 mg twice daily Megace until patient can follow-up in their clinic for evaluation and outpatient ultrasound.  She will place the order for ultrasound.  I have reviewed return precautions and she voiced understanding. Final Clinical Impression(s) / ED Diagnoses Final diagnoses:  Abnormal vaginal bleeding    Rx / DC  Orders ED Discharge Orders         Ordered    ferrous sulfate 325 (65 FE) MG tablet  Daily     11/23/19 2012    megestrol (MEGACE) 40 MG tablet  2 times daily     11/23/19 2012           Joss Friedel, 2013, MD 11/24/19 [phone]

## 2019-11-26 LAB — GC/CHLAMYDIA PROBE AMP (~~LOC~~) NOT AT ARMC
Chlamydia: NEGATIVE
Neisseria Gonorrhea: NEGATIVE

## 2019-12-13 ENCOUNTER — Other Ambulatory Visit: Payer: Self-pay

## 2019-12-13 ENCOUNTER — Ambulatory Visit (INDEPENDENT_AMBULATORY_CARE_PROVIDER_SITE_OTHER): Payer: 59 | Admitting: Obstetrics & Gynecology

## 2019-12-13 ENCOUNTER — Ambulatory Visit: Payer: 59 | Admitting: Advanced Practice Midwife

## 2019-12-13 ENCOUNTER — Encounter: Payer: Self-pay | Admitting: Obstetrics & Gynecology

## 2019-12-13 VITALS — BP 103/69 | HR 87 | Ht 70.0 in | Wt >= 6400 oz

## 2019-12-13 DIAGNOSIS — Z124 Encounter for screening for malignant neoplasm of cervix: Secondary | ICD-10-CM | POA: Diagnosis not present

## 2019-12-13 DIAGNOSIS — B373 Candidiasis of vulva and vagina: Secondary | ICD-10-CM

## 2019-12-13 DIAGNOSIS — Z01419 Encounter for gynecological examination (general) (routine) without abnormal findings: Secondary | ICD-10-CM

## 2019-12-13 DIAGNOSIS — N913 Primary oligomenorrhea: Secondary | ICD-10-CM

## 2019-12-13 DIAGNOSIS — N898 Other specified noninflammatory disorders of vagina: Secondary | ICD-10-CM | POA: Diagnosis not present

## 2019-12-13 MED ORDER — NORETHIN ACE-ETH ESTRAD-FE 1-20 MG-MCG(24) PO TABS: 1.0000 | ORAL_TABLET | Freq: Every day | ORAL | 11 refills | Status: DC

## 2019-12-13 NOTE — Progress Notes (Signed)
Subjective:     Natalie Ramsey is a 29 y.o. female here for a routine exam. G0 LMP 9/20220  Current complaints: Pt reports oligomenorrhea since menarche. She reports that she had a cycle in 10/2018 and her next cycles was 07/2019. She report that at that time her cycles were extremely heavy. She was started on Megace in the ED which she stopped a few days ago. 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     Gynecologic History Patient's last menstrual period was 11/22/2019. Contraception: female partner Last Pap: > 5 years prev.  Last mammogram: n/a  Obstetric History OB History  Gravida Para Term Preterm AB Living  0 0 0 0 0 0  SAB TAB Ectopic Multiple Live Births  0 0 0 0 0   The following portions of the patient's history were reviewed and updated as appropriate: allergies, current medications, past family history, past medical history, past social history, past surgical history and problem list.  Review of Systems Pertinent items are noted in HPI.    Objective:  BP 103/69   Pulse 87   Ht 5\' 10"  (1.778 m)   Wt (!) 409 lb (185.5 kg)   LMP 11/22/2019   BMI 58.69 kg/m  General Appearance:    Alert, cooperative, no distress, appears stated age  Head:    Normocephalic, without obvious abnormality, atraumatic  Eyes:    conjunctiva/corneas clear, EOM's intact, both eyes  Ears:    Normal external ear canals, both ears  Nose:   Nares normal, septum midline, mucosa normal, no drainage    or sinus tenderness  Throat:   Lips, mucosa, and tongue normal; teeth and gums normal  Neck:   Supple, symmetrical, trachea midline, no adenopathy;    thyroid:  no enlargement/tenderness/nodules; Acanthosis nigricans noted  Back:     Symmetric, no curvature, ROM normal, no CVA tenderness  Lungs:     respirations unlabored  Chest Wall:    No tenderness or deformity   Heart:    Regular rate and rhythm  Breast Exam:    No  tenderness, masses, or nipple abnormality  Abdomen:     Soft, non-tender, bowel sounds active all four quadrants,    no masses, no organomegaly  Genitalia:    Normal female without lesion, discharge or tenderness     Extremities:   Extremities normal, atraumatic, no cyanosis or edema  Pulses:   2+ and symmetric all extremities  Skin:   Skin color, texture, turgor normal, no rashes or lesions; multiple tattoos noted. Both arms     Assessment:    Healthy female exam.   Oligomenorrhea- suspect PCOS Obesity Pt does not have primary care provider. Had not had eval for elevated chol or DM     Plan:   Diagnoses and all orders for this visit:  Well female exam with routine gynecological exam -     Cytology - PAP( [place]) -     Cervicovaginal ancillary only( [place]) -     11/24/2019 PELVIS TRANSVAGINAL NON-OB (TV ONLY); Future -     Ambulatory referral to Family Practice -     TSH+Prl+TestT+TestF+17OHP -     Hemoglobin A1c -     Lipid panel -     Norethindrone Acetate-Ethinyl Estrad-FE (LOESTRIN 24 FE) 1-20 MG-MCG(24) tablet; Take 1 tablet by mouth daily.  Primary oligomenorrhea -     Cytology - PAP(  [place]) -     Cervicovaginal ancillary only( [place]) -     US PELVIS TRANSVAGINAL NON-OB (TV ONLY); Future -     Ambulatory referral to Family Practice -     TSH+Prl+TestT+TestF+17OHP -     Hemoglobin A1c -     Lipid panel -     Norethindrone Acetate-Ethinyl Estrad-FE (LOESTRIN 24 FE) 1-20 MG-MCG(24) tablet; Take 1 tablet by mouth daily.  Vaginal odor -     Cervicovaginal ancillary only( [place])  f/u in 6 weeks pr sooner prn  Shandria Clinch L. Harraway-Smith, M.D., Cherlynn June

## 2019-12-13 NOTE — Patient Instructions (Signed)

## 2019-12-17 LAB — CYTOLOGY - PAP: Diagnosis: NEGATIVE

## 2019-12-17 LAB — CERVICOVAGINAL ANCILLARY ONLY
Bacterial Vaginitis (gardnerella): NEGATIVE
Candida Glabrata: NEGATIVE
Candida Vaginitis: POSITIVE — AB
Comment: NEGATIVE
Comment: NEGATIVE
Comment: NEGATIVE

## 2019-12-18 ENCOUNTER — Telehealth: Payer: Self-pay

## 2019-12-18 ENCOUNTER — Other Ambulatory Visit: Payer: Self-pay | Admitting: Obstetrics & Gynecology

## 2019-12-18 DIAGNOSIS — B379 Candidiasis, unspecified: Secondary | ICD-10-CM

## 2019-12-18 MED ORDER — FLUCONAZOLE 150 MG PO TABS: 150.0000 mg | ORAL_TABLET | Freq: Once | ORAL | 0 refills | Status: AC

## 2019-12-18 NOTE — Telephone Encounter (Signed)
Called pt regarding positive yeast infection results. Diflucan was sent to the pharmacy. Understanding was voiced. Endrit Gittins l Margorie Renner, CMA

## 2019-12-22 LAB — LIPID PANEL
Chol/HDL Ratio: 4.3 ratio (ref 0.0–4.4)
Cholesterol, Total: 152 mg/dL (ref 100–199)
HDL: 35 mg/dL — ABNORMAL LOW (ref >39)
LDL Chol Calc (NIH): 106 mg/dL — ABNORMAL HIGH (ref 0–99)
Triglycerides: 52 mg/dL (ref 0–149)
VLDL Cholesterol Cal: 11 mg/dL (ref 5–40)

## 2019-12-22 LAB — TSH+PRL+TESTT+TESTF+17OHP
17-Hydroxyprogesterone: <10 ng/dL
Prolactin: 18.7 ng/mL (ref 4.8–23.3)
TSH: 2.81 u[IU]/mL (ref 0.450–4.500)
Testosterone, Free: 2.7 pg/mL (ref 0.0–4.2)
Testosterone, Total, LC/MS: 13 ng/dL (ref 10.0–55.0)

## 2019-12-22 LAB — HEMOGLOBIN A1C
Est. average glucose Bld gHb Est-mCnc: 114 mg/dL
Hgb A1c MFr Bld: 5.6 % (ref 4.8–5.6)

## 2019-12-23 ENCOUNTER — Ambulatory Visit (HOSPITAL_COMMUNITY): Payer: 59

## 2019-12-30 ENCOUNTER — Ambulatory Visit (HOSPITAL_COMMUNITY): Payer: 59

## 2020-01-01 ENCOUNTER — Ambulatory Visit: Payer: 59 | Admitting: Obstetrics & Gynecology

## 2020-01-10 ENCOUNTER — Encounter (HOSPITAL_BASED_OUTPATIENT_CLINIC_OR_DEPARTMENT_OTHER): Payer: Self-pay | Admitting: Emergency Medicine

## 2020-01-10 ENCOUNTER — Emergency Department (HOSPITAL_BASED_OUTPATIENT_CLINIC_OR_DEPARTMENT_OTHER)
Admission: EM | Admit: 2020-01-10 | Discharge: 2020-01-10 | Disposition: A | Discharge status: Home / Self Care | Payer: 59 | Attending: Emergency Medicine | Admitting: Emergency Medicine

## 2020-01-10 ENCOUNTER — Other Ambulatory Visit: Payer: Self-pay

## 2020-01-10 DIAGNOSIS — Z881 Allergy status to other antibiotic agents status: Secondary | ICD-10-CM | POA: Insufficient documentation

## 2020-01-10 DIAGNOSIS — Z880 Allergy status to penicillin: Secondary | ICD-10-CM | POA: Insufficient documentation

## 2020-01-10 DIAGNOSIS — F17210 Nicotine dependence, cigarettes, uncomplicated: Secondary | ICD-10-CM | POA: Insufficient documentation

## 2020-01-10 DIAGNOSIS — J4521 Mild intermittent asthma with (acute) exacerbation: Secondary | ICD-10-CM

## 2020-01-10 MED ORDER — IPRATROPIUM BROMIDE HFA 17 MCG/ACT IN AERS
INHALATION_SPRAY | RESPIRATORY_TRACT | Status: AC
  Filled 2020-01-10: qty 12.9

## 2020-01-10 MED ORDER — PREDNISONE 50 MG PO TABS: 50.0000 mg | ORAL_TABLET | Freq: Every day | ORAL | 0 refills | Status: DC

## 2020-01-10 MED ORDER — ALBUTEROL SULFATE (2.5 MG/3ML) 0.083% IN NEBU: 2.5000 mg | INHALATION_SOLUTION | Freq: Four times a day (QID) | RESPIRATORY_TRACT | 12 refills | Status: DC | PRN

## 2020-01-10 MED ORDER — ALBUTEROL SULFATE HFA 108 (90 BASE) MCG/ACT IN AERS
INHALATION_SPRAY | RESPIRATORY_TRACT | Status: AC
  Filled 2020-01-10: qty 6.7

## 2020-01-10 MED ORDER — PREDNISONE 50 MG PO TABS
60.0000 mg | ORAL_TABLET | Freq: Once | ORAL | Status: AC
  Administered 2020-01-10: 60 mg via ORAL
  Filled 2020-01-10: qty 1

## 2020-01-10 NOTE — Discharge Instructions (Signed)
Take the medications as prescribed.  Follow-up with a primary care doctor as we discussed.

## 2020-01-10 NOTE — ED Triage Notes (Signed)
Pt c/o SHOB, onset  3wks ago; hx of asthma; out of neb medication

## 2020-01-10 NOTE — ED Provider Notes (Signed)
[facility] EMERGENCY DEPARTMENT Provider Note   CSN: [id number] Arrival date & time: 01/10/20  1101     History Chief Complaint  Patient presents with  . Shortness of Breath    Natalie Ramsey is a 29 y.o. female.  HPI   Patient presents to the ED for evaluation of shortness of breath.  Patient has a history of asthma.  She finds that various things trigger her attacks.  Sometimes it is temperature changes.  Sometimes it is environmental triggers like dust.  Patient's had symptoms for couple weeks.  She ran out of her nebulizer medication.  Today her symptoms were more severe and she was feeling short of breath.  She denies any fevers or chills.  No chest pain.  No vomiting or diarrhea.  Patient was given albuterol and Atrovent inhaler treatments prior to my eval.  She is feeling much better now.  Past Medical History:  Diagnosis Date  . Asthma   . Asthma   . Pyelonephritis     Patient Active Problem List   Diagnosis Date Noted  . 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 Healthcare maintenance 01/02/2014  . Fever, unspecified 12/27/2013  . Hypokalemia 12/25/2013  . 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 Sepsis (HCC) 12/24/2013  . Pyelonephritis 12/24/2013  . Asthma, chronic 12/24/2013    History reviewed. No pertinent surgical history.   OB History    Gravida  0   Para  0   Term  0   Preterm  0   AB  0   Living  0     SAB  0   TAB  0   Ectopic  0   Multiple  0   Live Births  0           No family history on file.  Social History   Tobacco Use  . Smoking status: Current Some Day Smoker  . Smokeless tobacco: Never Used  Substance Use Topics  . Alcohol use: Not Currently    Comment: occ  . Drug use: No    Home Medications Prior to Admission medications   Medication Sig Start Date End Date Taking? Authorizing Provider  albuterol (PROVENTIL HFA;VENTOLIN HFA) 108 (90 Base) MCG/ACT inhaler Inhale 2 puffs into the lungs every 4  (four) hours as needed for wheezing or shortness of breath. 04/16/17   Lavera Guise, MD  albuterol (PROVENTIL) (2.5 MG/3ML) 0.083% nebulizer solution Take 3 mLs (2.5 mg total) by nebulization every 6 (six) hours as needed for wheezing or shortness of breath. 01/10/20   Linwood Dibbles, MD  diphenhydrAMINE (BENADRYL) 25 mg capsule Take 1 capsule (25 mg total) by mouth every 6 (six) hours as needed. 01/06/14 01/06/15  Lorretta Harp, MD  ferrous sulfate 325 (65 FE) MG tablet Take 1 tablet (325 mg total) by mouth daily. 11/23/19   Little, Ambrose Finland, MD  megestrol (MEGACE) 40 MG tablet Take 1 tablet (40 mg total) by mouth 2 (two) times daily. 11/23/19   Little, Ambrose Finland, MD  Norethindrone Acetate-Ethinyl Estrad-FE (LOESTRIN 24 FE) 1-20 MG-MCG(24) tablet Take 1 tablet by mouth daily. 12/13/19   Willodean Rosenthal, MD  predniSONE (DELTASONE) 50 MG tablet Take 1 tablet (50 mg total) by mouth daily. 01/10/20   Linwood Dibbles, MD    Allergies    Ceftriaxone, Levaquin [levofloxacin in d5w], and Zosyn [piperacillin sod-tazobactam so]  Review of Systems   Review of Systems  All other systems reviewed and are negative.   Physical Exam Updated  Vital Signs BP (!) 156/92 (BP Location: Right Arm)   Pulse (!) 101   Resp (!) 26 Comment: RRT Crystal informed  Ht 1.803 m (5\' 11" )   Wt (!) 185.5 kg   LMP 01/06/2020   SpO2 97%   BMI 57.04 kg/m   Physical Exam Vitals and nursing note reviewed.  Constitutional:      General: She is not in acute distress.    Appearance: She is well-developed.     Comments: Overweight  HENT:     Head: Normocephalic and atraumatic.     Right Ear: External ear normal.     Left Ear: External ear normal.  Eyes:     General: No scleral icterus.       Right eye: No discharge.        Left eye: No discharge.     Conjunctiva/sclera: Conjunctivae normal.  Neck:     Trachea: No tracheal deviation.  Cardiovascular:     Rate and Rhythm: Normal rate and regular rhythm.  Pulmonary:       Effort: Pulmonary effort is normal. No respiratory distress.     Breath sounds: Normal breath sounds. No stridor. No wheezing or rales.  Abdominal:     General: Bowel sounds are normal. There is no distension.     Palpations: Abdomen is soft.     Tenderness: There is no abdominal tenderness. There is no guarding or rebound.  Musculoskeletal:        General: No tenderness.     Cervical back: Neck supple.     Right lower leg: No tenderness. No edema.     Left lower leg: No tenderness. No edema.  Skin:    General: Skin is warm and dry.     Findings: No rash.  Neurological:     Mental Status: She is alert.     Cranial Nerves: No cranial nerve deficit (no facial droop, extraocular movements intact, no slurred speech).     Sensory: No sensory deficit.     Motor: No abnormal muscle tone or seizure activity.     Coordination: Coordination normal.     ED Results / Procedures / Treatments   Labs (all labs ordered are listed, but only abnormal results are displayed) Labs Reviewed - No data to display  EKG None  Radiology No results found.  Procedures Procedures (including critical care time)  Medications Ordered in ED Medications  predniSONE (DELTASONE) tablet 60 mg (has no administration in time range)  albuterol (VENTOLIN HFA) 108 (90 Base) MCG/ACT inhaler (  Given by Other 01/10/20 1123)  ipratropium (ATROVENT HFA) 17 MCG/ACT inhaler (  Given by Other 01/10/20 1122)    ED Course  I have reviewed the triage vital signs and the nursing notes.  Pertinent labs & imaging results that were available during my care of the patient were reviewed by me and considered in my medical decision making (see chart for details).    MDM Rules/Calculators/A&P                      No wheezing noted on my exam at this time.  Patient was noted to be wheezing prior to her albuterol and Atrovent treatments.  I will give the patient a prescription for prednisone.  I discussed the use of steroid  inhalers.  Patient states she has insurance with her new job.  Discussed following up with her primary care doctor she may benefit from maintenance inhalers as opposed to just a  rescue inhaler. Final Clinical Impression(s) / ED Diagnoses Final diagnoses:  Mild intermittent asthma with exacerbation    Rx / DC Orders ED Discharge Orders         Ordered    predniSONE (DELTASONE) 50 MG tablet  Daily     01/10/20 1139    albuterol (PROVENTIL) (2.5 MG/3ML) 0.083% nebulizer solution  Every 6 hours PRN     01/10/20 1139           Dorie Rank, MD 01/10/20 1139

## 2020-03-10 ENCOUNTER — Ambulatory Visit (HOSPITAL_COMMUNITY)
Admission: EM | Admit: 2020-03-10 | Discharge: 2020-03-10 | Disposition: A | Discharge status: Home / Self Care | Payer: Self-pay | Attending: Physician Assistant | Admitting: Physician Assistant

## 2020-03-10 ENCOUNTER — Encounter (HOSPITAL_COMMUNITY): Payer: Self-pay

## 2020-03-10 ENCOUNTER — Other Ambulatory Visit: Payer: Self-pay

## 2020-03-10 DIAGNOSIS — Z3202 Encounter for pregnancy test, result negative: Secondary | ICD-10-CM

## 2020-03-10 DIAGNOSIS — M25562 Pain in left knee: Secondary | ICD-10-CM

## 2020-03-10 LAB — POCT PREGNANCY, URINE: Preg Test, Ur: NEGATIVE

## 2020-03-10 LAB — POC URINE PREG, ED
Preg Test, Ur: NEGATIVE
Preg Test, Ur: NEGATIVE

## 2020-03-10 MED ORDER — IBUPROFEN 800 MG PO TABS: 800.0000 mg | ORAL_TABLET | Freq: Three times a day (TID) | ORAL | 0 refills | Status: DC

## 2020-03-10 NOTE — Discharge Instructions (Addendum)
Use the ace wrap and crutches  Follow up with the sports medicine group or report to an orthopedic urgent care if unable to be seen in 1-2 weeks with Kela Millin or [facility] in [place]  If swelling or redness gets worse, pain gets a lot worse or you have fever please go to ortho urgent care or return here

## 2020-03-10 NOTE — ED Triage Notes (Signed)
Pt states she felt a pop in his left knee. This happen at about 3 am this morning .

## 2020-03-10 NOTE — ED Provider Notes (Signed)
[facility]    CSN: [id number] Arrival date & time: 03/10/20  1507      History   Chief Complaint Chief Complaint  Patient presents with  . Knee Pain    HPI Natalie Ramsey is a 29 y.o. female.   Patient presents for evaluation of left knee pain. She reports she felt a pop this morning at 3 AM while at work. She reports she got up from seated position and walked a few steps and felt a pop on the inside part of her left knee. She had pain immediately following this. She reports that she did not trip and fall or twist the knee however did stumble a little bit after feeling a pop. She has had sharp pain on the inside portion of her left knee with walking since then. Reports it is 10/10 when walking. She reports it has felt warm and believes there is been a little bit of swelling. She denies any warmth or swelling prior to this episode this morning. She reports she has a history of knee weakness. No known injuries. Denies numbness or tingling in the left foot or lower extremity. No rashes. No previous infections. No fever or chills.     Past Medical History:  Diagnosis Date  . Asthma   . Asthma   . Pyelonephritis     Patient Active Problem List   Diagnosis Date Noted  . 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 Healthcare maintenance 01/02/2014  . Fever, unspecified 12/27/2013  . Hypokalemia 12/25/2013  . 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 Sepsis (HCC) 12/24/2013  . Pyelonephritis 12/24/2013  . Asthma, chronic 12/24/2013    History reviewed. No pertinent surgical history.  OB History    Gravida  0   Para  0   Term  0   Preterm  0   AB  0   Living  0     SAB  0   TAB  0   Ectopic  0   Multiple  0   Live Births  0            Home Medications    Prior to Admission medications   Medication Sig Start Date End Date Taking? Authorizing Provider  albuterol (PROVENTIL HFA;VENTOLIN HFA) 108 (90 Base) MCG/ACT inhaler Inhale 2 puffs into  the lungs every 4 (four) hours as needed for wheezing or shortness of breath. 04/16/17   Lavera Guise, MD  albuterol (PROVENTIL) (2.5 MG/3ML) 0.083% nebulizer solution Take 3 mLs (2.5 mg total) by nebulization every 6 (six) hours as needed for wheezing or shortness of breath. 01/10/20   Linwood Dibbles, MD  diphenhydrAMINE (BENADRYL) 25 mg capsule Take 1 capsule (25 mg total) by mouth every 6 (six) hours as needed. 01/06/14 01/06/15  Lorretta Harp, MD  ferrous sulfate 325 (65 FE) MG tablet Take 1 tablet (325 mg total) by mouth daily. 11/23/19   Little, Ambrose Finland, MD  ibuprofen (ADVIL) 800 MG tablet Take 1 tablet (800 mg total) by mouth 3 (three) times daily. 03/10/20   Analyah Mcconnon, Veryl Speak, PA-C  megestrol (MEGACE) 40 MG tablet Take 1 tablet (40 mg total) by mouth 2 (two) times daily. 11/23/19   Little, Ambrose Finland, MD  Norethindrone Acetate-Ethinyl Estrad-FE (LOESTRIN 24 FE) 1-20 MG-MCG(24) tablet Take 1 tablet by mouth daily. 12/13/19   Willodean Rosenthal, MD  predniSONE (DELTASONE) 50 MG tablet Take 1 tablet (50 mg total) by mouth daily. 01/10/20   Linwood Dibbles, MD    Family  History History reviewed. No pertinent family history.  Social History Social History   Tobacco Use  . Smoking status: Current Some Day Smoker    Packs/day: 0.50    Types: Cigarettes  . Smokeless tobacco: Never Used  Substance Use Topics  . Alcohol use: Not Currently    Comment: occ  . Drug use: No     Allergies   Ceftriaxone, Levaquin [levofloxacin in d5w], and Zosyn [piperacillin sod-tazobactam so]   Review of Systems Review of Systems  Constitutional: Negative for chills and fever.    Otherwise see HPI Physical Exam Triage Vital Signs ED Triage Vitals  Enc Vitals Group     BP 03/10/20 1545 116/87     Pulse Rate 03/10/20 1545 89     Resp 03/10/20 1545 18     Temp 03/10/20 1545 98.3 F (36.8 C)     Temp Source 03/10/20 1545 Oral     SpO2 03/10/20 1545 100 %     Weight 03/10/20 1543 (!) 370 lb (167.8 kg)      Height --      Head Circumference --      Peak Flow --      Pain Score 03/10/20 1543 8     Pain Loc --      Pain Edu? --      Excl. in GC? --    No data found.  Updated Vital Signs BP 116/87 (BP Location: Right Arm)   Pulse 89   Temp 98.3 F (36.8 C) (Oral)   Resp 18   Wt (!) 370 lb (167.8 kg)   LMP 02/08/2020   SpO2 100%   BMI 51.60 kg/m   Visual Acuity Right Eye Distance:   Left Eye Distance:   Bilateral Distance:    Right Eye Near:   Left Eye Near:    Bilateral Near:     Physical Exam Vitals and nursing note reviewed.  Constitutional:      General: She is not in acute distress.    Appearance: She is well-developed. She is obese.  HENT:     Head: Normocephalic and atraumatic.  Cardiovascular:     Rate and Rhythm: Normal rate.  Pulmonary:     Effort: Pulmonary effort is normal. No respiratory distress.  Musculoskeletal:     Cervical back: Neck supple.     Comments: Unable to evaluate extent of effusion in left knee due to body habitus. There is tenderness to palpation over the medial aspect at the joint line. Pain with terminal flexion of the knee. Anterior drawer feels stable. No crepitus felt. No pain with compression of patella. Patient with limp due to pain with ambulation.  Skin:    General: Skin is warm and dry.  Neurological:     Mental Status: She is alert.      UC Treatments / Results  Labs (all labs ordered are listed, but only abnormal results are displayed) Labs Reviewed  POC URINE PREG, ED  POCT PREGNANCY, URINE  POC URINE PREG, ED    EKG   Radiology No results found.  Procedures Procedures (including critical care time)  Medications Ordered in UC Medications - No data to display  Initial Impression / Assessment and Plan / UC Course  I have reviewed the triage vital signs and the nursing notes.  Pertinent labs & imaging results that were available during my care of the patient were reviewed by me and considered in my medical  decision making (see chart for details).     #  Left knee pain. Patient is 29 year old female presenting with acute left knee pain. Given no significant injury will defer x-ray at this time. Differential would include meniscal injury versus MCL injury. Discussed utilization of crutches and follow-up with sports medicine orthopedics. Discussed orthopedic walk-in options if unable to have near-term follow-up with sports medicine. -Ace wrap applied and crutches given if difficulty ambulating  - patient verbalizes understanding   Final Clinical Impressions(s) / UC Diagnoses   Final diagnoses:  Acute pain of left knee     Discharge Instructions     Use the ace wrap and crutches  Follow up with the sports medicine group or report to an orthopedic urgent care if unable to be seen in 1-2 weeks with Lynnae Sandhoff or [facility] in [place]  If swelling or redness gets worse, pain gets a lot worse or you have fever please go to ortho urgent care or return here      ED Prescriptions    Medication Sig Dispense Auth. Provider   ibuprofen (ADVIL) 800 MG tablet Take 1 tablet (800 mg total) by mouth 3 (three) times daily. 21 tablet Calogero Geisen, Marguerita Beards, PA-C     PDMP not reviewed this encounter.   Purnell Shoemaker, PA-C 03/10/20 2332

## 2020-03-17 ENCOUNTER — Encounter (HOSPITAL_COMMUNITY): Payer: Self-pay | Admitting: Emergency Medicine

## 2020-03-17 ENCOUNTER — Other Ambulatory Visit: Payer: Self-pay

## 2020-03-17 ENCOUNTER — Emergency Department (HOSPITAL_COMMUNITY)
Admission: EM | Admit: 2020-03-17 | Discharge: 2020-03-17 | Disposition: A | Discharge status: Home / Self Care | Payer: Self-pay | Attending: Emergency Medicine | Admitting: Emergency Medicine

## 2020-03-17 ENCOUNTER — Emergency Department (HOSPITAL_COMMUNITY): Payer: Self-pay

## 2020-03-17 DIAGNOSIS — R05 Cough: Secondary | ICD-10-CM | POA: Insufficient documentation

## 2020-03-17 DIAGNOSIS — J4531 Mild persistent asthma with (acute) exacerbation: Secondary | ICD-10-CM | POA: Insufficient documentation

## 2020-03-17 DIAGNOSIS — Z20822 Contact with and (suspected) exposure to covid-19: Secondary | ICD-10-CM | POA: Insufficient documentation

## 2020-03-17 DIAGNOSIS — J069 Acute upper respiratory infection, unspecified: Secondary | ICD-10-CM | POA: Insufficient documentation

## 2020-03-17 DIAGNOSIS — Z720 Tobacco use: Secondary | ICD-10-CM | POA: Insufficient documentation

## 2020-03-17 LAB — I-STAT BETA HCG BLOOD, ED (MC, WL, AP ONLY): I-stat hCG, quantitative: <5 m[IU]/mL (ref <5)

## 2020-03-17 LAB — BASIC METABOLIC PANEL
Anion gap: 12 (ref 5–15)
BUN: 9 mg/dL (ref 6–20)
CO2: 23 mmol/L (ref 22–32)
Calcium: 8.6 mg/dL — ABNORMAL LOW (ref 8.9–10.3)
Chloride: 102 mmol/L (ref 98–111)
Creatinine, Ser: 0.86 mg/dL (ref 0.44–1.00)
GFR calc Af Amer: >60 mL/min (ref >60)
GFR calc non Af Amer: >60 mL/min (ref >60)
Glucose, Bld: 99 mg/dL (ref 70–99)
Potassium: 3.8 mmol/L (ref 3.5–5.1)
Sodium: 137 mmol/L (ref 135–145)

## 2020-03-17 LAB — RESPIRATORY PANEL BY RT PCR (FLU A&B, COVID)
Influenza A by PCR: NEGATIVE
Influenza B by PCR: NEGATIVE
SARS Coronavirus 2 by RT PCR: NEGATIVE

## 2020-03-17 LAB — CBC
HCT: 39.2 % (ref 36.0–46.0)
Hemoglobin: 12.1 g/dL (ref 12.0–15.0)
MCH: 27.8 pg (ref 26.0–34.0)
MCHC: 30.9 g/dL (ref 30.0–36.0)
MCV: 89.9 fL (ref 80.0–100.0)
Platelets: 347 10*3/uL (ref 150–400)
RBC: 4.36 MIL/uL (ref 3.87–5.11)
RDW: 14.6 % (ref 11.5–15.5)
WBC: 7.1 10*3/uL (ref 4.0–10.5)
nRBC: 0 % (ref 0.0–0.2)

## 2020-03-17 MED ORDER — ACETAMINOPHEN 500 MG PO TABS
1000.0000 mg | ORAL_TABLET | Freq: Once | ORAL | Status: AC
  Administered 2020-03-17: 1000 mg via ORAL
  Filled 2020-03-17: qty 2

## 2020-03-17 MED ORDER — ALBUTEROL SULFATE HFA 108 (90 BASE) MCG/ACT IN AERS
4.0000 | INHALATION_SPRAY | Freq: Once | RESPIRATORY_TRACT | Status: AC
  Administered 2020-03-17: 4 via RESPIRATORY_TRACT
  Filled 2020-03-17: qty 6.7

## 2020-03-17 MED ORDER — ALBUTEROL SULFATE HFA 108 (90 BASE) MCG/ACT IN AERS: 2.0000 | INHALATION_SPRAY | RESPIRATORY_TRACT | 1 refills | Status: DC | PRN

## 2020-03-17 MED ORDER — IPRATROPIUM BROMIDE HFA 17 MCG/ACT IN AERS
2.0000 | INHALATION_SPRAY | Freq: Once | RESPIRATORY_TRACT | Status: AC
  Administered 2020-03-17: 13:00:00 2 via RESPIRATORY_TRACT
  Filled 2020-03-17: qty 12.9

## 2020-03-17 MED ORDER — PREDNISONE 20 MG PO TABS: 60.0000 mg | ORAL_TABLET | Freq: Every day | ORAL | 0 refills | Status: DC

## 2020-03-17 MED ORDER — ALBUTEROL SULFATE HFA 108 (90 BASE) MCG/ACT IN AERS: 4.0000 | INHALATION_SPRAY | Freq: Once | RESPIRATORY_TRACT | Status: DC

## 2020-03-17 MED ORDER — PREDNISONE 20 MG PO TABS: 60.0000 mg | ORAL_TABLET | Freq: Once | ORAL | Status: DC

## 2020-03-17 MED ORDER — IPRATROPIUM BROMIDE HFA 17 MCG/ACT IN AERS: 2.0000 | INHALATION_SPRAY | Freq: Once | RESPIRATORY_TRACT | Status: DC

## 2020-03-17 NOTE — Discharge Instructions (Addendum)
It was our pleasure to provide your ER care today - we hope that you feel better.  Rest. Drink plenty of fluids. Avoid any smoking or smoky areas. Take prednisone as prescribed. Use albuterol inhaler as need.   Follow up with primary care doctor in the coming week.   Return to ER if worse, new symptoms, new or severe pain, increased trouble breathing, or other concern.

## 2020-03-17 NOTE — ED Provider Notes (Signed)
[facility] EMERGENCY DEPARTMENT Provider Note   CSN: [id number] Arrival date & time: 03/17/20  2831     History Chief Complaint  Patient presents with  . Fever    Natalie Ramsey is a 29 y.o. female.  Patient w hx asthma c/o 2 day history of fever. Symptoms acute onset yesterday, moderate, persistent. +associated non prod cough, runny nose, scratchy throat. +wheezing. Denies known, specific ill contacts. No known covid expsoure. No hx covid. w asthma, uses mdi prn. Denies recent admission for asthma or recent oral steroid use. No chest pain or discomfort. No leg pain or swelling. No rash.    Fever Associated symptoms: congestion, cough and rhinorrhea   Associated symptoms: no chest pain, no chills, no confusion, no diarrhea, no dysuria, no headaches, no rash and no vomiting        Past Medical History:  Diagnosis Date  . Asthma   . Asthma   . Pyelonephritis     Patient Active Problem List   Diagnosis Date Noted  . 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 Healthcare maintenance 01/02/2014  . Fever, unspecified 12/27/2013  . Hypokalemia 12/25/2013  . 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 Sepsis (HCC) 12/24/2013  . Pyelonephritis 12/24/2013  . Asthma, chronic 12/24/2013    History reviewed. No pertinent surgical history.   OB History    Gravida  0   Para  0   Term  0   Preterm  0   AB  0   Living  0     SAB  0   TAB  0   Ectopic  0   Multiple  0   Live Births  0           No family history on file.  Social History   Tobacco Use  . Smoking status: Current Some Day Smoker    Packs/day: 0.50    Types: Cigarettes  . Smokeless tobacco: Never Used  Substance Use Topics  . Alcohol use: Not Currently    Comment: occ  . Drug use: No    Home Medications Prior to Admission medications   Medication Sig Start Date End Date Taking? Authorizing Provider  albuterol (PROVENTIL) (2.5 MG/3ML) 0.083% nebulizer solution Take 3  mLs (2.5 mg total) by nebulization every 6 (six) hours as needed for wheezing or shortness of breath. 01/10/20  Yes Linwood Dibbles, MD  ibuprofen (ADVIL) 800 MG tablet Take 1 tablet (800 mg total) by mouth 3 (three) times daily. Patient not taking: Reported on 03/17/2020 03/10/20   Darr, Veryl Speak, PA-C  megestrol (MEGACE) 40 MG tablet Take 1 tablet (40 mg total) by mouth 2 (two) times daily. Patient not taking: Reported on 03/17/2020 11/23/19   Little, Ambrose Finland, MD  Norethindrone Acetate-Ethinyl Estrad-FE (LOESTRIN 24 FE) 1-20 MG-MCG(24) tablet Take 1 tablet by mouth daily. Patient not taking: Reported on 03/17/2020 12/13/19   Willodean Rosenthal, MD  predniSONE (DELTASONE) 50 MG tablet Take 1 tablet (50 mg total) by mouth daily. Patient not taking: Reported on 03/17/2020 01/10/20   Linwood Dibbles, MD    Allergies    Ceftriaxone, Levaquin [levofloxacin in d5w], and Zosyn [piperacillin sod-tazobactam so]  Review of Systems   Review of Systems  Constitutional: Positive for fever. Negative for chills.  HENT: Positive for congestion and rhinorrhea.   Eyes: Negative for redness.  Respiratory: Positive for cough, shortness of breath and wheezing.   Cardiovascular: Negative for chest pain.  Gastrointestinal: Negative for abdominal pain, diarrhea and vomiting.  Endocrine: Negative for polyuria.  Genitourinary: Negative for dysuria and flank pain.  Musculoskeletal: Negative for back pain, neck pain and neck stiffness.  Skin: Negative for rash.  Neurological: Negative for headaches.  Hematological: Does not bruise/bleed easily.  Psychiatric/Behavioral: Negative for confusion.    Physical Exam Updated Vital Signs BP 115/61 (BP Location: Right Arm)   Pulse 97   Temp 99.5 F (37.5 C) (Oral)   Resp 18   SpO2 96%   Physical Exam Vitals and nursing note reviewed.  Constitutional:      Appearance: Normal appearance. She is well-developed.  HENT:     Head: Atraumatic.     Nose: Nose normal.      Mouth/Throat:     Mouth: Mucous membranes are moist.  Eyes:     General: No scleral icterus.    Conjunctiva/sclera: Conjunctivae normal.  Neck:     Trachea: No tracheal deviation.  Cardiovascular:     Rate and Rhythm: Normal rate and regular rhythm.     Pulses: Normal pulses.     Heart sounds: Normal heart sounds. No murmur. No friction rub. No gallop.   Pulmonary:     Effort: Pulmonary effort is normal. No respiratory distress.     Breath sounds: Wheezing present.  Abdominal:     General: Bowel sounds are normal. There is no distension.     Palpations: Abdomen is soft. There is no mass.     Tenderness: There is no abdominal tenderness. There is no guarding or rebound.     Hernia: No hernia is present.     Comments: Obese.   Genitourinary:    Comments: No cva tenderness.  Musculoskeletal:        General: No swelling or tenderness.     Cervical back: Normal range of motion and neck supple. No rigidity. No muscular tenderness.     Right lower leg: No edema.     Left lower leg: No edema.  Skin:    General: Skin is warm and dry.     Findings: No rash.  Neurological:     Mental Status: She is alert.     Comments: Alert, speech normal.   Psychiatric:        Mood and Affect: Mood normal.     ED Results / Procedures / Treatments   Labs (all labs ordered are listed, but only abnormal results are displayed) Results for orders placed or performed during the hospital encounter of 03/17/20  Respiratory Panel by RT PCR (Flu A&B, Covid) - Nasopharyngeal Swab   Specimen: Nasopharyngeal Swab  Result Value Ref Range   SARS Coronavirus 2 by RT PCR NEGATIVE NEGATIVE   Influenza A by PCR NEGATIVE NEGATIVE   Influenza B by PCR NEGATIVE NEGATIVE  CBC  Result Value Ref Range   WBC 7.1 4.0 - 10.5 K/uL   RBC 4.36 3.87 - 5.11 MIL/uL   Hemoglobin 12.1 12.0 - 15.0 g/dL   HCT 39.2 36.0 - 46.0 %   MCV 89.9 80.0 - 100.0 fL   MCH 27.8 26.0 - 34.0 pg   MCHC 30.9 30.0 - 36.0 g/dL   RDW 14.6  11.5 - 15.5 %   Platelets 347 150 - 400 K/uL   nRBC 0.0 0.0 - 0.2 %  Basic metabolic panel  Result Value Ref Range   Sodium 137 135 - 145 mmol/L   Potassium 3.8 3.5 - 5.1 mmol/L   Chloride 102 98 - 111 mmol/L   CO2 23 22 - 32 mmol/L  Glucose, Bld 99 70 - 99 mg/dL   BUN 9 6 - 20 mg/dL   Creatinine, Ser 8.11 0.44 - 1.00 mg/dL   Calcium 8.6 (L) 8.9 - 10.3 mg/dL   GFR calc non Af Amer >60 >60 mL/min   GFR calc Af Amer >60 >60 mL/min   Anion gap 12 5 - 15  I-Stat beta hCG blood, ED  Result Value Ref Range   I-stat hCG, quantitative <5.0 <5 mIU/mL   Comment 3           EKG None  Radiology DG Chest Port 1 View  Result Date: 03/17/2020 CLINICAL DATA:  Onset cough and fever yesterday. EXAM: PORTABLE CHEST 1 VIEW COMPARISON:  Single-view of the chest 10/21/2019. PA and lateral chest 05/13/2019. FINDINGS: Lungs clear. Heart size normal. No pneumothorax or pleural fluid. No acute or focal bony abnormality. IMPRESSION: Negative chest. Electronically Signed   By: Drusilla Kanner M.D.   On: 03/17/2020 13:35    Procedures Procedures (including critical care time)  Medications Ordered in ED Medications  acetaminophen (TYLENOL) tablet 1,000 mg (has no administration in time range)  albuterol (VENTOLIN HFA) 108 (90 Base) MCG/ACT inhaler 4 puff (has no administration in time range)  ipratropium (ATROVENT HFA) inhaler 2 puff (has no administration in time range)    ED Course  I have reviewed the triage vital signs and the nursing notes.  Pertinent labs & imaging results that were available during my care of the patient were reviewed by me and considered in my medical decision making (see chart for details).    MDM Rules/Calculators/A&P                     Lab sent. Cxr.   Reviewed nursing notes and prior charts for additional history. Prior covid testing negative. No recent admissions.   Albuterol and atrovent breathing treatment.   Initial labs reviewed/interpreted by me - chem  normal. Wbc normal.  CXR reviewed/interpreted by me - no pna.   Additional labs reviewed/interpreted by me - covid neg.   Natalie Ramsey was evaluated in Emergency Department on 03/17/2020 for the symptoms described in the history of present illness. She was evaluated in the context of the global COVID-19 pandemic, which necessitated consideration that the patient might be at risk for infection with the SARS-CoV-2 virus that causes COVID-19. Institutional protocols and algorithms that pertain to the evaluation of patients at risk for COVID-19 are in a state of rapid change based on information released by regulatory bodies including the CDC and federal and state organizations. These policies and algorithms were followed during the patient's care in the ED.  Improved air exchange on recheck, persistent mild wheezing.  Prednisone po. Additional alb/atrovent treatment.  Pt is breathing comfortably, no distress. No pain or discomfort.   Po fluids. Ambulate in hall.   Patient currently appears stable for d/c.   Return precautions provided.     Final Clinical Impression(s) / ED Diagnoses Final diagnoses:  None    Rx / DC Orders ED Discharge Orders    None       Cathren Laine, MD 03/17/20 1442

## 2020-03-17 NOTE — ED Triage Notes (Signed)
Pt reports working last night and having cold symptoms. Fever of 103 for EMS. Endorses bilateral rib pain. Hx of asthma and UTI. 1 g of tylenol given by EMS.

## 2020-03-17 NOTE — ED Notes (Signed)
Got patient into a gown on the monitor patient is resting with call bell in reach 

## 2020-05-18 ENCOUNTER — Encounter (HOSPITAL_BASED_OUTPATIENT_CLINIC_OR_DEPARTMENT_OTHER): Payer: Self-pay | Admitting: *Deleted

## 2020-05-18 ENCOUNTER — Ambulatory Visit (HOSPITAL_BASED_OUTPATIENT_CLINIC_OR_DEPARTMENT_OTHER)
Admission: RE | Admit: 2020-05-18 | Discharge: 2020-05-18 | Disposition: A | Discharge status: Home / Self Care | Payer: Self-pay | Source: Ambulatory Visit | Attending: Emergency Medicine | Admitting: Emergency Medicine

## 2020-05-18 ENCOUNTER — Other Ambulatory Visit: Payer: Self-pay

## 2020-05-18 ENCOUNTER — Emergency Department (HOSPITAL_BASED_OUTPATIENT_CLINIC_OR_DEPARTMENT_OTHER)
Admission: EM | Admit: 2020-05-18 | Discharge: 2020-05-18 | Disposition: A | Discharge status: Home / Self Care | Payer: Self-pay | Attending: Emergency Medicine | Admitting: Emergency Medicine

## 2020-05-18 DIAGNOSIS — R102 Pelvic and perineal pain: Secondary | ICD-10-CM

## 2020-05-18 DIAGNOSIS — R1013 Epigastric pain: Secondary | ICD-10-CM | POA: Insufficient documentation

## 2020-05-18 DIAGNOSIS — Z3202 Encounter for pregnancy test, result negative: Secondary | ICD-10-CM | POA: Insufficient documentation

## 2020-05-18 DIAGNOSIS — A5909 Other urogenital trichomoniasis: Secondary | ICD-10-CM | POA: Insufficient documentation

## 2020-05-18 DIAGNOSIS — J45909 Unspecified asthma, uncomplicated: Secondary | ICD-10-CM | POA: Insufficient documentation

## 2020-05-18 DIAGNOSIS — A599 Trichomoniasis, unspecified: Secondary | ICD-10-CM

## 2020-05-18 DIAGNOSIS — Z7951 Long term (current) use of inhaled steroids: Secondary | ICD-10-CM | POA: Insufficient documentation

## 2020-05-18 DIAGNOSIS — F17210 Nicotine dependence, cigarettes, uncomplicated: Secondary | ICD-10-CM | POA: Insufficient documentation

## 2020-05-18 LAB — URINALYSIS, ROUTINE W REFLEX MICROSCOPIC
Bilirubin Urine: NEGATIVE
Glucose, UA: NEGATIVE mg/dL
Ketones, ur: NEGATIVE mg/dL
Leukocytes,Ua: NEGATIVE
Nitrite: NEGATIVE
Protein, ur: NEGATIVE mg/dL
Specific Gravity, Urine: 1.025 (ref 1.005–1.030)
pH: 6 (ref 5.0–8.0)

## 2020-05-18 LAB — WET PREP, GENITAL
Clue Cells Wet Prep HPF POC: NONE SEEN
Sperm: NONE SEEN
Yeast Wet Prep HPF POC: NONE SEEN

## 2020-05-18 LAB — PREGNANCY, URINE: Preg Test, Ur: NEGATIVE

## 2020-05-18 LAB — URINALYSIS, MICROSCOPIC (REFLEX)

## 2020-05-18 MED ORDER — KETOROLAC TROMETHAMINE 30 MG/ML IJ SOLN
30.0000 mg | Freq: Once | INTRAMUSCULAR | Status: AC
  Administered 2020-05-18: 30 mg via INTRAMUSCULAR
  Filled 2020-05-18: qty 1

## 2020-05-18 MED ORDER — AZITHROMYCIN 250 MG PO TABS
2000.0000 mg | ORAL_TABLET | Freq: Once | ORAL | Status: AC
  Administered 2020-05-18: 2000 mg via ORAL
  Filled 2020-05-18: qty 8

## 2020-05-18 MED ORDER — DOXYCYCLINE HYCLATE 100 MG PO CAPS: 100.0000 mg | ORAL_CAPSULE | Freq: Two times a day (BID) | ORAL | 0 refills | Status: DC

## 2020-05-18 MED ORDER — NAPROXEN 500 MG PO TABS: 500.0000 mg | ORAL_TABLET | Freq: Two times a day (BID) | ORAL | 0 refills | Status: DC

## 2020-05-18 MED ORDER — METRONIDAZOLE 500 MG PO TABS
2000.0000 mg | ORAL_TABLET | Freq: Once | ORAL | Status: AC
  Administered 2020-05-18: 2000 mg via ORAL
  Filled 2020-05-18: qty 4

## 2020-05-18 NOTE — ED Triage Notes (Signed)
C/o right lower quad pain that started "weeks ago" has not taken anything for pain. Describes as "tight" sitting in a chair will ease the pain. Pt concerned that she may be pregnant. LMP was in March 2021. C/o n/v. C/o vaginal d/c

## 2020-05-18 NOTE — ED Provider Notes (Signed)
US PELVIC COMPLETE WITH TRANSVAGINAL  Result Date: 05/18/2020 CLINICAL DATA:  RIGHT lower quadrant and pelvic pain; LMP 02/08/2020 EXAM: TRANSABDOMINAL AND TRANSVAGINAL ULTRASOUND OF PELVIS TECHNIQUE: Both transabdominal and transvaginal ultrasound examinations of the pelvis were performed. Transabdominal technique was performed for global imaging of the pelvis including uterus, ovaries, adnexal regions, and pelvic cul-de-sac. It was necessary to proceed with endovaginal exam following the transabdominal exam to visualize the uterus and ovaries. Transabdominal imaging limited secondary to body habitus. COMPARISON:  12/24/2013 FINDINGS: Uterus Measurements: 6.8 x 3.6 x 3.5 cm = volume: 45 mL. Anteverted. Normal morphology without mass Endometrium Thickness: 4 mm.  No endometrial fluid or focal abnormality Right ovary Not visualized, likely obscured by bowel Left ovary Measurements: 1.3 x 1.4 x 1.0 cm = volume: 0.9 mL. Questionably visualized. No gross abnormality. Other findings No free pelvic fluid.  No definite pelvic masses. IMPRESSION: Nonvisualization of RIGHT ovary and questionable visualization of a normal appearing but small LEFT ovary. Otherwise negative exam. Electronically Signed   By: Ulyses Southward M.D.   On: 05/18/2020 16:10   Patient was seen by myself in triage to discuss ultrasound results.  She was seen early this morning for right lower quadrant abdominal pain/pelvic pain and vaginal discharge.  Work-up at the time was significant for trichomonas and she was discharged with antibiotics.  Ultrasound was unavailable at the time so was ordered to be performed today.  We discussed that her right ovary was not visualized on ultrasound but that her ultrasound showed no endometrial fluid or free pelvic fluid.  She reports that her pain has improved since she was seen early this morning.  Her symptoms have been intermittent for weeks and I have a low suspicion of acute surgical abdominal pathology at this  time given duration of symptoms.  We discussed close follow-up with OB/GYN on an outpatient basis as well as the importance of completing her antibiotics.  Patient and her significant other who is at the bedside verbalized understanding of and agreement with plan and they are stable for discharge home at this time.   Jeanie Sewer, PA-C 05/18/20 1649    Vanetta Mulders, MD 05/18/20 1857

## 2020-05-18 NOTE — Discharge Instructions (Addendum)
You were seen today for lower abdominal pain.  Your urinalysis is reassuring.  You do not appear to be pregnant.  You did have trichomonas on your pelvic exam.  You were tested and treated for other STDs.  Return later today for an ultrasound to evaluate your ovaries.

## 2020-05-18 NOTE — ED Notes (Signed)
Prompted to change into gown for pelvic exam, cart at bedside.

## 2020-05-18 NOTE — ED Provider Notes (Signed)
[facility] EMERGENCY DEPARTMENT Provider Note   CSN: [id number] Arrival date & time: 05/18/20  0111     History Chief Complaint  Patient presents with  . abd pain    Natalie Ramsey is a 29 y.o. female.  HPI     This is a 29 year old female who presents with several week history of pelvic pain.  Patient reports that she has had some bloating, nausea, and pelvic discomfort.  It is mostly over the right lower quadrant.  It has been constant with gradual increasing of pain.  She states it is better when she sits and worse when she lays flat.  She states that she previously was having regular cycles after being placed on birth control by OB/GYN.  However, last cycle was March 13.  She questions whether she is pregnant.  She is primarily in a homosexual relationship with a female partner but states that she could be pregnant.  She has had negative pregnancy test at home.  She states "all my symptoms are like early pregnancy."  Denies concerns for STDs including multiple sexual partners, unprotected sex, vaginal discharge.  Denies dysuria.  Denies fevers.  Reports normal bowel movements.  Has not taken anything for her pain at home.  Currently rates her pain at 6 out of 10.  Chart reviewed.  Seen in January for dysfunctional uterine bleeding and oligomenorrhea.  Placed on birth control.  Thought to be secondary to PCOS.  Past Medical History:  Diagnosis Date  . Asthma   . Asthma   . Pyelonephritis     Patient Active Problem List   Diagnosis Date Noted  . 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 Healthcare maintenance 01/02/2014  . Fever, unspecified 12/27/2013  . Hypokalemia 12/25/2013  . Interstitial pneumonitis ([place]) 12/25/2013  . Sepsis ([place]) 12/24/2013  . Pyelonephritis 12/24/2013  . Asthma, chronic 12/24/2013    History reviewed. No pertinent surgical history.   OB History    Gravida  0   Para  0   Term  0   Preterm  0   AB  0   Living  0     SAB  0    TAB  0   Ectopic  0   Multiple  0   Live Births  0           No family history on file.  Social History   Tobacco Use  . Smoking status: Current Some Day Smoker    Packs/day: 0.50    Types: Cigarettes  . Smokeless tobacco: Never Used  Vaping Use  . Vaping Use: Never used  Substance Use Topics  . Alcohol use: Not Currently    Comment: occ  . Drug use: No    Home Medications Prior to Admission medications   Medication Sig Start Date End Date Taking? Authorizing Provider  albuterol (PROVENTIL) (2.5 MG/3ML) 0.083% nebulizer solution Take 3 mLs (2.5 mg total) by nebulization every 6 (six) hours as needed for wheezing or shortness of breath. 01/10/20   Dorie Rank, MD  albuterol (VENTOLIN HFA) 108 (90 Base) MCG/ACT inhaler Inhale 2 puffs into the lungs every 4 (four) hours as needed for wheezing or shortness of breath. 03/17/20   Lajean Saver, MD  doxycycline (VIBRAMYCIN) 100 MG capsule Take 1 capsule (100 mg total) by mouth 2 (two) times daily. 05/18/20   Icey Tello, Barbette Hair, MD  ibuprofen (ADVIL) 800 MG tablet Take 1 tablet (800 mg total) by mouth 3 (three) times daily. Patient not  taking: Reported on 03/17/2020 03/10/20   Darr, Veryl Speak, PA-C  megestrol (MEGACE) 40 MG tablet Take 1 tablet (40 mg total) by mouth 2 (two) times daily. Patient not taking: Reported on 03/17/2020 11/23/19   Little, Ambrose Finland, MD  naproxen (NAPROSYN) 500 MG tablet Take 1 tablet (500 mg total) by mouth 2 (two) times daily. 05/18/20   Maricia Scotti, Mayer Masker, MD  Norethindrone Acetate-Ethinyl Estrad-FE (LOESTRIN 24 FE) 1-20 MG-MCG(24) tablet Take 1 tablet by mouth daily. Patient not taking: Reported on 03/17/2020 12/13/19   Willodean Rosenthal, MD  predniSONE (DELTASONE) 20 MG tablet Take 3 tablets (60 mg total) by mouth daily. 03/18/20   Cathren Laine, MD  predniSONE (DELTASONE) 50 MG tablet Take 1 tablet (50 mg total) by mouth daily. Patient not taking: Reported on 03/17/2020 01/10/20   Linwood Dibbles, MD     Allergies    Ceftriaxone, Levaquin [levofloxacin in d5w], and Zosyn [piperacillin sod-tazobactam so]  Review of Systems   Review of Systems  Constitutional: Negative for fever.  Respiratory: Negative for shortness of breath.   Cardiovascular: Negative for chest pain.  Gastrointestinal: Positive for nausea. Negative for abdominal pain, constipation, diarrhea and vomiting.  Genitourinary: Positive for pelvic pain. Negative for dysuria, vaginal bleeding and vaginal discharge.  All other systems reviewed and are negative.   Physical Exam Updated Vital Signs BP 140/87 (BP Location: Right Arm)   Pulse (!) 102   Temp 98.4 F (36.9 C) (Oral)   Resp 20   Ht 1.778 m (5\' 10" )   Wt (!) 188.2 kg   LMP 02/08/2020 (Exact Date)   SpO2 98%   BMI 59.55 kg/m   Physical Exam Vitals and nursing note reviewed.  Constitutional:      Appearance: She is well-developed. She is obese. She is not ill-appearing.  HENT:     Head: Normocephalic and atraumatic.     Nose: Nose normal.     Mouth/Throat:     Mouth: Mucous membranes are moist.  Eyes:     Pupils: Pupils are equal, round, and reactive to light.  Cardiovascular:     Rate and Rhythm: Normal rate and regular rhythm.     Heart sounds: Normal heart sounds.  Pulmonary:     Effort: Pulmonary effort is normal. No respiratory distress.     Breath sounds: No wheezing.  Abdominal:     General: Bowel sounds are normal.     Palpations: Abdomen is soft.     Tenderness: There is abdominal tenderness.  Genitourinary:    Comments: Tenderness palpation of the right adnexa, no mass palpable, normal external vaginal exam, no discharge or cervical friability. Musculoskeletal:     Cervical back: Neck supple.     Right lower leg: No edema.     Left lower leg: No edema.  Skin:    General: Skin is warm and dry.  Neurological:     Mental Status: She is alert and oriented to person, place, and time.  Psychiatric:        Mood and Affect: Mood normal.      ED Results / Procedures / Treatments   Labs (all labs ordered are listed, but only abnormal results are displayed) Labs Reviewed  WET PREP, GENITAL - Abnormal; Notable for the following components:      Result Value   Trich, Wet Prep PRESENT (*)    WBC, Wet Prep HPF POC FEW (*)    All other components within normal limits  URINALYSIS, ROUTINE W REFLEX MICROSCOPIC -  Abnormal; Notable for the following components:   Hgb urine dipstick TRACE (*)    All other components within normal limits  URINALYSIS, MICROSCOPIC (REFLEX) - Abnormal; Notable for the following components:   Bacteria, UA FEW (*)    Trichomonas, UA PRESENT (*)    All other components within normal limits  PREGNANCY, URINE  GC/CHLAMYDIA PROBE AMP ([place]) NOT AT [facility]    EKG None  Radiology No results found.  Procedures Procedures (including critical care time)  Medications Ordered in ED Medications  ketorolac (TORADOL) 30 MG/ML injection 30 mg (30 mg Intramuscular Given 05/18/20 0224)  metroNIDAZOLE (FLAGYL) tablet 2,000 mg (2,000 mg Oral Given 05/18/20 0252)  azithromycin (ZITHROMAX) tablet 2,000 mg (2,000 mg Oral Given 05/18/20 0253)    ED Course  I have reviewed the triage vital signs and the nursing notes.  Pertinent labs & imaging results that were available during my care of the patient were reviewed by me and considered in my medical decision making (see chart for details).    MDM Rules/Calculators/A&P                           Patient presents with acute on chronic pelvic pain.  Right greater than left.  She is overall nontoxic and vital signs are reassuring.  Exam is somewhat limited secondary to body habitus.  She does have some right-sided adnexal tenderness without mass.  She has no signs or symptoms of peritonitis.  Considerations include but not limited to, pregnancy, ectopic pregnancy, STD, tubo-ovarian abscess, less likely ovarian torsion given constant nature of pain for several  weeks.  Also less likely appendicitis.  Pregnancy test is negative.  Urinalysis does not show any evidence of infection.  Wet prep does show trichomonas.  She was positive back in August as well.  Patient was treated with Flagyl.  She was given 2 g of azithromycin and will be discharged with doxycycline to cover for other STDs as well.  She has a significant cephalosporin allergy.  Patient was given Toradol for pain.  She has no signs of peritonitis on exam.  Will order ultrasound later today to evaluate her ovaries for cyst or other pathology.  Patient agreeable plan.  Will discharge with naproxen and doxycycline.  After history, exam, and medical workup I feel the patient has been appropriately medically screened and is safe for discharge home. Pertinent diagnoses were discussed with the patient. Patient was given return precautions.   Final Clinical Impression(s) / ED Diagnoses Final diagnoses:  Pelvic pain  Trichomoniasis    Rx / DC Orders ED Discharge Orders         Ordered    doxycycline (VIBRAMYCIN) 100 MG capsule  2 times daily     Discontinue  Reprint     05/18/20 0258    naproxen (NAPROSYN) 500 MG tablet  2 times daily     Discontinue  Reprint     05/18/20 0258    US PELVIC COMPLETE WITH TRANSVAGINAL     Discontinue     05/18/20 0259           Shon Baton, MD 05/18/20 [phone]

## 2020-05-19 LAB — GC/CHLAMYDIA PROBE AMP (~~LOC~~) NOT AT ARMC
Chlamydia: NEGATIVE
Comment: NEGATIVE
Comment: NORMAL
Neisseria Gonorrhea: NEGATIVE

## 2020-09-04 ENCOUNTER — Encounter (HOSPITAL_BASED_OUTPATIENT_CLINIC_OR_DEPARTMENT_OTHER): Payer: Self-pay | Admitting: *Deleted

## 2020-09-04 ENCOUNTER — Emergency Department (HOSPITAL_BASED_OUTPATIENT_CLINIC_OR_DEPARTMENT_OTHER)
Admission: EM | Admit: 2020-09-04 | Discharge: 2020-09-04 | Disposition: A | Discharge status: Home / Self Care | Payer: Self-pay | Attending: Emergency Medicine | Admitting: Emergency Medicine

## 2020-09-04 ENCOUNTER — Other Ambulatory Visit: Payer: Self-pay

## 2020-09-04 DIAGNOSIS — J45909 Unspecified asthma, uncomplicated: Secondary | ICD-10-CM | POA: Insufficient documentation

## 2020-09-04 DIAGNOSIS — F17210 Nicotine dependence, cigarettes, uncomplicated: Secondary | ICD-10-CM | POA: Insufficient documentation

## 2020-09-04 DIAGNOSIS — Z79899 Other long term (current) drug therapy: Secondary | ICD-10-CM | POA: Insufficient documentation

## 2020-09-04 DIAGNOSIS — K0889 Other specified disorders of teeth and supporting structures: Secondary | ICD-10-CM | POA: Insufficient documentation

## 2020-09-04 MED ORDER — CLINDAMYCIN HCL 150 MG PO CAPS
450.0000 mg | ORAL_CAPSULE | Freq: Three times a day (TID) | ORAL | 0 refills | Status: AC
Start: 2020-09-04 — End: 2020-09-11

## 2020-09-04 NOTE — Discharge Instructions (Signed)
I am prescribing you an antibiotic called clindamycin.  You are going to take this 3 times a day for the next week.  Please do not stop taking this early.  I recommend a combination of tylenol and ibuprofen for management of your pain. You can take a low dose of both at the same time. I recommend 325 mg of Tylenol combined with 400 mg of ibuprofen. This is one regular Tylenol and two regular ibuprofen. You can take these 2-3 times for day for your pain. Please try to take these medications with a small amount of food as well to prevent upsetting your stomach.  Please make sure you follow-up with your dental provider in 9 days at your scheduled appointment.  If your symptoms worsen you can always return to the ER for reevaluation.  It was a pleasure to meet you.

## 2020-09-04 NOTE — ED Provider Notes (Signed)
[facility] EMERGENCY DEPARTMENT Provider Note   CSN: [id number] Arrival date & time: 09/04/20  1440     History Chief Complaint  Patient presents with  . Dental Pain    Natalie Ramsey is a 29 y.o. female.  HPI Patient is a 29 year old female with a medical history as noted below.  Patient presents today with 2 weeks of worsening dental pain.  Reports pain in the left lower portion of her mouth.  States it worsens with chewing.  She has been taking Tylenol, ibuprofen, oxycodone, with mild short-term relief.  She has a follow-up appointment with a dentist in 9 days.  No fevers, chills, drooling, difficulty swallowing, sore throat, shortness of breath.    Past Medical History:  Diagnosis Date  . Asthma   . Asthma   . Pyelonephritis     Patient Active Problem List   Diagnosis Date Noted  . 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 Healthcare maintenance 01/02/2014  . Fever, unspecified 12/27/2013  . Hypokalemia 12/25/2013  . 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 Sepsis (HCC) 12/24/2013  . Pyelonephritis 12/24/2013  . Asthma, chronic 12/24/2013    History reviewed. No pertinent surgical history.   OB History    Gravida  0   Para  0   Term  0   Preterm  0   AB  0   Living  0     SAB  0   TAB  0   Ectopic  0   Multiple  0   Live Births  0           No family history on file.  Social History   Tobacco Use  . Smoking status: Current Some Day Smoker    Packs/day: 0.50    Types: Cigarettes  . Smokeless tobacco: Never Used  Vaping Use  . Vaping Use: Never used  Substance Use Topics  . Alcohol use: Not Currently    Comment: occ  . Drug use: No    Home Medications Prior to Admission medications   Medication Sig Start Date End Date Taking? Authorizing Provider  albuterol (PROVENTIL) (2.5 MG/3ML) 0.083% nebulizer solution Take 3 mLs (2.5 mg total) by nebulization every 6 (six) hours as needed for wheezing or shortness of breath.  01/10/20   Linwood Dibbles, MD  albuterol (VENTOLIN HFA) 108 (90 Base) MCG/ACT inhaler Inhale 2 puffs into the lungs every 4 (four) hours as needed for wheezing or shortness of breath. 03/17/20   Cathren Laine, MD  doxycycline (VIBRAMYCIN) 100 MG capsule Take 1 capsule (100 mg total) by mouth 2 (two) times daily. 05/18/20   Horton, Mayer Masker, MD  ibuprofen (ADVIL) 800 MG tablet Take 1 tablet (800 mg total) by mouth 3 (three) times daily. Patient not taking: Reported on 03/17/2020 03/10/20   Darr, Veryl Speak, PA-C  megestrol (MEGACE) 40 MG tablet Take 1 tablet (40 mg total) by mouth 2 (two) times daily. Patient not taking: Reported on 03/17/2020 11/23/19   Little, Ambrose Finland, MD  naproxen (NAPROSYN) 500 MG tablet Take 1 tablet (500 mg total) by mouth 2 (two) times daily. 05/18/20   Horton, Mayer Masker, MD  Norethindrone Acetate-Ethinyl Estrad-FE (LOESTRIN 24 FE) 1-20 MG-MCG(24) tablet Take 1 tablet by mouth daily. Patient not taking: Reported on 03/17/2020 12/13/19   Willodean Rosenthal, MD  predniSONE (DELTASONE) 20 MG tablet Take 3 tablets (60 mg total) by mouth daily. 03/18/20   Cathren Laine, MD  predniSONE (DELTASONE) 50 MG tablet Take 1 tablet (50  mg total) by mouth daily. Patient not taking: Reported on 03/17/2020 01/10/20   Linwood Dibbles, MD    Allergies    Ceftriaxone, Levaquin [levofloxacin in d5w], and Zosyn [piperacillin sod-tazobactam so]  Review of Systems   Review of Systems  Constitutional: Negative for chills and fever.  HENT: Positive for dental problem. Negative for drooling, sore throat and voice change.   Respiratory: Negative for shortness of breath.    Physical Exam Updated Vital Signs BP (!) 142/100 (BP Location: Left Arm)   Pulse 91   Temp 98.3 F (36.8 C)   Resp 18   Ht 5\' 10"  (1.778 m)   Wt (!) 213.2 kg   SpO2 99%   BMI 67.44 kg/m   Physical Exam Vitals and nursing note reviewed.  Constitutional:      General: She is not in acute distress.    Appearance: She is  well-developed.  HENT:     Head: Normocephalic and atraumatic.     Right Ear: Tympanic membrane, ear canal and external ear normal. There is no impacted cerumen.     Left Ear: Tympanic membrane, ear canal and external ear normal. There is no impacted cerumen.     Mouth/Throat:     Mouth: Mucous membranes are moist.     Pharynx: Oropharynx is clear. No oropharyngeal exudate or posterior oropharyngeal erythema.     Comments: Moderate TTP noted with manipulation of the left lower first molar.  Caries noted along this tooth as well as numerous other teeth in the mouth.  No palpable fluctuance or signs of abscess.  Uvula is midline.  Readily handling secretions.  No significant edema noted along the face.  No hot potato voice. Eyes:     General: No scleral icterus.       Right eye: No discharge.        Left eye: No discharge.     Conjunctiva/sclera: Conjunctivae normal.  Neck:     Trachea: No tracheal deviation.     Comments: No cervical lymphadenopathy noted. Cardiovascular:     Rate and Rhythm: Normal rate.  Pulmonary:     Effort: Pulmonary effort is normal. No respiratory distress.     Breath sounds: No stridor.  Abdominal:     General: There is no distension.  Musculoskeletal:        General: No swelling or deformity.     Cervical back: Normal range of motion and neck supple.  Skin:    General: Skin is warm and dry.     Findings: No rash.  Neurological:     Mental Status: She is alert.     Cranial Nerves: Cranial nerve deficit: no gross deficits.    ED Results / Procedures / Treatments   Labs (all labs ordered are listed, but only abnormal results are displayed) Labs Reviewed - No data to display  EKG None  Radiology No results found.  Procedures Procedures (including critical care time)  Medications Ordered in ED Medications - No data to display  ED Course  I have reviewed the triage vital signs and the nursing notes.  Pertinent labs & imaging results that were  available during my care of the patient were reviewed by me and considered in my medical decision making (see chart for details).    MDM Rules/Calculators/A&P                          Pt is a 29 y.o. female that presents with  a history, physical exam, and ED Clinical Course as noted above.   Patient presents today due to dental pain.  Physical exam significant for multiple dental caries and pain overlying the left lower first molar.  No visible or palpable fluctuance.  Patient is readily handling secretions.  Uvula is midline.  No visible swelling noted along the face.  Exam not concerning for Ludwig's angina or PTA at this time.  We will discharge on a course of clindamycin.  Patient has a follow-up appointment with a dentist in 9 days.  Recommended continued use of Tylenol and ibuprofen for her pain.  We discussed multiple over-the-counter products as well.  Her questions were answered and she was amicable at the time of discharge.  Her vital signs are stable.  She is afebrile, not tachycardic, not hypoxic.   An After Visit Summary was printed and given to the patient.  Patient discharged to home/self care.  Condition at discharge: Stable  Note: Portions of this report may have been transcribed using voice recognition software. Every effort was made to ensure accuracy; however, inadvertent computerized transcription errors may be present.   Final Clinical Impression(s) / ED Diagnoses Final diagnoses:  Pain, dental   Rx / DC Orders ED Discharge Orders         Ordered    clindamycin (CLEOCIN) 150 MG capsule  3 times daily        09/04/20 1511           Placido Sou, PA-C 09/04/20 1514    Tegeler, Canary Brim, MD 09/04/20 [phone]

## 2020-09-04 NOTE — ED Triage Notes (Signed)
C/o dental pain x 2 weeks 

## 2020-09-04 NOTE — ED Notes (Signed)
ED Provider at bedside. 

## 2020-10-27 ENCOUNTER — Emergency Department (HOSPITAL_BASED_OUTPATIENT_CLINIC_OR_DEPARTMENT_OTHER)
Admission: EM | Admit: 2020-10-27 | Discharge: 2020-10-27 | Disposition: A | Discharge status: Home / Self Care | Payer: Self-pay | Attending: Emergency Medicine | Admitting: Emergency Medicine

## 2020-10-27 ENCOUNTER — Encounter (HOSPITAL_BASED_OUTPATIENT_CLINIC_OR_DEPARTMENT_OTHER): Payer: Self-pay | Admitting: Emergency Medicine

## 2020-10-27 ENCOUNTER — Other Ambulatory Visit: Payer: Self-pay

## 2020-10-27 DIAGNOSIS — J4541 Moderate persistent asthma with (acute) exacerbation: Secondary | ICD-10-CM | POA: Insufficient documentation

## 2020-10-27 DIAGNOSIS — F17210 Nicotine dependence, cigarettes, uncomplicated: Secondary | ICD-10-CM | POA: Insufficient documentation

## 2020-10-27 DIAGNOSIS — Z20822 Contact with and (suspected) exposure to covid-19: Secondary | ICD-10-CM | POA: Insufficient documentation

## 2020-10-27 LAB — RESP PANEL BY RT-PCR (FLU A&B, COVID) ARPGX2
Influenza A by PCR: NEGATIVE
Influenza B by PCR: NEGATIVE
SARS Coronavirus 2 by RT PCR: NEGATIVE

## 2020-10-27 MED ORDER — IPRATROPIUM BROMIDE 0.02 % IN SOLN
0.5000 mg | Freq: Once | RESPIRATORY_TRACT | Status: AC
  Administered 2020-10-27: 0.5 mg via RESPIRATORY_TRACT
  Filled 2020-10-27: qty 2.5

## 2020-10-27 MED ORDER — ALBUTEROL SULFATE HFA 108 (90 BASE) MCG/ACT IN AERS
2.0000 | INHALATION_SPRAY | RESPIRATORY_TRACT | Status: DC | PRN
  Administered 2020-10-27: 2 via RESPIRATORY_TRACT
  Filled 2020-10-27: qty 6.7

## 2020-10-27 MED ORDER — PREDNISONE 10 MG PO TABS
20.0000 mg | ORAL_TABLET | Freq: Two times a day (BID) | ORAL | 0 refills | Status: DC
Start: 2020-10-27 — End: 2021-02-16

## 2020-10-27 MED ORDER — METHYLPREDNISOLONE SODIUM SUCC 125 MG IJ SOLR
125.0000 mg | Freq: Once | INTRAMUSCULAR | Status: AC
  Administered 2020-10-27: 125 mg via INTRAVENOUS
  Filled 2020-10-27: qty 2

## 2020-10-27 MED ORDER — ALBUTEROL SULFATE (2.5 MG/3ML) 0.083% IN NEBU
2.5000 mg | INHALATION_SOLUTION | RESPIRATORY_TRACT | 0 refills | Status: DC | PRN
Start: 2020-10-27 — End: 2021-02-16

## 2020-10-27 MED ORDER — ALBUTEROL (5 MG/ML) CONTINUOUS INHALATION SOLN
15.0000 mg/h | INHALATION_SOLUTION | RESPIRATORY_TRACT | Status: AC
  Administered 2020-10-27: 15 mg/h via RESPIRATORY_TRACT
  Filled 2020-10-27: qty 20

## 2020-10-27 NOTE — ED Triage Notes (Signed)
Pt having sob this am.  Took last inhaler puff this am.  Last nebulizer treatment 2 days ago.  Pt having audible wheezing, prolonged expirations and some pulling.

## 2020-10-27 NOTE — ED Notes (Signed)
Review D/C papers with pt, reviewed Rx with pt, pt states understanding, pt denies questions at this time. 

## 2020-10-27 NOTE — Discharge Instructions (Addendum)
Begin taking prednisone as prescribed.  Use albuterol nebulizer has prescribed every 4 hours as needed for wheezing.  Return to the emergency department if you develop worsening breathing, severe chest pain, high fever, or other new and concerning symptoms.

## 2020-10-27 NOTE — ED Provider Notes (Signed)
[facility] EMERGENCY DEPARTMENT Provider Note   CSN: [id number] Arrival date & time: 10/27/20  0751     History Chief Complaint  Patient presents with  . Shortness of Breath  . Asthma    Natalie Ramsey is a 29 y.o. female.  Patient is a 29 year old female with history of asthma.  She presents today for evaluation of wheezing and shortness of breath.  This is been ongoing for the past 2 days.  She had been using her nebulizer, however has since run out of albuterol.  Patient denies fevers or chills.  She denies productive cough.  Patient not immunized against COVID-19.  The history is provided by the patient.  Shortness of Breath Severity:  Moderate Onset quality:  Gradual Duration:  3 days Timing:  Intermittent Progression:  Worsening Chronicity:  Recurrent Worsened by:  Nothing Ineffective treatments:  None tried Associated symptoms: no cough and no fever        Past Medical History:  Diagnosis Date  . Asthma   . Asthma   . Pyelonephritis     Patient Active Problem List   Diagnosis Date Noted  . 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 Healthcare maintenance 01/02/2014  . Fever, unspecified 12/27/2013  . Hypokalemia 12/25/2013  . 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 Sepsis (HCC) 12/24/2013  . Pyelonephritis 12/24/2013  . Asthma, chronic 12/24/2013    No past surgical history on file.   OB History    Gravida  0   Para  0   Term  0   Preterm  0   AB  0   Living  0     SAB  0   TAB  0   Ectopic  0   Multiple  0   Live Births  0           No family history on file.  Social History   Tobacco Use  . Smoking status: Current Some Day Smoker    Packs/day: 0.50    Types: Cigarettes  . Smokeless tobacco: Never Used  Vaping Use  . Vaping Use: Never used  Substance Use Topics  . Alcohol use: Not Currently    Comment: occ  . Drug use: No    Home Medications Prior to Admission medications   Medication Sig Start  Date End Date Taking? Authorizing Provider  albuterol (PROVENTIL) (2.5 MG/3ML) 0.083% nebulizer solution Take 3 mLs (2.5 mg total) by nebulization every 6 (six) hours as needed for wheezing or shortness of breath. 01/10/20   Linwood Dibbles, MD  albuterol (VENTOLIN HFA) 108 (90 Base) MCG/ACT inhaler Inhale 2 puffs into the lungs every 4 (four) hours as needed for wheezing or shortness of breath. 03/17/20   Cathren Laine, MD  doxycycline (VIBRAMYCIN) 100 MG capsule Take 1 capsule (100 mg total) by mouth 2 (two) times daily. 05/18/20   Horton, Mayer Masker, MD  ibuprofen (ADVIL) 800 MG tablet Take 1 tablet (800 mg total) by mouth 3 (three) times daily. Patient not taking: Reported on 03/17/2020 03/10/20   Darr, Gerilyn Pilgrim, PA-C  megestrol (MEGACE) 40 MG tablet Take 1 tablet (40 mg total) by mouth 2 (two) times daily. Patient not taking: Reported on 03/17/2020 11/23/19   Little, Ambrose Finland, MD  naproxen (NAPROSYN) 500 MG tablet Take 1 tablet (500 mg total) by mouth 2 (two) times daily. 05/18/20   Horton, Mayer Masker, MD  Norethindrone Acetate-Ethinyl Estrad-FE (LOESTRIN 24 FE) 1-20 MG-MCG(24) tablet Take 1 tablet by mouth daily. Patient not taking:  Reported on 03/17/2020 12/13/19   Willodean Rosenthal, MD  predniSONE (DELTASONE) 20 MG tablet Take 3 tablets (60 mg total) by mouth daily. 03/18/20   Cathren Laine, MD  predniSONE (DELTASONE) 50 MG tablet Take 1 tablet (50 mg total) by mouth daily. Patient not taking: Reported on 03/17/2020 01/10/20   Linwood Dibbles, MD    Allergies    Ceftriaxone, Levaquin [levofloxacin in d5w], and Zosyn [piperacillin sod-tazobactam so]  Review of Systems   Review of Systems  Constitutional: Negative for fever.  Respiratory: Negative for cough.   All other systems reviewed and are negative.   Physical Exam Updated Vital Signs Pulse 90   Temp 98.4 F (36.9 C) (Oral)   Resp 20   SpO2 95%   Physical Exam Vitals and nursing note reviewed.  Constitutional:      General: She is  not in acute distress.    Appearance: She is well-developed. She is not diaphoretic.  HENT:     Head: Normocephalic and atraumatic.  Cardiovascular:     Rate and Rhythm: Normal rate and regular rhythm.     Heart sounds: No murmur heard.  No friction rub. No gallop.   Pulmonary:     Effort: Pulmonary effort is normal. Tachypnea present. No accessory muscle usage or respiratory distress.     Breath sounds: No stridor. Examination of the right-middle field reveals wheezing. Examination of the left-middle field reveals wheezing. Wheezing present.  Abdominal:     General: Bowel sounds are normal. There is no distension.     Palpations: Abdomen is soft.     Tenderness: There is no abdominal tenderness.  Musculoskeletal:        General: Normal range of motion.     Cervical back: Normal range of motion and neck supple.  Skin:    General: Skin is warm and dry.  Neurological:     Mental Status: She is alert and oriented to person, place, and time.     ED Results / Procedures / Treatments   Labs (all labs ordered are listed, but only abnormal results are displayed) Labs Reviewed - No data to display  EKG None  Radiology No results found.  Procedures Procedures (including critical care time)  Medications Ordered in ED Medications  albuterol (PROVENTIL,VENTOLIN) solution continuous neb (has no administration in time range)  ipratropium (ATROVENT) nebulizer solution 0.5 mg (has no administration in time range)  methylPREDNISolone sodium succinate (SOLU-MEDROL) 125 mg/2 mL injection 125 mg (has no administration in time range)    ED Course  I have reviewed the triage vital signs and the nursing notes.  Pertinent labs & imaging results that were available during my care of the patient were reviewed by me and considered in my medical decision making (see chart for details).    MDM Rules/Calculators/A&P  Patient presenting here with complaints of shortness of breath and wheezing.   She has audible wheezes on exam.  Presentation most consistent with an exacerbation of asthma.  Covid test is negative.  She is feeling better after receiving an hour-long neb along with Solu-Medrol.  She seems appropriate for discharge.  Her albuterol solution will be refilled and prednisone prescribed.  To return as needed if symptoms worsen.  Final Clinical Impression(s) / ED Diagnoses Final diagnoses:  None    Rx / DC Orders ED Discharge Orders    None       Geoffery Lyons, MD 10/27/20 [phone]

## 2020-12-06 ENCOUNTER — Other Ambulatory Visit: Payer: Self-pay

## 2020-12-06 ENCOUNTER — Encounter (HOSPITAL_BASED_OUTPATIENT_CLINIC_OR_DEPARTMENT_OTHER): Payer: Self-pay

## 2020-12-06 ENCOUNTER — Emergency Department (HOSPITAL_BASED_OUTPATIENT_CLINIC_OR_DEPARTMENT_OTHER)
Admission: EM | Admit: 2020-12-06 | Discharge: 2020-12-06 | Disposition: A | Discharge status: Home / Self Care | Payer: HRSA Program | Attending: Emergency Medicine | Admitting: Emergency Medicine

## 2020-12-06 DIAGNOSIS — U071 COVID-19: Secondary | ICD-10-CM | POA: Insufficient documentation

## 2020-12-06 DIAGNOSIS — R030 Elevated blood-pressure reading, without diagnosis of hypertension: Secondary | ICD-10-CM | POA: Diagnosis not present

## 2020-12-06 DIAGNOSIS — F17210 Nicotine dependence, cigarettes, uncomplicated: Secondary | ICD-10-CM | POA: Diagnosis not present

## 2020-12-06 DIAGNOSIS — R112 Nausea with vomiting, unspecified: Secondary | ICD-10-CM | POA: Diagnosis not present

## 2020-12-06 DIAGNOSIS — R509 Fever, unspecified: Secondary | ICD-10-CM | POA: Diagnosis present

## 2020-12-06 DIAGNOSIS — J111 Influenza due to unidentified influenza virus with other respiratory manifestations: Secondary | ICD-10-CM

## 2020-12-06 DIAGNOSIS — J45909 Unspecified asthma, uncomplicated: Secondary | ICD-10-CM | POA: Diagnosis not present

## 2020-12-06 DIAGNOSIS — Z20822 Contact with and (suspected) exposure to covid-19: Secondary | ICD-10-CM

## 2020-12-06 MED ORDER — ACETAMINOPHEN 325 MG PO TABS
650.0000 mg | ORAL_TABLET | Freq: Once | ORAL | Status: AC | PRN
  Administered 2020-12-06: 650 mg via ORAL
  Filled 2020-12-06: qty 2

## 2020-12-06 MED ORDER — IBUPROFEN 400 MG PO TABS
600.0000 mg | ORAL_TABLET | Freq: Once | ORAL | Status: AC
  Administered 2020-12-06: 600 mg via ORAL
  Filled 2020-12-06: qty 1

## 2020-12-06 MED ORDER — ONDANSETRON 4 MG PO TBDP
4.0000 mg | ORAL_TABLET | Freq: Three times a day (TID) | ORAL | 0 refills | Status: DC | PRN
Start: 2020-12-06 — End: 2021-04-15

## 2020-12-06 MED ORDER — ONDANSETRON 4 MG PO TBDP
4.0000 mg | ORAL_TABLET | Freq: Once | ORAL | Status: AC | PRN
  Administered 2020-12-06: 4 mg via ORAL
  Filled 2020-12-06: qty 1

## 2020-12-06 MED ORDER — ACETAMINOPHEN 325 MG PO TABS: 650.0000 mg | ORAL_TABLET | Freq: Four times a day (QID) | ORAL | 0 refills | Status: DC | PRN

## 2020-12-06 MED ORDER — IBUPROFEN 600 MG PO TABS
600.0000 mg | ORAL_TABLET | Freq: Four times a day (QID) | ORAL | 0 refills | Status: DC | PRN
Start: 2020-12-06 — End: 2021-04-15

## 2020-12-06 NOTE — ED Triage Notes (Signed)
Pt had covid exposure, started with fever yesterday. Complains of weakness, body aches, headache, cough, nausea, vomiting. Took tylenol at 0400. Pt moaning in triage.

## 2020-12-06 NOTE — ED Provider Notes (Signed)
[facility] EMERGENCY DEPARTMENT Provider Note   CSN: [id number] Arrival date & time: 12/06/20  1422     History Chief Complaint  Patient presents with  . Fever    Covid exposure    Natalie Ramsey is a 30 y.o. female.  Patient with history of asthma presents to the emergency department today for evaluation of fevers, body aches, headache, fatigue, nausea and vomiting, occasional cough.  She states that she was exposed to a family member who had Covid.  She states that she feels like her asthma symptoms are well controlled currently.  No diarrhea or urinary symptoms.  She has been taking over-the-counter medication with improvement.  The onset of this condition was acute. The course is constant. Aggravating factors: none.         Past Medical History:  Diagnosis Date  . Asthma   . Asthma   . Pyelonephritis     Patient Active Problem List   Diagnosis Date Noted  . 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 Healthcare maintenance 01/02/2014  . Fever, unspecified 12/27/2013  . Hypokalemia 12/25/2013  . 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 Sepsis (HCC) 12/24/2013  . Pyelonephritis 12/24/2013  . Asthma, chronic 12/24/2013    History reviewed. No pertinent surgical history.   OB History    Gravida  0   Para  0   Term  0   Preterm  0   AB  0   Living  0     SAB  0   IAB  0   Ectopic  0   Multiple  0   Live Births  0           History reviewed. No pertinent family history.  Social History   Tobacco Use  . Smoking status: Current Some Day Smoker    Packs/day: 0.50    Types: Cigarettes  . Smokeless tobacco: Never Used  Vaping Use  . Vaping Use: Never used  Substance Use Topics  . Alcohol use: Not Currently    Comment: occ  . Drug use: No    Home Medications Prior to Admission medications   Medication Sig Start Date End Date Taking? Authorizing Provider  albuterol (PROVENTIL) (2.5 MG/3ML) 0.083% nebulizer solution Take 3 mLs  (2.5 mg total) by nebulization every 4 (four) hours as needed for wheezing or shortness of breath. 10/27/20   Geoffery Lyons, MD  doxycycline (VIBRAMYCIN) 100 MG capsule Take 1 capsule (100 mg total) by mouth 2 (two) times daily. 05/18/20   Horton, Mayer Masker, MD  ibuprofen (ADVIL) 800 MG tablet Take 1 tablet (800 mg total) by mouth 3 (three) times daily. Patient not taking: Reported on 03/17/2020 03/10/20   Darr, Gerilyn Pilgrim, PA-C  megestrol (MEGACE) 40 MG tablet Take 1 tablet (40 mg total) by mouth 2 (two) times daily. Patient not taking: Reported on 03/17/2020 11/23/19   Little, Ambrose Finland, MD  naproxen (NAPROSYN) 500 MG tablet Take 1 tablet (500 mg total) by mouth 2 (two) times daily. 05/18/20   Horton, Mayer Masker, MD  Norethindrone Acetate-Ethinyl Estrad-FE (LOESTRIN 24 FE) 1-20 MG-MCG(24) tablet Take 1 tablet by mouth daily. Patient not taking: Reported on 03/17/2020 12/13/19   Willodean Rosenthal, MD  predniSONE (DELTASONE) 10 MG tablet Take 2 tablets (20 mg total) by mouth 2 (two) times daily. 10/27/20   Geoffery Lyons, MD    Allergies    Ceftriaxone, Levaquin [levofloxacin in d5w], and Zosyn [piperacillin sod-tazobactam so]  Review of Systems   Review of Systems  Constitutional: Positive for chills, fatigue and fever.  HENT: Negative for congestion, ear pain, rhinorrhea, sinus pressure and sore throat.   Eyes: Negative for redness.  Respiratory: Positive for cough and shortness of breath. Negative for wheezing.   Gastrointestinal: Positive for nausea and vomiting. Negative for abdominal pain and diarrhea.  Genitourinary: Negative for dysuria.  Musculoskeletal: Positive for myalgias. Negative for neck stiffness.  Skin: Negative for rash.  Neurological: Positive for headaches.  Hematological: Negative for adenopathy.    Physical Exam Updated Vital Signs BP (!) 153/126 (BP Location: Right Arm)   Pulse 84   Temp (!) 101.7 F (38.7 C) (Oral)   Resp (!) 28   Ht 5\' 11"  (1.803 m)   Wt  (!) 213.2 kg   SpO2 97%   BMI 65.55 kg/m   Physical Exam Vitals and nursing note reviewed.  Constitutional:      Appearance: She is well-developed and well-nourished.  HENT:     Head: Normocephalic and atraumatic.     Jaw: No trismus.     Right Ear: Tympanic membrane, ear canal and external ear normal.     Left Ear: Tympanic membrane, ear canal and external ear normal.     Nose: Nose normal. No mucosal edema or rhinorrhea.     Mouth/Throat:     Mouth: Oropharynx is clear and moist and mucous membranes are normal. Mucous membranes are not dry. No oral lesions.     Pharynx: Uvula midline. No oropharyngeal exudate, posterior oropharyngeal edema, posterior oropharyngeal erythema or uvula swelling.     Tonsils: No tonsillar abscesses.  Eyes:     General:        Right eye: No discharge.        Left eye: No discharge.     Conjunctiva/sclera: Conjunctivae normal.  Cardiovascular:     Rate and Rhythm: Normal rate and regular rhythm.     Heart sounds: Normal heart sounds.  Pulmonary:     Effort: Pulmonary effort is normal. No respiratory distress.     Breath sounds: Wheezing present. No rales.     Comments: Minimal wheeze.  Abdominal:     Palpations: Abdomen is soft.     Tenderness: There is no abdominal tenderness.  Musculoskeletal:     Cervical back: Normal range of motion and neck supple.  Lymphadenopathy:     Cervical: No cervical adenopathy.  Skin:    General: Skin is warm and dry.  Neurological:     Mental Status: She is alert.  Psychiatric:        Mood and Affect: Mood and affect normal.     ED Results / Procedures / Treatments   Labs (all labs ordered are listed, but only abnormal results are displayed) Labs Reviewed  SARS CORONAVIRUS 2 (TAT 6-24 HRS)    EKG None  Radiology No results found.  Procedures Procedures (including critical care time)  Medications Ordered in ED Medications  acetaminophen (TYLENOL) tablet 650 mg (650 mg Oral Given 12/06/20 1445)   ondansetron (ZOFRAN-ODT) disintegrating tablet 4 mg (4 mg Oral Given 12/06/20 1445)  ibuprofen (ADVIL) tablet 600 mg (600 mg Oral Given 12/06/20 1902)    ED Course  I have reviewed the triage vital signs and the nursing notes.  Pertinent labs & imaging results that were available during my care of the patient were reviewed by me and considered in my medical decision making (see chart for details).  Patient seen and examined. Medications ordered. COVID test ordered.   Vital signs reviewed  and are as follows: BP 134/90 (BP Location: Right Arm)   Pulse 90   Temp 100 F (37.8 C) (Oral)   Resp (!) 22   Ht 5\' 11"  (1.803 m)   Wt (!) 213.2 kg   SpO2 100%   BMI 65.55 kg/m   Detailed discussion had with with patient regarding COVID-19 precautions and written instructions given as well.  We discussed need to isolate themselves for 5 days from onset of symptoms and have 24 hours of improvement prior to breaking isolation.  We discussed that when breaking isolation, mask wearing for 5 additional days is required.  We discussed signs symptoms to return which include worsening shortness of breath, trouble breathing, or increased work of breathing.  Also return with persistent vomiting, confusion, passing out, or if they have any other concerns. Counseled on the need for rest and good hydration. Discussed that high-risk contacts should be aware of positive result and they need to quarantine and be tested if they develop any symptoms. Patient verbalizes understanding.   CHERESA SIERS was evaluated in Emergency Department on 12/06/2020 for the symptoms described in the history of present illness. She was evaluated in the context of the global COVID-19 pandemic, which necessitated consideration that the patient might be at risk for infection with the SARS-CoV-2 virus that causes COVID-19. Institutional protocols and algorithms that pertain to the evaluation of patients at risk for COVID-19 are in a state of rapid  change based on information released by regulatory bodies including the CDC and federal and state organizations. These policies and algorithms were followed during the patient's care in the ED.      MDM Rules/Calculators/A&P                          Patient with flulike symptoms, highly concerning for Covid infection.  Patient will be tested.  At this point she is not hypoxic.  Symptoms are relatively controlled with Tylenol and Zofran here.  No active vomiting.  Home with Zofran, NSAIDs and Tylenol for supportive care.  We discussed signs and symptoms to return as above.   Final Clinical Impression(s) / ED Diagnoses Final diagnoses:  Influenza-like illness  Encounter for laboratory testing for COVID-19 virus    Rx / DC Orders ED Discharge Orders         Ordered    ondansetron (ZOFRAN ODT) 4 MG disintegrating tablet  Every 8 hours PRN        12/06/20 1850    acetaminophen (TYLENOL) 325 MG tablet  Every 6 hours PRN        12/06/20 1850    ibuprofen (ADVIL) 600 MG tablet  Every 6 hours PRN        12/06/20 1850           02/03/21, PA-C 12/06/20 1905    02/03/21, DO 12/06/20 2318

## 2020-12-06 NOTE — Discharge Instructions (Signed)
Please read and follow all provided instructions.  Your diagnoses today include:  1. Influenza-like illness   2. Encounter for laboratory testing for COVID-19 virus     Tests performed today include:  Vital signs. See below for your results today.   COVID test - pending, check mychart for results  Medications prescribed:   Zofran (ondansetron) - for nausea and vomiting   Ibuprofen (Motrin, Advil) - anti-inflammatory pain medication  Do not exceed 600mg  ibuprofen every 6 hours, take with food  You have been prescribed an anti-inflammatory medication or NSAID. Take with food. Take smallest effective dose for the shortest duration needed for your pain. Stop taking if you experience stomach pain or vomiting.    Take any prescribed medications only as directed. Treatment for your infection is aimed at treating the symptoms. There are no medications, such as antibiotics, that will cure your infection.   Home care instructions:  Follow any educational materials contained in this packet.   Your illness is contagious and can be spread to others, especially during the first 3 or 4 days. It cannot be cured by antibiotics or other medicines. Take basic precautions such as washing your hands often, covering your mouth when you cough or sneeze, and avoiding public places where you could spread your illness to others.   Please continue drinking plenty of fluids.  Use over-the-counter medicines as needed as directed on packaging for symptom relief.  You may also use ibuprofen or tylenol as directed on packaging for pain or fever.  Do not take multiple medicines containing Tylenol or acetaminophen to avoid taking too much of this medication.  If you are positive for Covid-19, you should isolate yourself and not be exposed to other people for 5 days after your symptoms began. If you are not feeling better at day 5, you need to isolate yourself for a total of 10 days. If you are feeling better by day  5, you should wear a mask properly, over your nose and mouth, at all times while around other people until 10 days after your symptoms started.   Follow-up instructions: Please follow-up with your primary care provider as needed for further evaluation of your symptoms if you are not feeling better.   Return instructions:   Please return to the Emergency Department if you experience worsening symptoms.   Return to the emergency department if you have worsening shortness of breath breathing or increased work of breathing, persistent vomiting  RETURN IMMEDIATELY IF you develop shortness of breath, confusion or altered mental status, a new rash, become dizzy, faint, or poorly responsive, or are unable to be cared for at home.  Please return if you have persistent vomiting and cannot keep down fluids or develop a fever that is not controlled by tylenol or motrin.    Please return if you have any other emergent concerns.  Additional Information:  Your vital signs today were: BP (!) 153/126 (BP Location: Right Arm)    Pulse 84    Temp (!) 101.7 F (38.7 C) (Oral)    Resp (!) 28    Ht 5\' 11"  (1.803 m)    Wt (!) 213.2 kg    SpO2 97%    BMI 65.55 kg/m  If your blood pressure (BP) was elevated above 135/85 this visit, please have this repeated by your doctor within one month. --------------

## 2020-12-07 LAB — SARS CORONAVIRUS 2 (TAT 6-24 HRS): SARS Coronavirus 2: POSITIVE — AB

## 2020-12-08 ENCOUNTER — Telehealth: Payer: Self-pay | Admitting: Family

## 2020-12-08 ENCOUNTER — Other Ambulatory Visit: Payer: Self-pay | Admitting: Family

## 2020-12-08 DIAGNOSIS — Z6841 Body Mass Index (BMI) 40.0 and over, adult: Secondary | ICD-10-CM

## 2020-12-08 DIAGNOSIS — J4540 Moderate persistent asthma, uncomplicated: Secondary | ICD-10-CM

## 2020-12-08 DIAGNOSIS — U071 COVID-19: Secondary | ICD-10-CM

## 2020-12-08 NOTE — Telephone Encounter (Signed)
Called to discuss with patient about COVID-19 symptoms and the use of one of the available treatments for those with mild to moderate Covid symptoms and at a high risk of hospitalization.  Pt appears to qualify for outpatient treatment due to co-morbid conditions and/or a member of an at-risk group in accordance with the FDA Emergency Use Authorization.    Symptom onset: 12/06/19 Vaccinated: No  Booster? No Qualifiers: BMI of 65, High SVI score, Asthma  Ms. Natalie Ramsey was seen at the [facility] ED on 1/9 with body aches, fever, headache, fatigue, nausea, cough and vomiting. Found to be Covid positive. Continues to have similar symptoms as before.   We discussed the risks, benefits, and potential financial costs associated with treatment with Sotrovimab and she wishes to continue with treatment.   Hello Baldemar Friday,   We contacted you because you were recently diagnosed with COVID-19 and may benefit from a new treatment for mild to moderate disease. This treatment helps reduce the chance of being hospitalized. For some patients with medical conditions that may increase the chances of an infection, the treatment also decreases the risk for serious symptoms related to COVID-19.   The Food and Drug Administration (FDA) approved emergency use of a new drug to treat patients with mild to moderate symptoms who have risk factors that could cause severe symptoms related to COVID-19. This new treatment is a monoclonal antibody. It works by attaching like a magnet to the SARS-CoV2 virus (the virus that causes COVID-19) and stops it from infecting more cells in your body. It does not kill the virus, but it prevents it from spreading throughout your body with the hope that it will decrease your symptoms after it is administered.   This new drug is an intravenous (IV) infusion called Sotrovimab that is given over one 30-minute session in our [facility] outpatient infusion clinic. You will need to stay about  60 minutes after the infusion to ensure you are tolerating it well and to watch for any allergic reaction to the medication. More information will be given to you at the time of your appointment.   Important information:  . The potential side effects: 2-4% of recipients experience nausea, vomiting, diarrhea, dizziness, headaches, itching, worsening fevers or chills for around 24 hours. . There have been no serious infusion-related reactions. . Of the more than 3,000 patients who received the infusion, only one had an allergic response that ended once the infusion was stopped. This is why we monitor all of our patients closely for 60 minutes after the infusion.  . The COVID-19 vaccine (including boosters) must be delayed at least 90 days after receiving this infusion.  . The medication itself is free, but your insurance will be charged an infusion fee. The amount you may owe later varies from insurance to insurance. If you do not have insurance, we can put you in touch with our billing department. Please contact your insurance agent to discuss prior to your appointment if you would like further details about billing specific to your policy. The CMS code is: M10  . If you have been tested outside of a [facility], you MUST bring a copy of your positive test with you the morning of your appointment. You may take a photo of this and upload to your MyChart portal,  have the testing facility fax the result to [phone] or email a copy to [email or web address].    You have been scheduled to receive the monoclonal antibody  therapy at [facility]:  12/09/20 at 11:30 am   The address for the infusion clinic site is:   --The GPS address is [address], and the parking is located near the [employer] building where you will see a "COVID19 Infusion" feather banner marking the entrance to parking. (See photos below.)            --Enter into the 2nd entrance where the "wave, flag  banner" is at the road. Turn into this 2nd entrance and immediately turn left or right to park in one of the marked spaces.   --Please stay in your car and call the desk for assistance inside at [phone]. Let us know which space you are in.    --The average time in the department is roughly 90 minutes to two hours for monoclonal treatment. This includes preparation of the medication, IV start and the required 60-minute monitoring after the infusion.     Should you develop worsening shortness of breath, chest pain or severe breathing problems, please do not wait for this appointment and go to the emergency room for evaluation and treatment instead. You will undergo another oxygen screen before your infusion to ensure this is the best treatment option for you. There is a chance that the best decision may be to send you to the emergency room for evaluation at the time of your appointment.    The day of your visit, you should: Marland Kitchen Get plenty of rest the night before and drink plenty of water. . Eat a light meal/snack before coming and take your medications as prescribed.  . Wear warm, comfortable clothes with a shirt that can roll-up over the elbow (will need IV start).  . Wear a mask.  . Consider bringing an activity to help pass the time.  Marcos Eke, NP 12/08/2020 10:20 AM

## 2020-12-08 NOTE — Progress Notes (Signed)
I connected by phone with Natalie Ramsey on 12/08/2020 at 10:21 AM to discuss the potential use of a new treatment for mild to moderate COVID-19 viral infection in non-hospitalized patients.  This patient is a 30 y.o. female that meets the FDA criteria for Emergency Use Authorization of COVID monoclonal antibody sotrovimab.  Has a (+) direct SARS-CoV-2 viral test result  Has mild or moderate COVID-19   Is NOT hospitalized due to COVID-19  Is within 10 days of symptom onset  Has at least one of the high risk factor(s) for progression to severe COVID-19 and/or hospitalization as defined in EUA.  Specific high risk criteria : BMI > 25, Chronic Lung Disease and Other high risk medical condition per CDC:  High social risk score   I have spoken and communicated the following to the patient or parent/caregiver regarding COVID monoclonal antibody treatment:  1. FDA has authorized the emergency use for the treatment of mild to moderate COVID-19 in adults and pediatric patients with positive results of direct SARS-CoV-2 viral testing who are 12 years of age and older weighing at least 40 kg, and who are at high risk for progressing to severe COVID-19 and/or hospitalization.  2. The significant known and potential risks and benefits of COVID monoclonal antibody, and the extent to which such potential risks and benefits are unknown.  3. Information on available alternative treatments and the risks and benefits of those alternatives, including clinical trials.  4. Patients treated with COVID monoclonal antibody should continue to self-isolate and use infection control measures (e.g., wear mask, isolate, social distance, avoid sharing personal items, clean and disinfect "high touch" surfaces, and frequent handwashing) according to CDC guidelines.   5. The patient or parent/caregiver has the option to accept or refuse COVID monoclonal antibody treatment.  After reviewing this information with the  patient, the patient has agreed to receive one of the available covid 19 monoclonal antibodies and will be provided an appropriate fact sheet prior to infusion.   [name], FNP 12/08/2020 10:21 AM

## 2020-12-09 ENCOUNTER — Ambulatory Visit (HOSPITAL_COMMUNITY)
Admission: RE | Admit: 2020-12-09 | Discharge: 2020-12-09 | Disposition: A | Discharge status: Home / Self Care | Payer: HRSA Program | Source: Ambulatory Visit | Attending: Pulmonary Disease | Admitting: Pulmonary Disease

## 2020-12-09 DIAGNOSIS — Z6841 Body Mass Index (BMI) 40.0 and over, adult: Secondary | ICD-10-CM | POA: Insufficient documentation

## 2020-12-09 DIAGNOSIS — J4540 Moderate persistent asthma, uncomplicated: Secondary | ICD-10-CM | POA: Diagnosis not present

## 2020-12-09 DIAGNOSIS — U071 COVID-19: Secondary | ICD-10-CM | POA: Insufficient documentation

## 2020-12-09 MED ORDER — FAMOTIDINE IN NACL 20-0.9 MG/50ML-% IV SOLN: 20.0000 mg | Freq: Once | INTRAVENOUS | Status: DC | PRN

## 2020-12-09 MED ORDER — ACETAMINOPHEN 325 MG PO TABS
650.0000 mg | ORAL_TABLET | Freq: Once | ORAL | Status: AC
  Administered 2020-12-09: 650 mg via ORAL
  Filled 2020-12-09: qty 2

## 2020-12-09 MED ORDER — DIPHENHYDRAMINE HCL 50 MG/ML IJ SOLN: 50.0000 mg | Freq: Once | INTRAMUSCULAR | Status: DC | PRN

## 2020-12-09 MED ORDER — ALBUTEROL SULFATE HFA 108 (90 BASE) MCG/ACT IN AERS: 2.0000 | INHALATION_SPRAY | Freq: Once | RESPIRATORY_TRACT | Status: DC | PRN

## 2020-12-09 MED ORDER — SOTROVIMAB 500 MG/8ML IV SOLN
500.0000 mg | Freq: Once | INTRAVENOUS | Status: AC
  Administered 2020-12-09: 500 mg via INTRAVENOUS

## 2020-12-09 MED ORDER — METHYLPREDNISOLONE SODIUM SUCC 125 MG IJ SOLR: 125.0000 mg | Freq: Once | INTRAMUSCULAR | Status: DC | PRN

## 2020-12-09 MED ORDER — EPINEPHRINE 0.3 MG/0.3ML IJ SOAJ: 0.3000 mg | Freq: Once | INTRAMUSCULAR | Status: DC | PRN

## 2020-12-09 MED ORDER — SODIUM CHLORIDE 0.9 % IV SOLN: INTRAVENOUS | Status: DC | PRN

## 2020-12-09 NOTE — Progress Notes (Signed)
Diagnosis: COVID-19  Physician: Dr. Patrick Wright  Procedure: Covid Infusion Clinic Med: Sotrovimab infusion - Provided patient with sotrovimab fact sheet for patients, parents, and caregivers prior to infusion.   Complications: No immediate complications noted  Discharge: Discharged home    

## 2020-12-09 NOTE — Discharge Instructions (Signed)
10 Things You Can Do to Manage Your COVID-19 Symptoms at Home If you have possible or confirmed COVID-19: 1. Stay home except to get medical care. 2. Monitor your symptoms carefully. If your symptoms get worse, call your healthcare provider immediately. 3. Get rest and stay hydrated. 4. If you have a medical appointment, call the healthcare provider ahead of time and tell them that you have or may have COVID-19. 5. For medical emergencies, call 911 and notify the dispatch personnel that you have or may have COVID-19. 6. Cover your cough and sneezes with a tissue or use the inside of your elbow. 7. Wash your hands often with soap and water for at least 20 seconds or clean your hands with an alcohol-based hand sanitizer that contains at least 60% alcohol. 8. As much as possible, stay in a specific room and away from other people in your home. Also, you should use a separate bathroom, if available. If you need to be around other people in or outside of the home, wear a mask. 9. Avoid sharing personal items with other people in your household, like dishes, towels, and bedding. 10. Clean all surfaces that are touched often, like counters, tabletops, and doorknobs. Use household cleaning sprays or wipes according to the label instructions. cdc.gov/coronavirus 06/12/2020 This information is not intended to replace advice given to you by your health care provider. Make sure you discuss any questions you have with your health care provider. Document Revised: 09/28/2020 Document Reviewed: 09/28/2020 Elsevier Patient Education  2021 Elsevier Inc.  What types of side effects do monoclonal antibody drugs cause?  Common side effects  In general, the more common side effects caused by monoclonal antibody drugs include: . Allergic reactions, such as hives or itching . Flu-like signs and symptoms, including chills, fatigue, fever, and muscle aches and pains . Nausea, vomiting . Diarrhea . Skin  rashes . Low blood pressure   The CDC is recommending patients who receive monoclonal antibody treatments wait at least 90 days before being vaccinated.  Currently, there are no data on the safety and efficacy of mRNA COVID-19 vaccines in persons who received monoclonal antibodies or convalescent plasma as part of COVID-19 treatment. Based on the estimated half-life of such therapies as well as evidence suggesting that reinfection is uncommon in the 90 days after initial infection, vaccination should be deferred for at least 90 days, as a precautionary measure until additional information becomes available, to avoid interference of the antibody treatment with vaccine-induced immune responses.  If you have any questions or concerns after the infusion please call the Advanced Practice Provider on call at [phone]. This number is ONLY intended for your use regarding questions or concerns about the infusion post-treatment side-effects.  Please do not provide this number to others for use. For return to work notes please contact your primary care provider.   If someone you know is interested in receiving treatment please have them call the COVID hotline at [phone].    

## 2020-12-09 NOTE — Progress Notes (Signed)
Patient reviewed Fact Sheet for Patients, Parents, and Caregivers for Emergency Use Authorization (EUA) of sotrovimab for the Treatment of Coronavirus. Patient also reviewed and is agreeable to the estimated cost of treatment. Patient is agreeable to proceed.   

## 2020-12-13 IMAGING — US US PELVIS COMPLETE WITH TRANSVAGINAL
1 series · 14 of 25 positions shown · non-contrast
Comparison: 12/24/2013

CLINICAL DATA: RIGHT lower quadrant and pelvic pain; LMP 02/08/2020

EXAM:
TRANSABDOMINAL AND TRANSVAGINAL ULTRASOUND OF PELVIS
TECHNIQUE: Both transabdominal and transvaginal ultrasound examinations of the
pelvis were performed. Transabdominal technique was performed for
global imaging of the pelvis including uterus, ovaries, adnexal
regions, and pelvic cul-de-sac. It was necessary to proceed with
endovaginal exam following the transabdominal exam to visualize the
uterus and ovaries. Transabdominal imaging limited secondary to body
habitus.

[Series 1: us pelvis complete with transvaginal · 26 acquisitions, 14 frames shown]
[im 1/26]
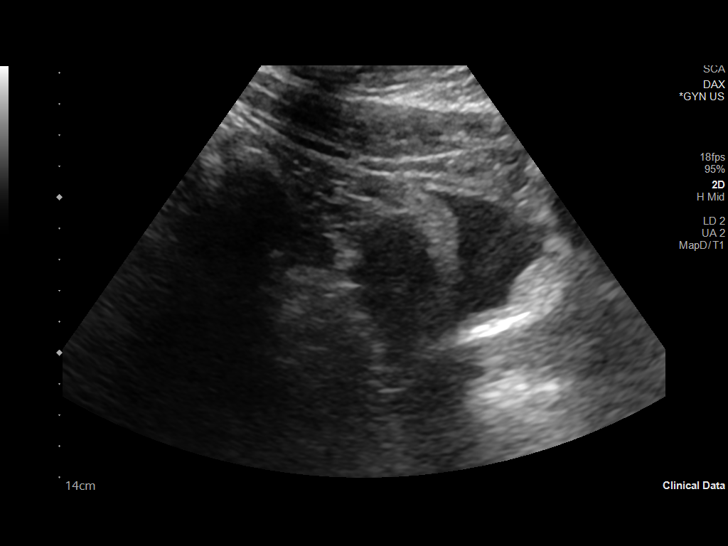
[im 3/26]
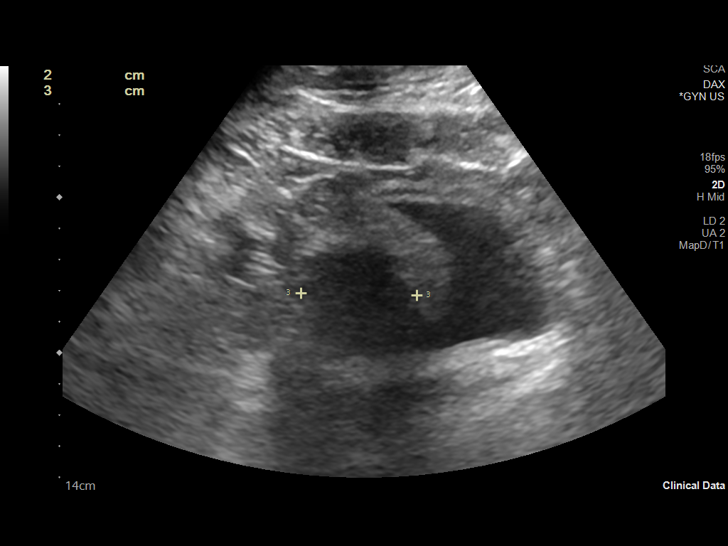
[im 5/26]
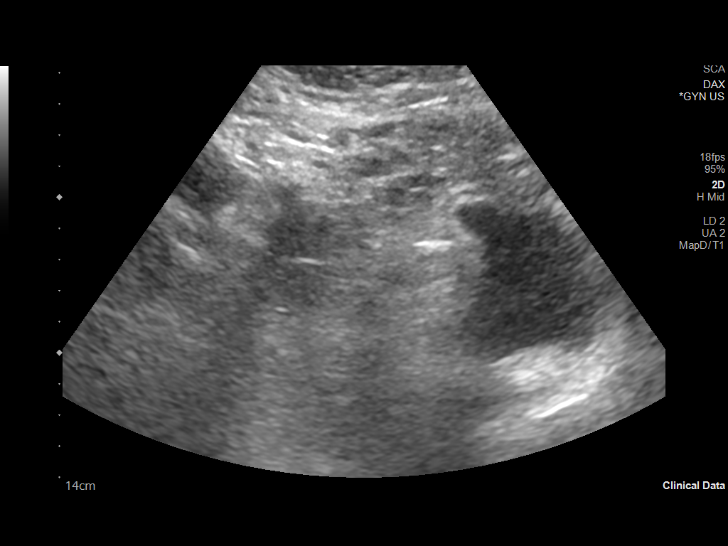
[im 7/26]
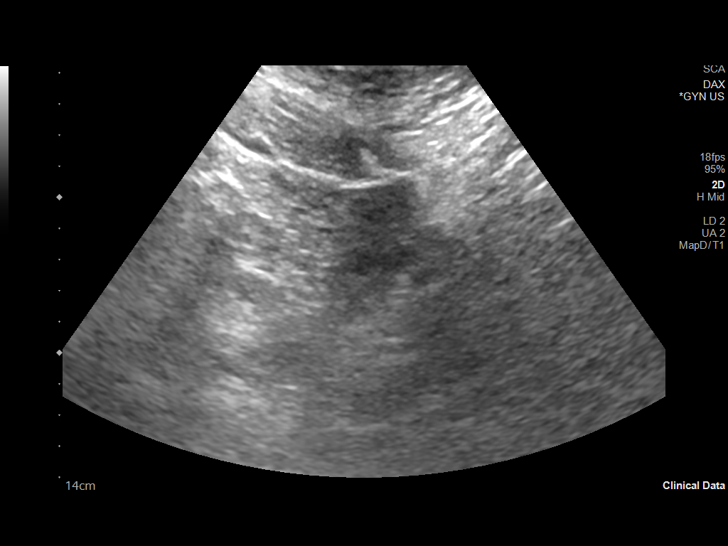
[im 9/26]
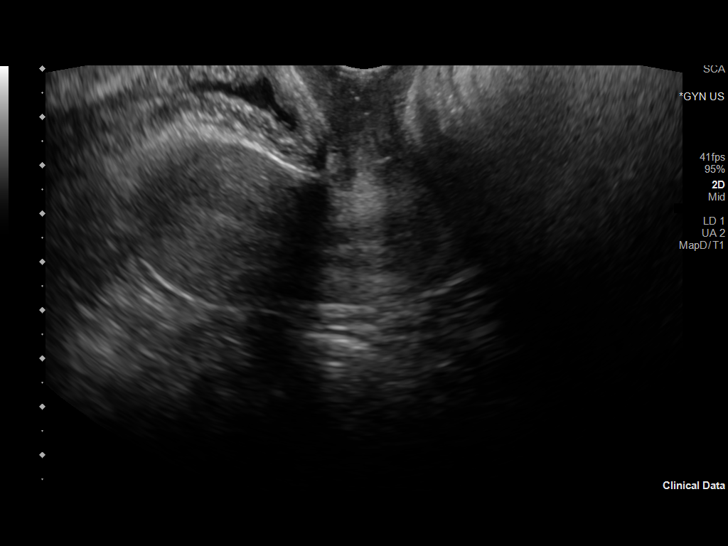
[im 10/26]
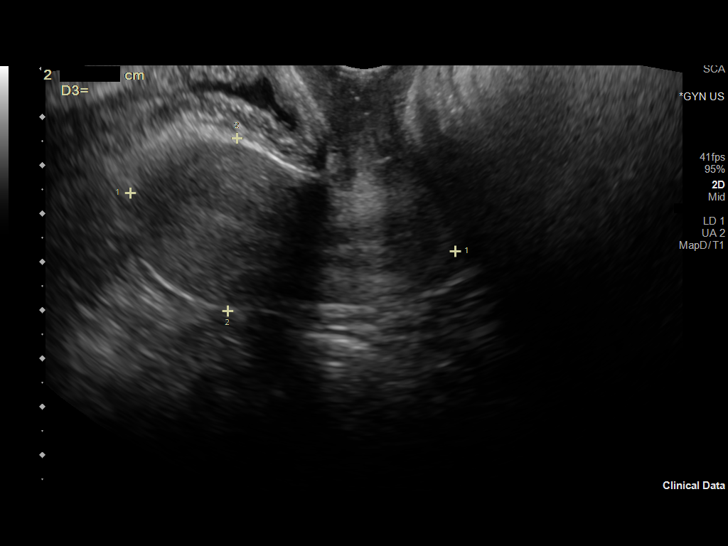
[im 12/26]
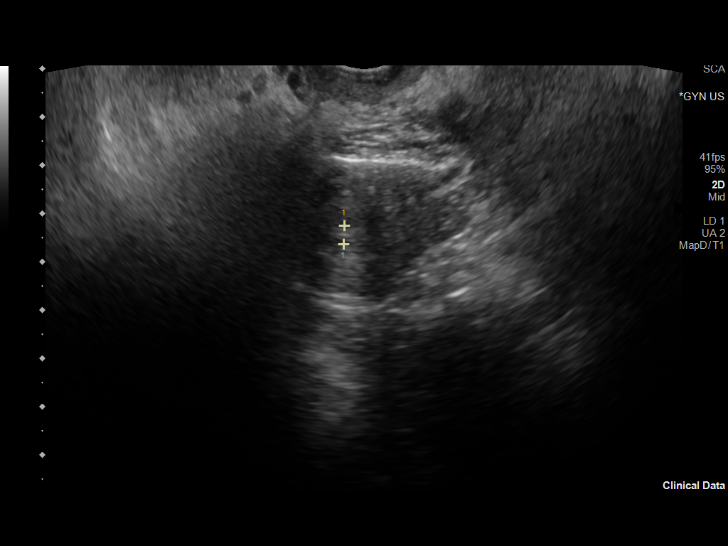
[im 14/26]
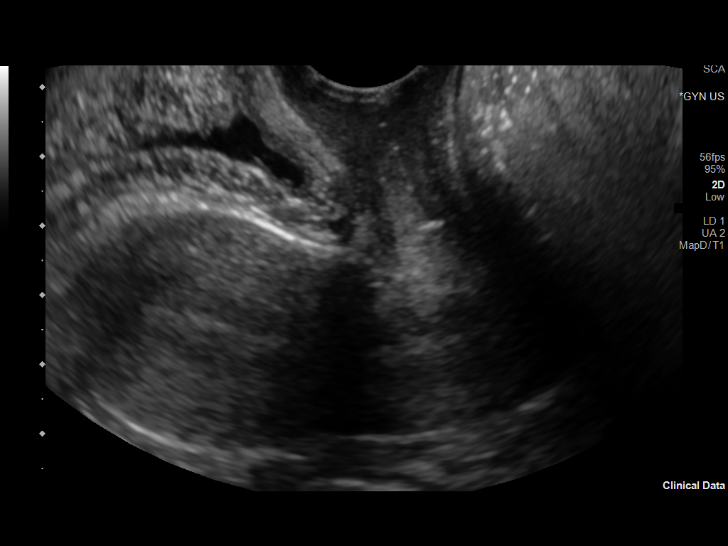
[im 16/26]
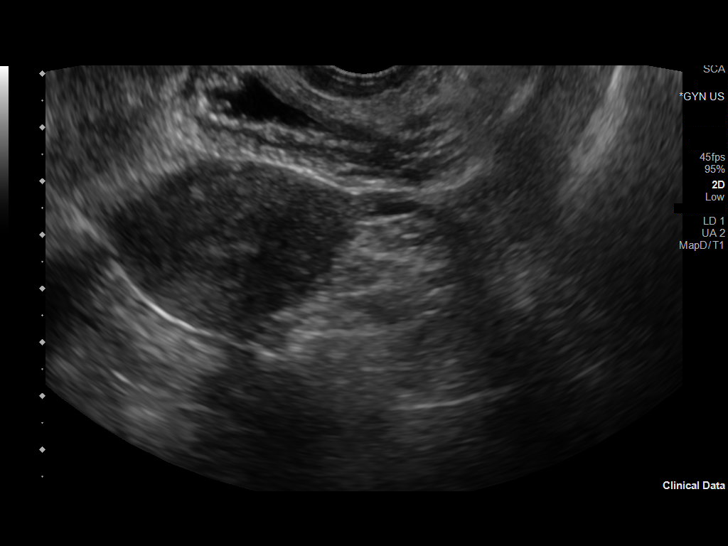
[im 17/26]
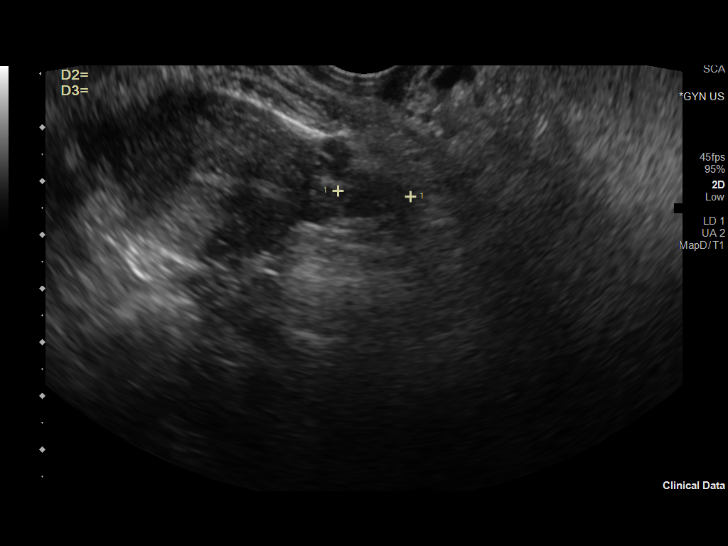
[im 19/26]
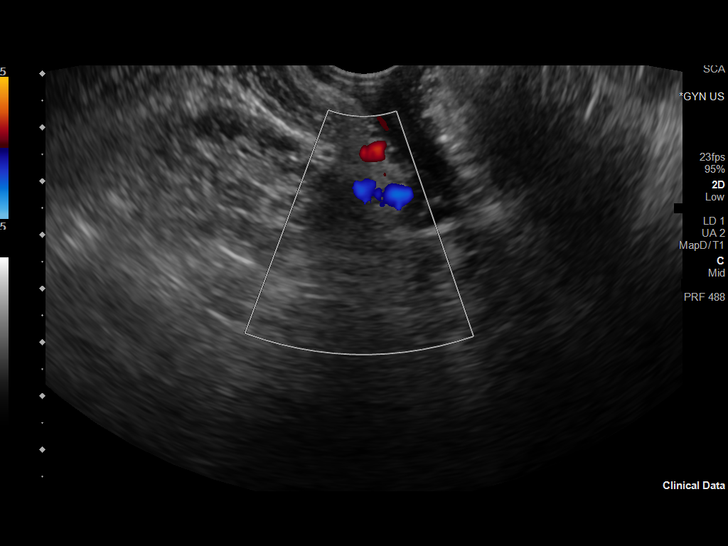
[im 21/26]
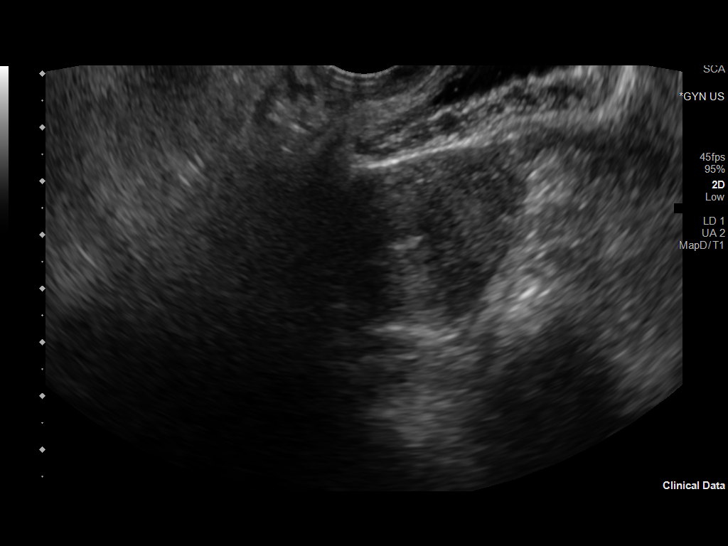
[im 23/26]
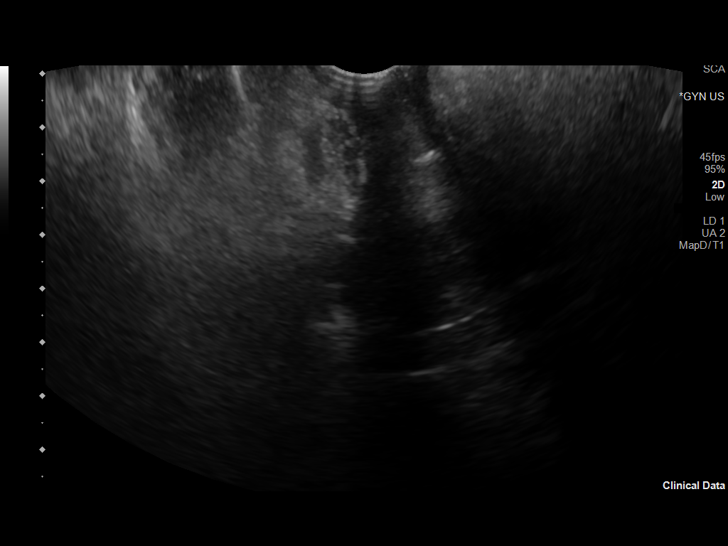
[im 26/26]
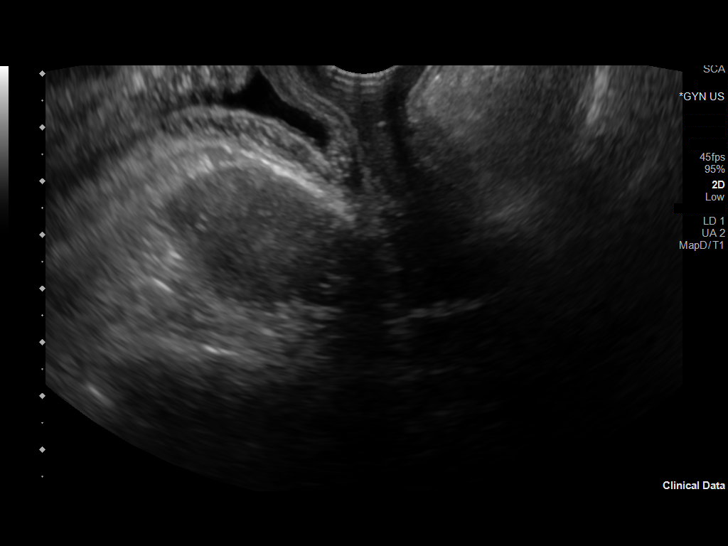

[14 of 25 positions shown; findings below may reference images not displayed]

FINDINGS: Uterus

Measurements: 6.8 x 3.6 x 3.5 cm = volume: 45 mL. Anteverted. Normal
morphology without mass

Endometrium

Thickness: 4 mm.  No endometrial fluid or focal abnormality

Right ovary

Not visualized, likely obscured by bowel

Left ovary

Measurements: 1.3 x 1.4 x 1.0 cm = volume: 0.9 mL. Questionably
visualized. No gross abnormality.

Other findings

No free pelvic fluid.  No definite pelvic masses.
IMPRESSION: Nonvisualization of RIGHT ovary and questionable visualization of a
normal appearing but small LEFT ovary.

Otherwise negative exam.

## 2021-02-16 ENCOUNTER — Emergency Department (HOSPITAL_BASED_OUTPATIENT_CLINIC_OR_DEPARTMENT_OTHER)
Admission: EM | Admit: 2021-02-16 | Discharge: 2021-02-16 | Disposition: A | Discharge status: Home / Self Care | Payer: Self-pay | Attending: Emergency Medicine | Admitting: Emergency Medicine

## 2021-02-16 ENCOUNTER — Encounter (HOSPITAL_BASED_OUTPATIENT_CLINIC_OR_DEPARTMENT_OTHER): Payer: Self-pay

## 2021-02-16 ENCOUNTER — Other Ambulatory Visit: Payer: Self-pay

## 2021-02-16 DIAGNOSIS — J45901 Unspecified asthma with (acute) exacerbation: Secondary | ICD-10-CM

## 2021-02-16 DIAGNOSIS — J4521 Mild intermittent asthma with (acute) exacerbation: Secondary | ICD-10-CM | POA: Insufficient documentation

## 2021-02-16 DIAGNOSIS — F17210 Nicotine dependence, cigarettes, uncomplicated: Secondary | ICD-10-CM | POA: Insufficient documentation

## 2021-02-16 MED ORDER — PREDNISONE 20 MG PO TABS: 60.0000 mg | ORAL_TABLET | Freq: Every day | ORAL | 0 refills | Status: AC

## 2021-02-16 MED ORDER — ALBUTEROL SULFATE HFA 108 (90 BASE) MCG/ACT IN AERS
6.0000 | INHALATION_SPRAY | Freq: Once | RESPIRATORY_TRACT | Status: AC
  Administered 2021-02-16: 6 via RESPIRATORY_TRACT
  Filled 2021-02-16: qty 6.7

## 2021-02-16 MED ORDER — ALBUTEROL SULFATE (2.5 MG/3ML) 0.083% IN NEBU: 2.5000 mg | INHALATION_SOLUTION | RESPIRATORY_TRACT | 0 refills | Status: DC | PRN

## 2021-02-16 MED ORDER — PREDNISONE 50 MG PO TABS
60.0000 mg | ORAL_TABLET | Freq: Once | ORAL | Status: AC
  Administered 2021-02-16: 60 mg via ORAL
  Filled 2021-02-16: qty 1

## 2021-02-16 NOTE — Discharge Instructions (Addendum)
Continue inhaler as needed throughout the day.  Nebulizer solution has been sent to your pharmacy.  Take prednisoneOver the next several days.  Next dose should be tomorrow morning.  Please return if symptoms worsen.

## 2021-02-16 NOTE — ED Triage Notes (Signed)
SOB/audible wheezing x 1 days.  Has been out of meds since sxs started.  Denies CP or any recent illness/injury.

## 2021-02-16 NOTE — ED Provider Notes (Signed)
[facility] EMERGENCY DEPT Provider Note   CSN: [id number] Arrival date & time: 02/16/21  1317     History Chief Complaint  Patient presents with   Shortness of Breath    Natalie Ramsey is a 30 y.o. female.  The history is provided by the patient.  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 No pertinent surgical history.   OB History    Gravida  0   Para  0   Term  0   Preterm  0   AB  0   Living  0     SAB  0   IAB  0   Ectopic  0   Multiple  0   Live Births  0           History reviewed. No pertinent family history.  Social History   Tobacco Use   Smoking status: Current Some Day Smoker    Packs/day: 0.50    Types: Cigarettes   Smokeless tobacco: Never Used  Vaping Use   Vaping Use: Never used  Substance Use Topics   Alcohol use: Not Currently    Comment: occ   Drug use: No    Home Medications Prior to Admission medications   Medication Sig Start Date End Date Taking? Authorizing Provider  acetaminophen (TYLENOL) 325 MG tablet Take 2 tablets (650 mg total) by mouth every 6 (six) hours as needed for mild pain, moderate pain, fever or headache. 12/06/20  Yes Renne Crigler, PA-C  predniSONE  (DELTASONE) 20 MG tablet Take 3 tablets (60 mg total) by mouth daily for 4 days. 02/16/21 02/20/21 Yes Tilden Broz, DO  albuterol (PROVENTIL) (2.5 MG/3ML) 0.083% nebulizer solution Take 3 mLs (2.5 mg total) by nebulization every 4 (four) hours as needed for wheezing or shortness of breath. 02/16/21   Vielka Klinedinst, DO  doxycycline (VIBRAMYCIN) 100 MG capsule Take 1 capsule (100 mg total) by mouth 2 (two) times daily. 05/18/20   Horton, Mayer Masker, MD  ibuprofen (ADVIL) 600 MG tablet Take 1 tablet (600 mg total) by mouth every 6 (six) hours as needed. 12/06/20   Renne Crigler, PA-C  naproxen (NAPROSYN) 500 MG tablet Take 1 tablet (500 mg total) by mouth 2 (two) times daily. 05/18/20   Horton, Mayer Masker, MD  ondansetron (ZOFRAN ODT) 4 MG disintegrating tablet Take 1 tablet (4 mg total) by mouth every 8 (eight) hours as needed for nausea or vomiting. 12/06/20   Renne Crigler, PA-C  Norethindrone Acetate-Ethinyl Estrad-FE (LOESTRIN 24 FE) 1-20 MG-MCG(24) tablet Take 1 tablet by mouth daily. Patient not taking: Reported on 03/17/2020 12/13/19 12/06/20  Willodean Rosenthal, MD    Allergies    Ceftriaxone, Levaquin [levofloxacin in d5w], and Zosyn [piperacillin sod-tazobactam  so]  Review of Systems   Review of Systems  Constitutional: Negative for chills and fever.  HENT: Negative for ear pain and sore throat.   Eyes: Negative for pain and visual disturbance.  Respiratory: Positive for shortness of breath and wheezing. Negative for cough.   Cardiovascular: Negative for chest pain, palpitations and claudication.  Gastrointestinal: Negative for abdominal pain and vomiting.  Genitourinary: Negative for dysuria and hematuria.  Musculoskeletal: Negative for arthralgias and back pain.  Skin: Negative for color change and rash.  Neurological: Negative for seizures and syncope.  All other systems reviewed and are negative.   Physical Exam Updated Vital Signs BP (!) 145/95 (BP Location: Right Arm)     Pulse 91    Temp 99.4 F (37.4 C) (Oral)    Resp (!) 28    Ht 5\' 11"  (1.803 m)    Wt (!) 202.8 kg    SpO2 100%    BMI 62.34 kg/m   Physical Exam Vitals and nursing note reviewed.  Constitutional:      General: She is not in acute distress.    Appearance: She is well-developed. She is not ill-appearing.  HENT:     Head: Normocephalic and atraumatic.  Eyes:     Conjunctiva/sclera: Conjunctivae normal.     Pupils: Pupils are equal, round, and reactive to light.  Cardiovascular:     Rate and Rhythm: Normal rate and regular rhythm.     Pulses: Normal pulses.     Heart sounds: Normal heart sounds. No murmur heard.   Pulmonary:     Effort: Pulmonary effort is normal. No tachypnea or respiratory distress.     Breath sounds: Wheezing present.  Abdominal:     Palpations: Abdomen is soft.     Tenderness: There is no abdominal tenderness.  Musculoskeletal:     Cervical back: Normal range of motion and neck supple.  Skin:    General: Skin is warm and dry.  Neurological:     Mental Status: She is alert.     ED Results / Procedures / Treatments   Labs (all labs ordered are listed, but only abnormal results are displayed) Labs Reviewed - No data to display  EKG None  Radiology No results found.  Procedures Procedures   Medications Ordered in ED Medications  albuterol (VENTOLIN HFA) 108 (90 Base) MCG/ACT inhaler 6 puff (has no administration in time range)  predniSONE (DELTASONE) tablet 60 mg (has no administration in time range)    ED Course  I have reviewed the triage vital signs and the nursing notes.  Pertinent labs & imaging results that were available during my care of the patient were reviewed by me and considered in my medical decision making (see chart for details).    MDM Rules/Calculators/A&P                          Natalie Ramsey is here with active exacerbation.  Ran out of inhaler.  Normal vitals.  No fever.  Normal work of breathing.  Scant expiratory  wheeze.  Overall well-appearing.  No concern for ACS or PE.  Overall suspect mild asthma exacerbation.  Felt better after albuterol.  Given prescription for prednisone and nebulizer solution.  Given return precautions and discharged in ED in good condition.  No concern for infectious process.    This chart was dictated using voice recognition software.  Despite best efforts to proofread,  errors can occur which can change the  documentation meaning.   Final Clinical Impression(s) / ED Diagnoses Final diagnoses:  Mild asthma with exacerbation, unspecified whether persistent    Rx / DC Orders ED Discharge Orders         Ordered    albuterol (PROVENTIL) (2.5 MG/3ML) 0.083% nebulizer solution  Every 4 hours PRN        02/16/21 1334    predniSONE (DELTASONE) 20 MG tablet  Daily        02/16/21 1334           Virgina Norfolk, DO 02/16/21 1336

## 2021-04-15 ENCOUNTER — Emergency Department (HOSPITAL_BASED_OUTPATIENT_CLINIC_OR_DEPARTMENT_OTHER): Payer: Self-pay

## 2021-04-15 ENCOUNTER — Other Ambulatory Visit: Payer: Self-pay

## 2021-04-15 ENCOUNTER — Encounter (HOSPITAL_BASED_OUTPATIENT_CLINIC_OR_DEPARTMENT_OTHER): Payer: Self-pay | Admitting: Emergency Medicine

## 2021-04-15 ENCOUNTER — Observation Stay (HOSPITAL_BASED_OUTPATIENT_CLINIC_OR_DEPARTMENT_OTHER)
Admission: EM | Admit: 2021-04-15 | Discharge: 2021-04-16 | Disposition: A | Discharge status: Home / Self Care | Payer: Self-pay | Attending: Family Medicine | Admitting: Family Medicine

## 2021-04-15 DIAGNOSIS — F17210 Nicotine dependence, cigarettes, uncomplicated: Secondary | ICD-10-CM | POA: Insufficient documentation

## 2021-04-15 DIAGNOSIS — Z20822 Contact with and (suspected) exposure to covid-19: Secondary | ICD-10-CM | POA: Insufficient documentation

## 2021-04-15 DIAGNOSIS — E662 Morbid (severe) obesity with alveolar hypoventilation: Secondary | ICD-10-CM | POA: Insufficient documentation

## 2021-04-15 DIAGNOSIS — J4541 Moderate persistent asthma with (acute) exacerbation: Principal | ICD-10-CM | POA: Insufficient documentation

## 2021-04-15 DIAGNOSIS — J45901 Unspecified asthma with (acute) exacerbation: Secondary | ICD-10-CM

## 2021-04-15 DIAGNOSIS — J45909 Unspecified asthma, uncomplicated: Secondary | ICD-10-CM | POA: Diagnosis present

## 2021-04-15 DIAGNOSIS — R030 Elevated blood-pressure reading, without diagnosis of hypertension: Secondary | ICD-10-CM | POA: Insufficient documentation

## 2021-04-15 LAB — CBC WITH DIFFERENTIAL/PLATELET
Abs Immature Granulocytes: 0.02 10*3/uL (ref 0.00–0.07)
Basophils Absolute: 0 10*3/uL (ref 0.0–0.1)
Basophils Relative: 0 %
Eosinophils Absolute: 0.2 10*3/uL (ref 0.0–0.5)
Eosinophils Relative: 2 %
HCT: 40.4 % (ref 36.0–46.0)
Hemoglobin: 12.5 g/dL (ref 12.0–15.0)
Immature Granulocytes: 0 %
Lymphocytes Relative: 11 %
Lymphs Abs: 0.9 10*3/uL (ref 0.7–4.0)
MCH: 27.4 pg (ref 26.0–34.0)
MCHC: 30.9 g/dL (ref 30.0–36.0)
MCV: 88.6 fL (ref 80.0–100.0)
Monocytes Absolute: 0.6 10*3/uL (ref 0.1–1.0)
Monocytes Relative: 7 %
Neutro Abs: 6 10*3/uL (ref 1.7–7.7)
Neutrophils Relative %: 80 %
Platelets: 242 10*3/uL (ref 150–400)
RBC: 4.56 MIL/uL (ref 3.87–5.11)
RDW: 14.3 % (ref 11.5–15.5)
WBC: 7.7 10*3/uL (ref 4.0–10.5)
nRBC: 0 % (ref 0.0–0.2)

## 2021-04-15 LAB — BRAIN NATRIURETIC PEPTIDE: B Natriuretic Peptide: 19.8 pg/mL (ref 0.0–100.0)

## 2021-04-15 LAB — BASIC METABOLIC PANEL
Anion gap: 9 (ref 5–15)
BUN: 7 mg/dL (ref 6–20)
CO2: 26 mmol/L (ref 22–32)
Calcium: 8.6 mg/dL — ABNORMAL LOW (ref 8.9–10.3)
Chloride: 100 mmol/L (ref 98–111)
Creatinine, Ser: 0.88 mg/dL (ref 0.44–1.00)
GFR, Estimated: >60 mL/min (ref >60)
Glucose, Bld: 108 mg/dL — ABNORMAL HIGH (ref 70–99)
Potassium: 4.2 mmol/L (ref 3.5–5.1)
Sodium: 135 mmol/L (ref 135–145)

## 2021-04-15 LAB — RESP PANEL BY RT-PCR (FLU A&B, COVID) ARPGX2
Influenza A by PCR: NEGATIVE
Influenza B by PCR: NEGATIVE
SARS Coronavirus 2 by RT PCR: NEGATIVE

## 2021-04-15 LAB — HCG, SERUM, QUALITATIVE: Preg, Serum: NEGATIVE

## 2021-04-15 LAB — HEMOGLOBIN A1C
Hgb A1c MFr Bld: 5.9 % — ABNORMAL HIGH (ref 4.8–5.6)
Mean Plasma Glucose: 122.63 mg/dL

## 2021-04-15 MED ORDER — IPRATROPIUM-ALBUTEROL 0.5-2.5 (3) MG/3ML IN SOLN
3.0000 mL | RESPIRATORY_TRACT | Status: DC
  Administered 2021-04-15 – 2021-04-16 (×5): 3 mL via RESPIRATORY_TRACT
  Filled 2021-04-15 (×4): qty 3

## 2021-04-15 MED ORDER — MAGNESIUM SULFATE 2 GM/50ML IV SOLN
2.0000 g | Freq: Once | INTRAVENOUS | Status: AC
  Administered 2021-04-15: 2 g via INTRAVENOUS
  Filled 2021-04-15: qty 50

## 2021-04-15 MED ORDER — ENOXAPARIN SODIUM 100 MG/ML IJ SOSY
100.0000 mg | PREFILLED_SYRINGE | INTRAMUSCULAR | Status: DC
  Administered 2021-04-15: 100 mg via SUBCUTANEOUS
  Filled 2021-04-15: qty 1

## 2021-04-15 MED ORDER — IPRATROPIUM BROMIDE 0.02 % IN SOLN
1.0000 mg | Freq: Once | RESPIRATORY_TRACT | Status: AC
  Administered 2021-04-15: 1 mg via RESPIRATORY_TRACT
  Filled 2021-04-15: qty 5

## 2021-04-15 MED ORDER — MAGNESIUM SULFATE 50 % IJ SOLN
2.0000 g | Freq: Once | INTRAMUSCULAR | Status: DC
  Filled 2021-04-15: qty 4

## 2021-04-15 MED ORDER — ALBUTEROL (5 MG/ML) CONTINUOUS INHALATION SOLN
10.0000 mg/h | INHALATION_SOLUTION | Freq: Once | RESPIRATORY_TRACT | Status: AC
  Administered 2021-04-15: 10 mg/h via RESPIRATORY_TRACT
  Filled 2021-04-15: qty 20

## 2021-04-15 MED ORDER — IPRATROPIUM-ALBUTEROL 0.5-2.5 (3) MG/3ML IN SOLN
3.0000 mL | RESPIRATORY_TRACT | Status: DC | PRN
  Administered 2021-04-15 (×2): 3 mL via RESPIRATORY_TRACT
  Filled 2021-04-15 (×2): qty 3

## 2021-04-15 MED ORDER — ACETAMINOPHEN 650 MG RE SUPP: 650.0000 mg | Freq: Four times a day (QID) | RECTAL | Status: DC | PRN

## 2021-04-15 MED ORDER — IPRATROPIUM-ALBUTEROL 0.5-2.5 (3) MG/3ML IN SOLN
3.0000 mL | Freq: Four times a day (QID) | RESPIRATORY_TRACT | Status: DC
  Administered 2021-04-15: 3 mL via RESPIRATORY_TRACT
  Filled 2021-04-15: qty 3

## 2021-04-15 MED ORDER — ACETAMINOPHEN 325 MG PO TABS: 650.0000 mg | ORAL_TABLET | Freq: Four times a day (QID) | ORAL | Status: DC | PRN

## 2021-04-15 MED ORDER — ACETAMINOPHEN 500 MG PO TABS
1000.0000 mg | ORAL_TABLET | Freq: Once | ORAL | Status: AC
  Administered 2021-04-15: 1000 mg via ORAL
  Filled 2021-04-15: qty 2

## 2021-04-15 MED ORDER — NICOTINE 7 MG/24HR TD PT24
7.0000 mg | MEDICATED_PATCH | Freq: Every day | TRANSDERMAL | Status: DC
  Administered 2021-04-15 – 2021-04-16 (×2): 7 mg via TRANSDERMAL
  Filled 2021-04-15 (×2): qty 1

## 2021-04-15 MED ORDER — ALBUTEROL SULFATE (2.5 MG/3ML) 0.083% IN NEBU: 2.5000 mg | INHALATION_SOLUTION | RESPIRATORY_TRACT | Status: DC | PRN

## 2021-04-15 MED ORDER — ONDANSETRON HCL 4 MG PO TABS: 4.0000 mg | ORAL_TABLET | Freq: Four times a day (QID) | ORAL | Status: DC | PRN

## 2021-04-15 MED ORDER — ONDANSETRON HCL 4 MG/2ML IJ SOLN: 4.0000 mg | Freq: Four times a day (QID) | INTRAMUSCULAR | Status: DC | PRN

## 2021-04-15 MED ORDER — NICOTINE POLACRILEX 2 MG MT GUM
2.0000 mg | CHEWING_GUM | OROMUCOSAL | Status: DC | PRN
  Filled 2021-04-15: qty 1

## 2021-04-15 MED ORDER — METHYLPREDNISOLONE SODIUM SUCC 125 MG IJ SOLR
125.0000 mg | Freq: Once | INTRAMUSCULAR | Status: AC
  Administered 2021-04-15: 125 mg via INTRAVENOUS
  Filled 2021-04-15: qty 2

## 2021-04-15 MED ORDER — METHYLPREDNISOLONE SODIUM SUCC 125 MG IJ SOLR
60.0000 mg | Freq: Three times a day (TID) | INTRAMUSCULAR | Status: DC
  Administered 2021-04-15 – 2021-04-16 (×3): 60 mg via INTRAVENOUS
  Filled 2021-04-15 (×3): qty 2

## 2021-04-15 NOTE — ED Notes (Signed)
Pt stated that she was feeling better than when she came to the hospital

## 2021-04-15 NOTE — Plan of Care (Signed)
  Problem: Education: Goal: Knowledge of General Education information will improve Description Including pain rating scale, medication(s)/side effects and non-pharmacologic comfort measures Outcome: Progressing   

## 2021-04-15 NOTE — ED Notes (Signed)
Pt. being admitted for Asthma Exacerbation, Duoneb given per Q2PRN order to bridge until [facility] arrival/transport/admission.

## 2021-04-15 NOTE — ED Notes (Signed)
Report called to [facility] 

## 2021-04-15 NOTE — ED Provider Notes (Signed)
[facility] EMERGENCY DEPT Provider Note   CSN: [id number] Arrival date & time: 04/15/21  0840     History Chief Complaint  Patient presents with  . Shortness of Breath    Natalie Ramsey is a 30 y.o. female.  HPI Patient reports symptom started yesterday with some mild nasal congestion and drainage in the back of her throat.  She assumed she was having some allergies.  She tried her allergy pill without relief.  She reports symptoms progressed to cough and some chills.  Patient has history of asthma.  She started to feel short of breath and has tried her inhaler and her nebulizer treatment but has increased shortness of breath this morning.  She reports in the early hours of the morning she did vomit and have some diarrhea.  She reports the cough has been slightly productive of some greenish material.  No leg swelling or calf pain.  Patient reports she smokes a small amount of cigarettes.  She denies other drug or alcohol use    Past Medical History:  Diagnosis Date  . Asthma   . Asthma   . Pyelonephritis     Patient Active Problem List   Diagnosis Date Noted  . Asthma exacerbation 04/15/2021  . 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 Healthcare maintenance 01/02/2014  . Fever, unspecified 12/27/2013  . Hypokalemia 12/25/2013  . 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 Sepsis (HCC) 12/24/2013  . Pyelonephritis 12/24/2013  . Asthma, chronic 12/24/2013    History reviewed. No pertinent surgical history.   OB History    Gravida  0   Para  0   Term  0   Preterm  0   AB  0   Living  0     SAB  0   IAB  0   Ectopic  0   Multiple  0   Live Births  0           No family history on file.  Social History   Tobacco Use  . Smoking status: Current Some Day Smoker    Packs/day: 0.50    Types: Cigarettes  . Smokeless tobacco: Never Used  Vaping Use  . Vaping Use: Never used  Substance Use Topics  . Alcohol use: Not Currently     Comment: occ  . Drug use: No    Home Medications Prior to Admission medications   Medication Sig Start Date End Date Taking? Authorizing Provider  acetaminophen (TYLENOL) 325 MG tablet Take 2 tablets (650 mg total) by mouth every 6 (six) hours as needed for mild pain, moderate pain, fever or headache. 12/06/20   Renne Crigler, PA-C  albuterol (PROVENTIL) (2.5 MG/3ML) 0.083% nebulizer solution Take 3 mLs (2.5 mg total) by nebulization every 4 (four) hours as needed for wheezing or shortness of breath. 02/16/21   Curatolo, Adam, DO  doxycycline (VIBRAMYCIN) 100 MG capsule Take 1 capsule (100 mg total) by mouth 2 (two) times daily. 05/18/20   Horton, Mayer Masker, MD  ibuprofen (ADVIL) 600 MG tablet Take 1 tablet (600 mg total) by mouth every 6 (six) hours as needed. 12/06/20   Renne Crigler, PA-C  naproxen (NAPROSYN) 500 MG tablet Take 1 tablet (500 mg total) by mouth 2 (two) times daily. 05/18/20   Horton, Mayer Masker, MD  ondansetron (ZOFRAN ODT) 4 MG disintegrating tablet Take 1 tablet (4 mg total) by mouth every 8 (eight) hours as needed for nausea or vomiting. 12/06/20   Renne Crigler, PA-C  Norethindrone Acetate-Ethinyl  Estrad-FE (LOESTRIN 24 FE) 1-20 MG-MCG(24) tablet Take 1 tablet by mouth daily. Patient not taking: Reported on 03/17/2020 12/13/19 12/06/20  Willodean Rosenthal, MD    Allergies    Ceftriaxone, Levaquin [levofloxacin in d5w], and Zosyn [piperacillin sod-tazobactam so]  Review of Systems   Review of Systems 10 systems reviewed and negative except as per HPI Physical Exam Updated Vital Signs BP 123/63   Pulse 82   Temp (!) 100.5 F (38.1 C) (Oral)   Resp (!) 25   Ht 5\' 11"  (1.803 m)   Wt (!) 192.9 kg   LMP 04/01/2021 (Approximate)   SpO2 92%   BMI 59.31 kg/m   Physical Exam Constitutional:      Comments: Patient is alert.  Mild/moderate increased work of breathing at rest.  Speaking in full short sentences.  Nontoxic.  HENT:     Head: Normocephalic and atraumatic.      Nose: Nose normal.     Mouth/Throat:     Mouth: Mucous membranes are moist.     Pharynx: Oropharynx is clear.  Eyes:     Extraocular Movements: Extraocular movements intact.     Conjunctiva/sclera: Conjunctivae normal.  Cardiovascular:     Rate and Rhythm: Normal rate and regular rhythm.     Heart sounds: Normal heart sounds.  Pulmonary:     Comments: Speaking in full sentences.  Mild to moderate increased work of breathing.  Wheezing throughout upper and mid lung fields.  Lower lung fields decreased flow Abdominal:     General: There is no distension.     Palpations: Abdomen is soft.     Tenderness: There is no abdominal tenderness. There is no guarding.  Musculoskeletal:     Cervical back: Neck supple.     Comments: No swelling of the lower legs calves or ankles.  Calves are soft and nontender peer  Skin:    General: Skin is warm and dry.  Neurological:     General: No focal deficit present.     Mental Status: She is oriented to person, place, and time.     Motor: No weakness.     Coordination: Coordination normal.  Psychiatric:        Mood and Affect: Mood normal.     ED Results / Procedures / Treatments   Labs (all labs ordered are listed, but only abnormal results are displayed) Labs Reviewed  BASIC METABOLIC PANEL - Abnormal; Notable for the following components:      Result Value   Glucose, Bld 108 (*)    Calcium 8.6 (*)    All other components within normal limits  RESP PANEL BY RT-PCR (FLU A&B, COVID) ARPGX2  CBC WITH DIFFERENTIAL/PLATELET  HCG, SERUM, QUALITATIVE  URINALYSIS, ROUTINE W REFLEX MICROSCOPIC    EKG EKG Interpretation  Date/Time:  Thursday Apr 15 2021 08:52:11 EDT Ventricular Rate:  83 PR Interval:  166 QRS Duration: 83 QT Interval:  355 QTC Calculation: 418 R Axis:   68 Text Interpretation: Sinus rhythm no change from previous Confirmed by [id number] [phone]) on 04/15/2021 10:09:53 AM   Radiology DG Chest Port 1 View  Result  Date: 04/15/2021 CLINICAL DATA:  Cough and fever.  Shortness of breath. EXAM: PORTABLE CHEST 1 VIEW COMPARISON:  03/17/2020 FINDINGS: 1011 hours. Low lung volumes. Heart size upper normal. The lungs are clear without focal pneumonia, edema, pneumothorax or pleural effusion. The visualized bony structures of the thorax show no acute abnormality. Telemetry leads overlie the chest. IMPRESSION: Low volume film without  acute cardiopulmonary findings. Electronically Signed   By: Kennith Center M.D.   On: 04/15/2021 10:39    Procedures Procedures  CRITICAL CARE Performed by: Arby Barrette   Total critical care time: 30 minutes  Critical care time was exclusive of separately billable procedures and treating other patients.  Critical care was necessary to treat or prevent imminent or life-threatening deterioration.  Critical care was time spent personally by me on the following activities: development of treatment plan with patient and/or surrogate as well as nursing, discussions with consultants, evaluation of patient's response to treatment, examination of patient, obtaining history from patient or surrogate, ordering and performing treatments and interventions, ordering and review of laboratory studies, ordering and review of radiographic studies, pulse oximetry and re-evaluation of patient's condition. Medications Ordered in ED Medications  methylPREDNISolone sodium succinate (SOLU-MEDROL) 125 mg/2 mL injection 125 mg (125 mg Intravenous Given 04/15/21 1234)  acetaminophen (TYLENOL) tablet 1,000 mg (1,000 mg Oral Given 04/15/21 1028)  albuterol (PROVENTIL,VENTOLIN) solution continuous neb (10 mg/hr Nebulization Given 04/15/21 1130)  ipratropium (ATROVENT) nebulizer solution 1 mg (1 mg Nebulization Given 04/15/21 1130)  magnesium sulfate IVPB 2 g 50 mL (0 g Intravenous Stopped 04/15/21 1214)    ED Course  I have reviewed the triage vital signs and the nursing notes.  Pertinent labs & imaging results  that were available during my care of the patient were reviewed by me and considered in my medical decision making (see chart for details).  Clinical Course as of 04/15/21 1443  Thu Apr 15, 2021  1135 Recheck: Patient has come back negative for COVID and influenza testing.  Hour-long DuoNeb is being started right now. [MP]  1350 Recheck: Patient has completed her hour-long nebulizer treatment.  I entered the room with the patient asleep.  Sleeping oxygen saturation is at 89%.  When awake, saturation improved to 96% without supplemental oxygen.  Patient does subjectively feel somewhat improved but still short of breath.  She continues have extensive wheezing.  Patient will need admission for ongoing treatment of asthma exacerbation with obesity hypoventilation [MP]    Clinical Course User Index [MP] Arby Barrette, MD   MDM Rules/Calculators/A&P                          Patient presents as outlined.  COVID and influenza testing is negative.  No focal areas of pneumonia.  At this time findings most consistent with asthma exacerbation.  After hour-long nebulizer, patient still has wheezing and mild to moderate increased work of breathing.  Oxygen saturation is stable at the high 90s while awake.  Patient does exhibit hypoventilation when sleeping.  This corrects while awake.  Patient's mental status is clear.  Will admit for ongoing treatment of persistent asthma.  Patient does have low-grade fever.  Consideration given to viral illness.  We will also add urinalysis.  Patient does not have leukocytosis or other signs of significant infection.  At this time empiric antibiotics not initiated. Final Clinical Impression(s) / ED Diagnoses Final diagnoses:  Moderate persistent asthma with exacerbation  Obesity hypoventilation syndrome [facility])    Rx / DC Orders ED Discharge Orders    None       Arby Barrette, MD 04/15/21 1444

## 2021-04-15 NOTE — Progress Notes (Signed)
   04/15/21 1658  Assess: MEWS Score  Temp 98.8 F (37.1 C)  BP (!) 150/100  Pulse Rate 78  Resp (!) 26  Level of Consciousness Alert  SpO2 99 %  O2 Device Room Air  Assess: MEWS Score  MEWS Temp 0  MEWS Systolic 0  MEWS Pulse 0  MEWS RR 2  MEWS LOC 0  MEWS Score 2  MEWS Score Color Yellow  Assess: if the MEWS score is Yellow or Red  Were vital signs taken at a resting state? Yes  Focused Assessment Change from prior assessment (see assessment flowsheet)  Does the patient meet 2 or more of the SIRS criteria? No  MEWS guidelines implemented *See Row Information* Yes  Treat  Pain Scale 0-10  Pain Score 0  Take Vital Signs  Increase Vital Sign Frequency  Yellow: Q 2hr X 2 then Q 4hr X 2, if remains yellow, continue Q 4hrs  Escalate  MEWS: Escalate Yellow: discuss with charge nurse/RN and consider discussing with provider and RRT  Notify: Charge Nurse/RN  Name of Charge Nurse/RN Notified Rachel, RN  Date Charge Nurse/RN Notified 04/15/21  Time Charge Nurse/RN Notified 1700  Notify: Provider  Provider Name/Title MD Segal/Powell  Date Provider Notified 04/15/21  Time Provider Notified 1717  Notification Type Page  Notification Reason Other (Comment) (yellow Mews)  Provider response At bedside  Date of Provider Response 04/15/21  Time of Provider Response 1730  Document  Patient Outcome Other (Comment) (MD said to monitor)  Progress note created (see row info) Yes  Assess: SIRS CRITERIA  SIRS Temperature  0  SIRS Pulse 0  SIRS Respirations  1  SIRS WBC 0  SIRS Score Sum  1

## 2021-04-15 NOTE — Plan of Care (Signed)
  Problem: Education: Goal: Knowledge of General Education information will improve Description: Including pain rating scale, medication(s)/side effects and non-pharmacologic comfort measures Outcome: Progressing   Problem: Clinical Measurements: Goal: Respiratory complications will improve Outcome: Progressing   Problem: Activity: Goal: Risk for activity intolerance will decrease Outcome: Progressing   

## 2021-04-15 NOTE — ED Triage Notes (Signed)
Pt via pov from home with sob that started yesterday. Pt states she has hx of asthma and that she has been using her inhaler and neb treatments with some temporary relief. Pt alert & oriented, with audible wheezing during triage.

## 2021-04-15 NOTE — ED Notes (Signed)
1 hr. continuous aerosol nebulizer completed at this time, pt. tolerated well and stated, "breathing little better" when asked,  remains audibly wheezing with notable <'d WOB, RT to monitor.

## 2021-04-15 NOTE — H&P (Addendum)
History and Physical    Natalie Ramsey EQA:[id number] DOB: May 05, 1991 DOA: 04/15/2021  PCP: Patient, No Pcp Per (Inactive)  Patient coming from: home  I have personally briefly reviewed patient's old medical records in [facility]  Chief Complaint: sob  HPI: Natalie Ramsey is Natalie Ramsey 30 y.o. female with medical history significant of asthma, obesity, tobacco abuse who presents with shortness of breath for the past 24 hrs or so.    Symptoms started yesterday with nasal congestion and drainage in back of her throat.  She thought it was allergies, then her "asthma kicked in".  She developed cough and chills.  Had increasing shortness of breath only partially improved with albuterol.  She notes post tussive emesis and diarrhea as well.  Mildly productive cough.  Chest pain with cough.  Fever noted in ED.  No abdominal pain, numbness, tingling, weakness, or swelling.  No hx intubation for asthma.  Last seen in hospital in March for Asthma.  She does not have Natalie Ramsey PCP.  Notes social drinking.  Tobacco use.  No other drugs.     ED Course: steroids, nebs, mag, CXR, covid testing.  Admit to hospitalist for asthma exacerbation.  Review of Systems: As per HPI otherwise all other systems reviewed and are negative.  Past Medical History:  Diagnosis Date  . Asthma   . Asthma   . Pyelonephritis     History reviewed. No pertinent surgical history.  Social History  reports that she has been smoking cigarettes. She has been smoking about 0.50 packs per day. She has never used smokeless tobacco. She reports previous alcohol use. She reports that she does not use drugs.  Allergies  Allergen Reactions  . Ceftriaxone     Patient developed exfoliative dermatitis after taking 3 antibiotics, including Levaquin, ceftriaxone and Zosyn. Not very sure which one caused this problem.  Barbera Setters [Levofloxacin In D5w]     Patient developed exfoliative dermatitis after taking 3 antibiotics, including Levaquin,  ceftriaxone and Zosyn. Not very sure which one caused this problem.  Marland Kitchen Zosyn [Piperacillin Sod-Tazobactam So]     Patient developed exfoliative dermatitis after taking 3 antibiotics, including Levaquin, ceftriaxone and Zosyn. Not very sure which one caused this problem.    No family history on file.  Prior to Admission medications   Medication Sig Start Date End Date Taking? Authorizing Provider  acetaminophen (TYLENOL) 325 MG tablet Take 2 tablets (650 mg total) by mouth every 6 (six) hours as needed for mild pain, moderate pain, fever or headache. 12/06/20   Renne Crigler, PA-C  albuterol (PROVENTIL) (2.5 MG/3ML) 0.083% nebulizer solution Take 3 mLs (2.5 mg total) by nebulization every 4 (four) hours as needed for wheezing or shortness of breath. 02/16/21   Curatolo, Adam, DO  ibuprofen (ADVIL) 600 MG tablet Take 1 tablet (600 mg total) by mouth every 6 (six) hours as needed. 12/06/20   Renne Crigler, PA-C  naproxen (NAPROSYN) 500 MG tablet Take 1 tablet (500 mg total) by mouth 2 (two) times daily. 05/18/20   Horton, Mayer Masker, MD  ondansetron (ZOFRAN ODT) 4 MG disintegrating tablet Take 1 tablet (4 mg total) by mouth every 8 (eight) hours as needed for nausea or vomiting. 12/06/20   Renne Crigler, PA-C  Norethindrone Acetate-Ethinyl Estrad-FE (LOESTRIN 24 FE) 1-20 MG-MCG(24) tablet Take 1 tablet by mouth daily. Patient not taking: Reported on 03/17/2020 12/13/19 12/06/20  Willodean Rosenthal, MD    Physical Exam: Vitals:   04/15/21 1507 04/15/21 1510 04/15/21 1658  04/15/21 1720  BP:   (!) 150/100   Pulse:   78   Resp:   (!) 26   Temp: 98 F (36.7 C)  98.8 F (37.1 C)   TempSrc: Oral     SpO2:  96% 99% 94%  Weight:      Height:        Constitutional: NAD, calm, appears short of breath Vitals:   04/15/21 1507 04/15/21 1510 04/15/21 1658 04/15/21 1720  BP:   (!) 150/100   Pulse:   78   Resp:   (!) 26   Temp: 98 F (36.7 C)  98.8 F (37.1 C)   TempSrc: Oral     SpO2:  96% 99%  94%  Weight:      Height:       Eyes: PERRL, lids and conjunctivae normal ENMT: Mucous membranes are moist. Posterior pharynx clear of any exudate or lesions.Normal dentition.  Neck: normal, supple, no masses, no thyromegaly Respiratory: transmitted upper airway sounds, upper airway wheezing - tachypnea, speaks in short sentences, diffuse wheezing  Cardiovascular: Regular rate and rhythm, no murmurs / rubs / gallops. No extremity edema.  Abdomen: no tenderness, no masses palpated. No hepatosplenomegaly. Bowel sounds positive.  Musculoskeletal: no clubbing / cyanosis. No joint deformity upper and lower extremities. Good ROM, no contractures. Normal muscle tone.  Skin: no rashes, lesions, ulcers. No induration Neurologic: CN 2-12 grossly intact. Sensation intact,   Psychiatric: Normal judgment and insight. Alert and oriented x 3. Normal mood.   Labs on Admission: I have personally reviewed following labs and imaging studies  CBC: Recent Labs  Lab 04/15/21 0900  WBC 7.7  NEUTROABS 6.0  HGB 12.5  HCT 40.4  MCV 88.6  PLT 242    Basic Metabolic Panel: Recent Labs  Lab 04/15/21 0900  NA 135  K 4.2  CL 100  CO2 26  GLUCOSE 108*  BUN 7  CREATININE 0.88  CALCIUM 8.6*    GFR: Estimated Creatinine Clearance: 176.5 mL/min (by C-G formula based on SCr of 0.88 mg/dL).  Liver Function Tests: No results for input(s): AST, ALT, ALKPHOS, BILITOT, PROT, ALBUMIN in the last 168 hours.  Urine analysis:    Component Value Date/Time   COLORURINE YELLOW 05/18/2020 0135   APPEARANCEUR CLEAR 05/18/2020 0135   LABSPEC 1.025 05/18/2020 0135   PHURINE 6.0 05/18/2020 0135   GLUCOSEU NEGATIVE 05/18/2020 0135   HGBUR TRACE ([name]) 05/18/2020 0135   BILIRUBINUR NEGATIVE 05/18/2020 0135   KETONESUR NEGATIVE 05/18/2020 0135   PROTEINUR NEGATIVE 05/18/2020 0135   UROBILINOGEN 1.0 12/24/2013 1510   NITRITE NEGATIVE 05/18/2020 0135   LEUKOCYTESUR NEGATIVE 05/18/2020 0135    Radiological Exams  on Admission: DG Chest Port 1 View  Result Date: 04/15/2021 CLINICAL DATA:  Cough and fever.  Shortness of breath. EXAM: PORTABLE CHEST 1 VIEW COMPARISON:  03/17/2020 FINDINGS: 1011 hours. Low lung volumes. Heart size upper normal. The lungs are clear without focal pneumonia, edema, pneumothorax or pleural effusion. The visualized bony structures of the thorax show no acute abnormality. Telemetry leads overlie the chest. IMPRESSION: Low volume film without acute cardiopulmonary findings. Electronically Signed   By: Kennith Center M.D.   On: 04/15/2021 10:39    EKG: Independently reviewed. Sinus rhythm, similar to priors  Assessment/Plan Active Problems:   Asthma exacerbation   Asthma  Shortness of breath  Asthma Exacerbation  Systemic Inflammatory Response Syndrome Meets criteria for SIRS based on fever, tachycardia, tachypnea - likely 2/2 asthma exacerbation - with fever, suspect  viral infection as trigger Continue steroids, scheduled and prn nebs CXR with low volumes - no acute cardiopulm findings Hold abx at this time Follow RVP, droplet precautions.  Negative covid, influenza [employer]la [employer]&B. Follow BNP She's not on any controller medications - she'll need this at discharge  Tobacco Abuse She's interested in quitting, discussed options - NRT, wellbutrin, chantix.  Will start with NRT.   ?OSA  ?Obesity Hypoventilation Needs outpatient sleep study  Elevated Blood Pressure Follow, consider blood pressure meds if persistent  Obesity Body mass index is 58.58 kg/m. Needs outpatient follow up and management of obesity  Hyperglycemia Follow A1c  Needs med rec, she isn't sure of all her medicines/inhalers - appreciate pharmacy assistance TOC for PCP and med assistance  DVT prophylaxis: lovenox  Code Status:   full  Family Communication:  None at bedside  Disposition Plan:   Patient is from:  home  Anticipated DC to:  home  Anticipated DC date:  5/20  Anticipated DC barriers: Pending  improvement in resp status  Consults called:  none Admission status:  obs  Severity of Illness: The appropriate patient status for this patient is OBSERVATION. Observation status is judged to be reasonable and necessary in order to provide the required intensity of service to ensure the patient's safety. The patient's presenting symptoms, physical exam findings, and initial radiographic and laboratory data in the context of their medical condition is felt to place them at decreased risk for further clinical deterioration. Furthermore, it is anticipated that the patient will be medically stable for discharge from the hospital within 2 midnights of admission. The following factors support the patient status of observation.   " The patient's presenting symptoms include shortness of breath. " The physical exam findings include wheezing, tachypnea. " The initial radiographic and laboratory data are low volume chest x ray .      Lacretia Nicks MD [facility]  How to contact the [facility] Attending or Consulting provider 7A - 7P or covering provider during after hours 7P -7A, for this patient?   1. Check the care team in [facility] and look for Witney Huie) attending/consulting TRH provider listed and b) the [facility] team listed 2. Log into [email or web address] and use [place]'s universal password to access. If you do not have the password, please contact the hospital operator. 3. Locate the [facility] provider you are looking for under [facility] and page to Chika Cichowski number that you can be directly reached. 4. If you still have difficulty reaching the provider, please page the [facility] (Director on Call) for the Hospitalists listed on amion for assistance.  04/15/2021, 5:46 PM

## 2021-04-15 NOTE — ED Notes (Signed)
RT Note: CAT Nebulizer held/awaiting COVID Results.

## 2021-04-15 NOTE — ED Notes (Signed)
CAT(Continuous Aerosol Treatment) with 10 mg Albuterol/1.0 mg Atrovent started at this time and to be given over 1 hour per order, RT was awaiting COVID results, per Protocol, which came back (-), Pt. was made aware at time of order.

## 2021-04-16 ENCOUNTER — Other Ambulatory Visit (HOSPITAL_COMMUNITY): Payer: Self-pay

## 2021-04-16 DIAGNOSIS — J4541 Moderate persistent asthma with (acute) exacerbation: Principal | ICD-10-CM

## 2021-04-16 LAB — COMPREHENSIVE METABOLIC PANEL
ALT: 15 U/L (ref 0–44)
AST: 16 U/L (ref 15–41)
Albumin: 3.5 g/dL (ref 3.5–5.0)
Alkaline Phosphatase: 64 U/L (ref 38–126)
Anion gap: 8 (ref 5–15)
BUN: 11 mg/dL (ref 6–20)
CO2: 25 mmol/L (ref 22–32)
Calcium: 8.7 mg/dL — ABNORMAL LOW (ref 8.9–10.3)
Chloride: 105 mmol/L (ref 98–111)
Creatinine, Ser: 0.86 mg/dL (ref 0.44–1.00)
GFR, Estimated: >60 mL/min (ref >60)
Glucose, Bld: 199 mg/dL — ABNORMAL HIGH (ref 70–99)
Potassium: 3.6 mmol/L (ref 3.5–5.1)
Sodium: 138 mmol/L (ref 135–145)
Total Bilirubin: 0.3 mg/dL (ref 0.3–1.2)
Total Protein: 7.7 g/dL (ref 6.5–8.1)

## 2021-04-16 LAB — RESPIRATORY PANEL BY PCR

## 2021-04-16 LAB — URINALYSIS, ROUTINE W REFLEX MICROSCOPIC
Bilirubin Urine: NEGATIVE
Glucose, UA: >=500 mg/dL — AB
Hgb urine dipstick: NEGATIVE
Ketones, ur: 5 mg/dL — AB
Nitrite: NEGATIVE
Protein, ur: 30 mg/dL — AB
Specific Gravity, Urine: 1.033 — ABNORMAL HIGH (ref 1.005–1.030)
pH: 6 (ref 5.0–8.0)

## 2021-04-16 LAB — CBC
HCT: 41.4 % (ref 36.0–46.0)
Hemoglobin: 12.7 g/dL (ref 12.0–15.0)
MCH: 27.9 pg (ref 26.0–34.0)
MCHC: 30.7 g/dL (ref 30.0–36.0)
MCV: 90.8 fL (ref 80.0–100.0)
Platelets: 353 10*3/uL (ref 150–400)
RBC: 4.56 MIL/uL (ref 3.87–5.11)
RDW: 14.3 % (ref 11.5–15.5)
WBC: 9.4 10*3/uL (ref 4.0–10.5)
nRBC: 0 % (ref 0.0–0.2)

## 2021-04-16 LAB — HIV ANTIBODY (ROUTINE TESTING W REFLEX)
HIV Screen 4th Generation wRfx: NONREACTIVE
HIV Screen 4th Generation wRfx: NONREACTIVE

## 2021-04-16 LAB — BRAIN NATRIURETIC PEPTIDE: B Natriuretic Peptide: 28.8 pg/mL (ref 0.0–100.0)

## 2021-04-16 MED ORDER — ALBUTEROL SULFATE HFA 108 (90 BASE) MCG/ACT IN AERS
2.0000 | INHALATION_SPRAY | RESPIRATORY_TRACT | 2 refills | Status: DC | PRN
  Filled 2021-04-16: qty 18, 16d supply, fill #0

## 2021-04-16 MED ORDER — MOMETASONE FURO-FORMOTEROL FUM 200-5 MCG/ACT IN AERO
2.0000 | INHALATION_SPRAY | Freq: Two times a day (BID) | RESPIRATORY_TRACT | Status: DC
  Administered 2021-04-16: 2 via RESPIRATORY_TRACT
  Filled 2021-04-16: qty 8.8

## 2021-04-16 MED ORDER — NICOTINE 14 MG/24HR TD PT24
14.0000 mg | MEDICATED_PATCH | TRANSDERMAL | 1 refills | Status: AC
  Filled 2021-04-16 – 2021-04-17 (×3): qty 30, 30d supply, fill #0

## 2021-04-16 MED ORDER — NITROFURANTOIN MACROCRYSTAL 100 MG PO CAPS
100.0000 mg | ORAL_CAPSULE | Freq: Two times a day (BID) | ORAL | 0 refills | Status: AC
  Filled 2021-04-16: qty 14, 7d supply, fill #0

## 2021-04-16 MED ORDER — BUPROPION HCL ER (SR) 150 MG PO TB12
150.0000 mg | ORAL_TABLET | Freq: Two times a day (BID) | ORAL | 1 refills | Status: DC
  Filled 2021-04-16: qty 60, 30d supply, fill #0

## 2021-04-16 MED ORDER — PREDNISONE 10 MG PO TABS
ORAL_TABLET | ORAL | 0 refills | Status: AC
  Filled 2021-04-16: qty 32, 10d supply, fill #0

## 2021-04-16 MED ORDER — MOMETASONE FURO-FORMOTEROL FUM 200-5 MCG/ACT IN AERO
2.0000 | INHALATION_SPRAY | Freq: Two times a day (BID) | RESPIRATORY_TRACT | 2 refills | Status: DC
  Filled 2021-04-16: qty 13, 30d supply, fill #0
  Filled 2021-04-16: qty 1, fill #0

## 2021-04-16 NOTE — Discharge Summary (Addendum)
Physician Discharge Summary  Natalie Ramsey YCX:[id number] DOB: [birth year] DOA: 04/15/2021  PCP: Patient, No Pcp Per (Inactive)  Admit date: 04/15/2021 Discharge date: 04/16/2021  Time spent: 40 minutes  Recommendations for Outpatient Follow-up:  1. Follow outpatient CBC/CMP 2. Follow respiratory symptoms with dulera and albuterol prn - adjust regimen for asthma as needed 3. Encourage smoking cessation - discharged with nicotine patches, wellbutrin to aid with smoking cessation  4. C/o dysuria, discharged with macrobid - follow outpatient - no culture collected as we discussed her symptoms only just prior to discharge - follow outpatient - needs repeat UA 5. Follow prediabetes outpatient 6. Needs outpatient sleep study 7. Follow blood pressure outpatient - intermittently elevated here, improved at time of discharge  Discharge Diagnoses:  Active Problems:   Asthma exacerbation   Asthma   Discharge Condition: stable  Diet recommendation: heart healthy  Filed Weights   04/15/21 0846 04/15/21 1720 04/16/21 0443  Weight: (!) 192.9 kg (!) 190.5 kg (!) 190.4 kg    History of present illness:  Natalie Ramsey is Natalie Ramsey 30 y.o. female with medical history significant of asthma, obesity, tobacco abuse who presents with shortness of breath for the past 24 hrs or so.    Symptoms started yesterday with nasal congestion and drainage in back of her throat.  She thought it was allergies, then her "asthma kicked in".  She developed cough and chills.  Had increasing shortness of breath only partially improved with albuterol.  She notes post tussive emesis and diarrhea as well.  Mildly productive cough.  Chest pain with cough.  Fever noted in ED.  No abdominal pain, numbness, tingling, weakness, or swelling.  No hx intubation for asthma.  Last seen in hospital in March for Asthma.  She does not have Natalie Ramsey PCP.  Notes social drinking.  Tobacco use.  No other drugs.     She was admitted for an asthma  exacerbation, improved after receiving steroids, nebs.  RVP positive for rhinovirus.  She's improved on 5/20, feels well enough for discharge, not requiring supplemental oxygen with activity.  Discharge with steroid taper, dulera, albuterol.    See below for additional details  Hospital Course:  Shortness of breath  Asthma Exacerbation  Systemic Inflammatory Response Syndrome Meets criteria for SIRS based on fever, tachycardia, tachypnea - likely 2/2 asthma exacerbation - with fever, suspect viral infection as trigger Continue steroids, scheduled and prn nebs CXR with low volumes - no acute cardiopulm findings Hold abx at this time Follow RVP, droplet precautions.  Negative covid, influenza Natalie Ramsey&B. Follow BNP She's not on any controller medications - she'll need this at discharge  Tobacco Abuse She's interested in quitting, discussed options - NRT, wellbutrin, chantix.  Will start with NRT. She'd like to try NRT and wellbutrin No hx seizures.  Discussed wellbutrin and side effects.     ?OSA  ?Obesity Hypoventilation Needs outpatient sleep study  Elevated Blood Pressure Follow, consider blood pressure meds if persistent  Obesity Body mass index is 58.58 kg/m. Needs outpatient follow up and management of obesity  Prediabetes Follow outpatient with PCP  Abnormal UA Concern for UTI based on symptoms, follow repeat UA outpatient after treatment  Procedures: none  Consultations:  none  Discharge Exam: Vitals:   04/16/21 0911 04/16/21 1317  BP: (!) 149/67 135/84  Pulse: 62 81  Resp: 18 19  Temp: 99.6 F (37.6 C) 99.2 F (37.3 C)  SpO2: 91% 91%   No new complaints Feeling well enough to go  home Denies depression anxiety Denies hx seizures Would like to try nicotine patch/wellbutrin for smoking cessation  General: No acute distress. Cardiovascular: Heart sounds show Natalie Ramsey regular rate, and rhythm. No gallops or rubs. No murmurs. No JVD. Lungs: Clear to  auscultation bilaterally with good air movement. No rales, rhonchi or wheezes. Abdomen: Soft, nontender, nondistended with normal active bowel sounds. No masses. No hepatosplenomegaly. Neurological: Alert and oriented 3. Moves all extremities 4 with equal strength. Cranial nerves II through XII grossly intact. Skin: Warm and dry. No rashes or lesions. Extremities: No clubbing or cyanosis. Non pitting edema.   Discharge Instructions   Discharge Instructions    Call MD for:  difficulty breathing, headache or visual disturbances   Complete by: As directed    Call MD for:  extreme fatigue   Complete by: As directed    Call MD for:  hives   Complete by: As directed    Call MD for:  persistant dizziness or light-headedness   Complete by: As directed    Call MD for:  persistant nausea and vomiting   Complete by: As directed    Call MD for:  redness, tenderness, or signs of infection (pain, swelling, redness, odor or green/yellow discharge around incision site)   Complete by: As directed    Call MD for:  severe uncontrolled pain   Complete by: As directed    Call MD for:  temperature >100.4   Complete by: As directed    Diet - low sodium heart healthy   Complete by: As directed    Discharge instructions   Complete by: As directed    You were seen for an asthma exacerbation.    You've improved with steroids and breathing treatments.  We'll send you home with steroids to take as Natalie Ramsey taper.  We'll also send you with new prescriptions for asthma medicines.  Natalie Ramsey is Natalie Ramsey controller medicine you should take everyday, even if you feel well.  We'll send you with albuterol to take as needed for shortness of breath.  You had rhinovirus, which caused your exacerbation.  Smoking cessation is extremely important.  I'll send you home with nicotine patches to help decrease the urge to smoke.  I'll also send you with Natalie Ramsey prescription for wellbutrin, which can help with smoking cessation.  Pick Natalie Ramsey day to stop  smoking.  Start the wellbutrin about 1 week before your "quit date".  Start 150 mg daily for 3 days, if you tolerate this well, increase to 150 mg twice daily (if you don't tolerate 150 mg twice daily, you can continue 150 mg daily).  This can be continued for 12 weeks to help with smoking cessation.   You should have Kupono Marling sleep study as an outpatient.   You have prediabetes.  This should be followed outpatient.  You do not have diabetes, but increased risk of developing diabetes.  Diet, exercise, and maintaining Kennadie Brenner healthy weight are important to prevent progression to diabetes.  Return for new, recurrent, or worsening symptoms.  Please ask your PCP to request records from this hospitalization so they know what was done and what the next steps will be.   Increase activity slowly   Complete by: As directed      Allergies as of 04/16/2021      Reactions   Ceftriaxone    Patient developed exfoliative dermatitis after taking 3 antibiotics, including Levaquin, ceftriaxone and Zosyn. Not very sure which one caused this problem.   Levaquin [levofloxacin In D5w]  Patient developed exfoliative dermatitis after taking 3 antibiotics, including Levaquin, ceftriaxone and Zosyn. Not very sure which one caused this problem.   Zosyn [piperacillin Sod-tazobactam So]    Patient developed exfoliative dermatitis after taking 3 antibiotics, including Levaquin, ceftriaxone and Zosyn. Not very sure which one caused this problem.      Medication List    STOP taking these medications   diphenhydrAMINE 25 MG tablet Commonly known as: BENADRYL     TAKE these medications   albuterol (2.5 MG/3ML) 0.083% nebulizer solution Commonly known as: PROVENTIL Take 3 mLs (2.5 mg total) by nebulization every 4 (four) hours as needed for wheezing or shortness of breath.   albuterol 108 (90 Base) MCG/ACT inhaler Commonly known as: VENTOLIN HFA Inhale 2 puffs into the lungs every 4 (four) hours as needed for wheezing or  shortness of breath.   buPROPion 150 MG 12 hr tablet Commonly known as: Wellbutrin SR Take 1 tablet (150 mg total) by mouth 2 (two) times daily. (start with 150 mg daily for 3 days before increasing to 150 mg twice daily)   mometasone-formoterol 200-5 MCG/ACT Aero Commonly known as: DULERA Inhale 2 puffs into the lungs 2 (two) times daily.   nicotine 14 mg/24hr patch Commonly known as: NICODERM CQ - dosed in mg/24 hours Place 1 patch (14 mg total) onto the skin daily.   predniSONE 10 MG tablet Commonly known as: DELTASONE Take 6 tablets (60 mg total) by mouth daily for 2 days, THEN 4 tablets (40 mg total) daily for 2 days, THEN 3 tablets (30 mg total) daily for 2 days, THEN 2 tablets (20 mg total) daily for 2 days, THEN 1 tablet (10 mg total) daily for 2 days. Start taking on: Apr 16, 2021      Allergies  Allergen Reactions  . Ceftriaxone     Patient developed exfoliative dermatitis after taking 3 antibiotics, including Levaquin, ceftriaxone and Zosyn. Not very sure which one caused this problem.  Barbera Setters. Levaquin [Levofloxacin In D5w]     Patient developed exfoliative dermatitis after taking 3 antibiotics, including Levaquin, ceftriaxone and Zosyn. Not very sure which one caused this problem.  Marland Kitchen. Zosyn [Piperacillin Sod-Tazobactam So]     Patient developed exfoliative dermatitis after taking 3 antibiotics, including Levaquin, ceftriaxone and Zosyn. Not very sure which one caused this problem.    Follow-up Information    [facility]27/2021.   Specialty: Internal Medicine Why: Tuesday at 8:50 for your hospital follow up appointment. I asked them to put you on Marian Meneely wait list-they will call if an opening occurs sooner. You can get Isabelle Matt one time refill of medications at their pharmacy Contact information: [address] [id number] [place] [phone]               The results of significant diagnostics from  this hospitalization (including imaging, microbiology, ancillary and laboratory) are listed below for reference.    Significant Diagnostic Studies: DG Chest Port 1 View  Result Date: 04/15/2021 CLINICAL DATA:  Cough and fever.  Shortness of breath. EXAM: PORTABLE CHEST 1 VIEW COMPARISON:  03/17/2020 FINDINGS: 1011 hours. Low lung volumes. Heart size upper normal. The lungs are clear without focal pneumonia, edema, pneumothorax or pleural effusion. The visualized bony structures of the thorax show no acute abnormality. Telemetry leads overlie the chest. IMPRESSION: Low volume film without acute cardiopulmonary findings. Electronically Signed   By: Kennith CenterEric  Mansell M.D.   On: 04/15/2021 10:39  Microbiology: Recent Results (from the past 240 hour(s))  Resp Panel by RT-PCR (Flu Barrie Wale&B, Covid) Nasopharyngeal Swab     Status: None   Collection Time: 04/15/21 10:02 AM   Specimen: Nasopharyngeal Swab; Nasopharyngeal(NP) swabs in vial transport medium  Result Value Ref Range Status   SARS Coronavirus 2 by RT PCR NEGATIVE NEGATIVE Final    Comment: (NOTE) SARS-CoV-2 target nucleic acids are NOT DETECTED.  The SARS-CoV-2 RNA is generally detectable in upper respiratory specimens during the acute phase of infection. The lowest concentration of SARS-CoV-2 viral copies this assay can detect is 138 copies/mL. Hadley Soileau negative result does not preclude SARS-Cov-2 infection and should not be used as the sole basis for treatment or other patient management decisions. Arlind Klingerman negative result may occur with  improper specimen collection/handling, submission of specimen other than nasopharyngeal swab, presence of viral mutation(s) within the areas targeted by this assay, and inadequate number of viral copies(<138 copies/mL). Kayston Jodoin negative result must be combined with clinical observations, patient history, and epidemiological information. The expected result is Negative.  Fact Sheet for Patients:   BloggerCourse.com  Fact Sheet for Healthcare Providers:  SeriousBroker.it  This test is no t yet approved or cleared by the [place] FDA and  has been authorized for detection and/or diagnosis of SARS-CoV-2 by FDA under an Emergency Use Authorization (EUA). This EUA will remain  in effect (meaning this test can be used) for the duration of the COVID-19 declaration under Section 564(b)(1) of the Act, 21 U.S.C.section 360bbb-3(b)(1), unless the authorization is terminated  or revoked sooner.       Influenza Josi Roediger by PCR NEGATIVE NEGATIVE Final   Influenza B by PCR NEGATIVE NEGATIVE Final    Comment: (NOTE) The Xpert Xpress SARS-CoV-2/FLU/RSV plus assay is intended as an aid in the diagnosis of influenza from Nasopharyngeal swab specimens and should not be used as Pawan Knechtel sole basis for treatment. Nasal washings and aspirates are unacceptable for Xpert Xpress SARS-CoV-2/FLU/RSV testing.  Fact Sheet for Patients: BloggerCourse.com  Fact Sheet for Healthcare Providers: SeriousBroker.it  This test is not yet approved or cleared by the [place] FDA and has been authorized for detection and/or diagnosis of SARS-CoV-2 by FDA under an Emergency Use Authorization (EUA). This EUA will remain in effect (meaning this test can be used) for the duration of the COVID-19 declaration under Section 564(b)(1) of the Act, 21 U.S.C. section 360bbb-3(b)(1), unless the authorization is terminated or revoked.  Performed at [facility], [address], [place], [place] [address]   Respiratory (~20 pathogens) panel by PCR     Status: Abnormal   Collection Time: 04/15/21  5:30 PM   Specimen: Nasopharyngeal Swab; Respiratory  Result Value Ref Range Status   Adenovirus NOT DETECTED NOT DETECTED Final   Coronavirus 229E NOT DETECTED NOT DETECTED Final    Comment: (NOTE) The  Coronavirus on the Respiratory Panel, DOES NOT test for the novel  Coronavirus (2019 nCoV)    Coronavirus HKU1 NOT DETECTED NOT DETECTED Final   Coronavirus NL63 NOT DETECTED NOT DETECTED Final   Coronavirus OC43 NOT DETECTED NOT DETECTED Final   Metapneumovirus NOT DETECTED NOT DETECTED Final   Rhinovirus / Enterovirus DETECTED (Zaydrian Batta) NOT DETECTED Final   Influenza Shardai Star NOT DETECTED NOT DETECTED Final   Influenza B NOT DETECTED NOT DETECTED Final   Parainfluenza Virus 1 NOT DETECTED NOT DETECTED Final   Parainfluenza Virus 2 NOT DETECTED NOT DETECTED Final   Parainfluenza Virus 3 NOT DETECTED NOT DETECTED Final  Parainfluenza Virus 4 NOT DETECTED NOT DETECTED Final   Respiratory Syncytial Virus NOT DETECTED NOT DETECTED Final   Bordetella pertussis NOT DETECTED NOT DETECTED Final   Bordetella Parapertussis NOT DETECTED NOT DETECTED Final   Chlamydophila pneumoniae NOT DETECTED NOT DETECTED Final   Mycoplasma pneumoniae NOT DETECTED NOT DETECTED Final    Comment: Performed at [facility], [address] [place], [place] [address]     Labs: Basic Metabolic Panel: Recent Labs  Lab 04/15/21 0900 04/16/21 0450  NA 135 138  K 4.2 3.6  CL 100 105  CO2 26 25  GLUCOSE 108* 199*  BUN 7 11  CREATININE 0.88 0.86  CALCIUM 8.6* 8.7*   Liver Function Tests: Recent Labs  Lab 04/16/21 0450  AST 16  ALT 15  ALKPHOS 64  BILITOT 0.3  PROT 7.7  ALBUMIN 3.5   No results for input(s): LIPASE, AMYLASE in the last 168 hours. No results for input(s): AMMONIA in the last 168 hours. CBC: Recent Labs  Lab 04/15/21 0900 04/16/21 0450  WBC 7.7 9.4  NEUTROABS 6.0  --   HGB 12.5 12.7  HCT 40.4 41.4  MCV 88.6 90.8  PLT 242 353   Cardiac Enzymes: No results for input(s): CKTOTAL, CKMB, CKMBINDEX, TROPONINI in the last 168 hours. BNP: BNP (last 3 results) Recent Labs    04/15/21 1837 04/16/21 0450  BNP 19.8 28.8    ProBNP (last 3 results) No results for input(s): PROBNP in  the last 8760 hours.  CBG: No results for input(s): GLUCAP in the last 168 hours.     Signed:  Lacretia Nicks MD.  [facility] 04/16/2021, 3:25 PM

## 2021-04-16 NOTE — TOC Progression Note (Addendum)
Transition of Care [facility]) - Progression Note    Patient Details  Name: Natalie Ramsey MRN: [id number] Date of Birth: [birth year]  Transition of [facility]) CM/SW Contact  Ida Rogue, [place] Phone Number: 04/16/2021, 3:06 PM  Clinical Narrative:  [facility] worker responding to MD consult for PCP need.  Secured appointment for patient at [facility] and [facility].  Unfortunately, they had nothing until August.  I asked that they put him on cancellation list.  Also, he was advised in AVS that he can get a one time free refill through their pharmacy if needed. TOC will continue to follow during the course of hospitalization.  Addendum:  MATCH voucher provided for patient who is picking up meds from [facility].       Barriers to Discharge: No Barriers Identified  Expected Discharge Plan and Services                                                 Social Determinants of Health (SDOH) Interventions    Readmission Risk Interventions No flowsheet data found.

## 2021-04-16 NOTE — Progress Notes (Signed)
SATURATION QUALIFICATIONS: (This note is used to comply with regulatory documentation for home oxygen)  Patient Saturations on Room Air at Rest = 95%  Patient Saturations on Room Air while Ambulating = 89-92%  Patient Saturations on  1  Liters of oxygen while Ambulating = 95%

## 2021-04-16 NOTE — Plan of Care (Signed)
Educated patient on how to use Dulera inhaler.   Problem: Education: Goal: Knowledge of General Education information will improve Description: Including pain rating scale, medication(s)/side effects and non-pharmacologic comfort measures Outcome: Adequate for Discharge   Problem: Health Behavior/Discharge Planning: Goal: Ability to manage health-related needs will improve Outcome: Adequate for Discharge   Problem: Clinical Measurements: Goal: Ability to maintain clinical measurements within normal limits will improve Outcome: Adequate for Discharge Goal: Will remain free from infection Outcome: Adequate for Discharge Goal: Diagnostic test results will improve Outcome: Adequate for Discharge Goal: Respiratory complications will improve Outcome: Adequate for Discharge Goal: Cardiovascular complication will be avoided Outcome: Adequate for Discharge

## 2021-04-16 NOTE — Discharge Instructions (Signed)
Asthma, Adult  Asthma is Natalie Ramsey long-term (chronic) condition in which the airways get tight and narrow. The airways are the breathing passages that lead from the nose and mouth down into the lungs. Natalie Ramsey person with asthma will have times when symptoms get worse. These are called asthma attacks. They can cause coughing, whistling sounds when you breathe (wheezing), shortness of breath, and chest pain. They can make it hard to breathe. There is no cure for asthma, but medicines and lifestyle changes can help control it. There are many things that can bring on an asthma attack or make asthma symptoms worse (triggers). Common triggers include:  Mold.  Dust.  Cigarette smoke.  Cockroaches.  Things that can cause allergy symptoms (allergens). These include animal skin flakes (dander) and pollen from trees or grass.  Things that pollute the air. These may include household cleaners, wood smoke, smog, or chemical odors.  Cold air, weather changes, and wind.  Crying or laughing hard.  Stress.  Certain medicines or drugs.  Certain foods such as dried fruit, potato chips, and grape juice.  Infections, such as Natalie Ramsey cold or the flu.  Certain medical conditions or diseases.  Exercise or tiring activities. Asthma may be treated with medicines and by staying away from the things that cause asthma attacks. Types of medicines may include:  Controller medicines. These help prevent asthma symptoms. They are usually taken every day.  Fast-acting reliever or rescue medicines. These quickly relieve asthma symptoms. They are used as needed and provide short-term relief.  Allergy medicines if your attacks are brought on by allergens.  Medicines to help control the body's defense (immune) system. Follow these instructions at home: Avoiding triggers in your home  Change your heating and air conditioning filter often.  Limit your use of fireplaces and wood stoves.  Get rid of pests (such as roaches and  mice) and their droppings.  Throw away plants if you see mold on them.  Clean your floors. Dust regularly. Use cleaning products that do not smell.  Have someone vacuum when you are not home. Use Natalie Ramsey vacuum cleaner with Natalie Ramsey HEPA filter if possible.  Replace carpet with wood, tile, or vinyl flooring. Carpet can trap animal skin flakes and dust.  Use allergy-proof pillows, mattress covers, and box spring covers.  Wash bed sheets and blankets every week in hot water. Dry them in Natalie Ramsey dryer.  Keep your bedroom free of any triggers.  Avoid pets and keep windows closed when things that cause allergy symptoms are in the air.  Use blankets that are made of polyester or cotton.  Clean bathrooms and kitchens with bleach. If possible, have someone repaint the walls in these rooms with mold-resistant paint. Keep out of the rooms that are being cleaned and painted.  Wash your hands often with soap and water. If soap and water are not available, use hand sanitizer.  Do not allow anyone to smoke in your home. General instructions  Take over-the-counter and prescription medicines only as told by your doctor. ? Talk with your doctor if you have questions about how or when to take your medicines. ? Make note if you need to use your medicines more often than usual.  Do not use any products that contain nicotine or tobacco, such as cigarettes and e-cigarettes. If you need help quitting, ask your doctor.  Stay away from secondhand smoke.  Avoid doing things outdoors when allergen counts are high and when air quality is low.  Wear Natalie Ramsey ski mask   when doing outdoor activities in the winter. The mask should cover your nose and mouth. Exercise indoors on cold days if you can.  Warm up before you exercise. Take time to cool down after exercise.  Use Natalie Ramsey peak flow meter as told by your doctor. Natalie Ramsey peak flow meter is Natalie Ramsey tool that measures how well the lungs are working.  Keep track of the peak flow meter's readings.  Write them down.  Follow your asthma action plan. This is Natalie Ramsey written plan for taking care of your asthma and treating your attacks.  Make sure you get all the shots (vaccines) that your doctor recommends. Ask your doctor about Natalie Ramsey flu shot and Natalie Ramsey pneumonia shot.  Keep all follow-up visits as told by your doctor. This is important. Contact Natalie Ramsey doctor if:  You have wheezing, shortness of breath, or Natalie Ramsey cough even while taking medicine to prevent attacks.  The mucus you cough up (sputum) is thicker than usual.  The mucus you cough up changes from clear or white to yellow, green, gray, or bloody.  You have problems from the medicine you are taking, such as: ? Natalie Ramsey rash. ? Itching. ? Swelling. ? Trouble breathing.  You need reliever medicines more than 2-3 times Natalie Ramsey week.  Your peak flow reading is still at 50-79% of your personal best after following the action plan for 1 hour.  You have Natalie Ramsey fever. Get help right away if:  You seem to be worse and are not responding to medicine during an asthma attack.  You are short of breath even at rest.  You get short of breath when doing very little activity.  You have trouble eating, drinking, or talking.  You have chest pain or tightness.  You have Natalie Ramsey fast heartbeat.  Your lips or fingernails start to turn blue.  You are light-headed or dizzy, or you faint.  Your peak flow is less than 50% of your personal best.  You feel too tired to breathe normally. Summary  Asthma is Natalie Ramsey long-term (chronic) condition in which the airways get tight and narrow. An asthma attack can make it hard to breathe.  Asthma cannot be cured, but medicines and lifestyle changes can help control it.  Make sure you understand how to avoid triggers and how and when to use your medicines. This information is not intended to replace advice given to you by your health care provider. Make sure you discuss any questions you have with your health care provider. Document Revised:  03/18/2020 Document Reviewed: 03/18/2020 Elsevier Patient Education  2021 [employer]   

## 2021-04-16 NOTE — TOC Progression Note (Signed)
Transition of Care [facility]) - Progression Note    Patient Details  Name: Natalie Ramsey MRN: [id number] Date of Birth: 12-Aug-1991  Transition of Care [facility]) CM/SW Contact  Geni Bers, RN Phone Number: 04/16/2021, 4:20 PM  Clinical Narrative:     MATCH given to pt. One time use from today 04/16/21 to 04/16/22. Pt was made aware by CSW.     Barriers to Discharge: No Barriers Identified  Expected Discharge Plan and Services           Expected Discharge Date: 04/16/21                                     Social Determinants of Health (SDOH) Interventions    Readmission Risk Interventions No flowsheet data found.

## 2021-04-17 ENCOUNTER — Other Ambulatory Visit (HOSPITAL_COMMUNITY): Payer: Self-pay

## 2021-04-20 ENCOUNTER — Other Ambulatory Visit (HOSPITAL_COMMUNITY): Payer: Self-pay

## 2021-07-27 ENCOUNTER — Ambulatory Visit: Payer: Self-pay | Admitting: Internal Medicine

## 2021-08-24 ENCOUNTER — Emergency Department (HOSPITAL_BASED_OUTPATIENT_CLINIC_OR_DEPARTMENT_OTHER)
Admission: EM | Admit: 2021-08-24 | Discharge: 2021-08-24 | Disposition: A | Discharge status: Home / Self Care | Payer: Self-pay | Attending: Emergency Medicine | Admitting: Emergency Medicine

## 2021-08-24 ENCOUNTER — Encounter (HOSPITAL_BASED_OUTPATIENT_CLINIC_OR_DEPARTMENT_OTHER): Payer: Self-pay | Admitting: *Deleted

## 2021-08-24 ENCOUNTER — Other Ambulatory Visit: Payer: Self-pay

## 2021-08-24 DIAGNOSIS — F17210 Nicotine dependence, cigarettes, uncomplicated: Secondary | ICD-10-CM | POA: Insufficient documentation

## 2021-08-24 DIAGNOSIS — R059 Cough, unspecified: Secondary | ICD-10-CM

## 2021-08-24 DIAGNOSIS — J3489 Other specified disorders of nose and nasal sinuses: Secondary | ICD-10-CM | POA: Insufficient documentation

## 2021-08-24 DIAGNOSIS — J45901 Unspecified asthma with (acute) exacerbation: Secondary | ICD-10-CM | POA: Insufficient documentation

## 2021-08-24 DIAGNOSIS — Z28310 Unvaccinated for covid-19: Secondary | ICD-10-CM | POA: Insufficient documentation

## 2021-08-24 DIAGNOSIS — R110 Nausea: Secondary | ICD-10-CM | POA: Insufficient documentation

## 2021-08-24 DIAGNOSIS — U071 COVID-19: Secondary | ICD-10-CM | POA: Insufficient documentation

## 2021-08-24 DIAGNOSIS — Z7951 Long term (current) use of inhaled steroids: Secondary | ICD-10-CM | POA: Insufficient documentation

## 2021-08-24 LAB — RESP PANEL BY RT-PCR (FLU A&B, COVID) ARPGX2
Influenza A by PCR: NEGATIVE
Influenza B by PCR: NEGATIVE
SARS Coronavirus 2 by RT PCR: POSITIVE — AB

## 2021-08-24 MED ORDER — ALBUTEROL SULFATE (2.5 MG/3ML) 0.083% IN NEBU: 2.5000 mg | INHALATION_SOLUTION | RESPIRATORY_TRACT | 2 refills | Status: DC | PRN

## 2021-08-24 MED ORDER — NIRMATRELVIR/RITONAVIR (PAXLOVID)TABLET: 3.0000 | ORAL_TABLET | Freq: Two times a day (BID) | ORAL | 0 refills | Status: AC

## 2021-08-24 MED ORDER — ALBUTEROL SULFATE HFA 108 (90 BASE) MCG/ACT IN AERS
2.0000 | INHALATION_SPRAY | RESPIRATORY_TRACT | 2 refills | Status: DC | PRN
Start: 2021-08-24 — End: 2021-09-24

## 2021-08-24 MED ORDER — ONDANSETRON 4 MG PO TBDP: 4.0000 mg | ORAL_TABLET | Freq: Three times a day (TID) | ORAL | 0 refills | Status: DC | PRN

## 2021-08-24 MED ORDER — IBUPROFEN 800 MG PO TABS
800.0000 mg | ORAL_TABLET | Freq: Once | ORAL | Status: AC
  Administered 2021-08-24: 800 mg via ORAL
  Filled 2021-08-24: qty 1

## 2021-08-24 MED ORDER — ALBUTEROL SULFATE HFA 108 (90 BASE) MCG/ACT IN AERS
2.0000 | INHALATION_SPRAY | Freq: Once | RESPIRATORY_TRACT | Status: AC
  Administered 2021-08-24: 2 via RESPIRATORY_TRACT
  Filled 2021-08-24: qty 6.7

## 2021-08-24 MED ORDER — ACETAMINOPHEN 325 MG PO TABS: 650.0000 mg | ORAL_TABLET | Freq: Four times a day (QID) | ORAL | 0 refills | Status: DC | PRN

## 2021-08-24 NOTE — ED Triage Notes (Signed)
Chills, fever, weakness, emesis yesterday.  Took Nyquill, Tylenol, Aleve with no relief.

## 2021-08-24 NOTE — ED Provider Notes (Signed)
[facility] EMERGENCY DEPT Provider Note   CSN: [id number] Arrival date & time: 08/24/21  1727     History Chief Complaint  Patient presents with   Weakness   Chills   Fever    Natalie Ramsey is a 30 y.o. female.  This is a 30 y.o. female with significant medical history as below, including asthma, obesity who presents to the ED with complaint of cough, fever, chills, arthralgias, malaise. Known sick contacts at school. She is not vaccinated against covid 19  Duration:  24-36 hours Onset:  gradual Timing:  constant Description:  aching Severity:  mild Exacerbating/Alleviating Factors:  rest, home inhaler Associated Symptoms:  cough, congestion, malaise, fatigue, mild dyspnea Pertinent Negatives:  no IVDU, not immunocompromised   The history is provided by the patient. The history is limited by the condition of the patient.  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 No pertinent surgical history.   OB History     Gravida  0   Para  0   Term  0   Preterm  0   AB  0   Living  0      SAB  0   IAB  0   Ectopic  0   Multiple  0   Live Births  0            History reviewed. No pertinent family history.  Social History   Tobacco Use   Smoking status: Some Days    Packs/day: 0.50    Types: Cigarettes   Smokeless tobacco: Never  Vaping Use   Vaping Use: Never used  Substance Use Topics   Alcohol use: Not Currently    Comment: occ   Drug use: No    Home Medications Prior to Admission medications   Medication Sig Start Date End Date Taking? Authorizing Provider  acetaminophen (TYLENOL) 325 MG tablet Take 2 tablets (650 mg total) by mouth every 6 (six) hours as needed. 08/24/21  Yes Sloan Leiter, DO  nirmatrelvir/ritonavir EUA (PAXLOVID) 20 x 150 MG & 10 x 100MG  TABS Take 3 tablets by mouth 2 (two) times daily for 5 days. Take nirmatrelvir (150 mg) two tablets twice daily for 5 days and ritonavir (100 mg) one tablet twice daily for 5 days. 08/24/21 08/29/21 Yes 10/29/21 A, DO  ondansetron (ZOFRAN ODT) 4 MG disintegrating tablet Take 1 tablet (4 mg total) by mouth every 8 (eight) hours as needed for nausea or vomiting. 08/24/21  Yes 08/26/21 A, DO  albuterol (PROVENTIL) (2.5 MG/3ML) 0.083% nebulizer solution Take 3 mLs (2.5 mg total) by nebulization every 4 (four) hours as needed for wheezing or shortness of breath. 08/24/21  Tanda Rockers A, DO  albuterol (VENTOLIN HFA) 108 (90 Base) MCG/ACT inhaler Inhale 2 puffs into the lungs every 4 (four) hours as needed for wheezing or shortness of breath. 08/24/21 09/23/21  Sloan Leiter, DO  buPROPion (WELLBUTRIN SR) 150 MG 12 hr tablet Take 1 tablet (150 mg total) by mouth once daily for 3 days then 2 (two) times daily thereafter. 04/16/21 05/17/21  Zigmund Daniel., MD  mometasone-formoterol [facility]) 200-5 MCG/ACT AERO Inhale 2 puffs into the lungs 2 (two) times daily. 04/16/21 05/17/21  Zigmund Daniel., MD  Norethindrone Acetate-Ethinyl Estrad-FE (LOESTRIN 24 FE) 1-20 MG-MCG(24) tablet Take 1 tablet by mouth daily. Patient not taking: Reported on 03/17/2020 12/13/19 12/06/20   Willodean Rosenthal, MD    Allergies    Ceftriaxone, Levaquin [levofloxacin in d5w], and Zosyn [piperacillin sod-tazobactam so]  Review of Systems   Review of Systems  Constitutional:  Positive for fatigue and fever. Negative for chills.  HENT:  Positive for congestion and rhinorrhea. Negative for facial swelling and trouble swallowing.   Eyes:  Negative for photophobia and visual disturbance.  Respiratory:  Positive for cough and shortness of breath.   Cardiovascular:  Negative for chest pain and palpitations.  Gastrointestinal:  Positive for nausea. Negative for abdominal pain and vomiting.  Endocrine: Negative for polydipsia and polyuria.  Genitourinary:  Negative for difficulty urinating and hematuria.  Musculoskeletal:  Negative for gait problem and joint swelling.  Skin:  Negative for pallor and rash.  Neurological:  Negative for dizziness, syncope, numbness and headaches.  Psychiatric/Behavioral:  Negative for agitation and confusion.    Physical Exam Updated Vital Signs BP 121/68 (BP Location: Right Arm)   Pulse 68   Temp 99.3 F (37.4 C) (Oral)   Resp 18   Ht 5\' 11"  (1.803 m)   Wt (!) 193.2 kg   LMP 08/24/2021   SpO2 96%   BMI 59.41 kg/m   Physical Exam Vitals and nursing note reviewed.  Constitutional:      General: She is not in acute distress.    Appearance: Normal appearance. She is well-developed. She is morbidly obese. She is not ill-appearing, toxic-appearing or diaphoretic.  HENT:     Head: Normocephalic and atraumatic.     Right Ear: External ear normal.     Left Ear: External ear normal.     Nose: Nose normal.     Mouth/Throat:     Mouth: Mucous membranes are moist.  Eyes:     General: No scleral icterus.       Right eye: No discharge.        Left eye: No discharge.  Cardiovascular:     Rate and Rhythm: Normal rate and regular rhythm.     Pulses: Normal pulses.     Heart sounds: Normal heart sounds.  Pulmonary:     Effort: Pulmonary  effort is normal. No respiratory distress.     Breath sounds: Wheezing present.  Abdominal:     General: Abdomen is flat.     Tenderness: There is no abdominal tenderness.  Musculoskeletal:        General: Normal range of motion.     Cervical back: Normal range of motion.     Right lower leg: No edema.     Left lower leg: No edema.  Skin:    General: Skin is warm and dry.     Capillary Refill: Capillary refill takes less than 2 seconds.  Neurological:     Mental Status: She  is alert and oriented to person, place, and time.     GCS: GCS eye subscore is 4. GCS verbal subscore is 5. GCS motor subscore is 6.  Psychiatric:        Mood and Affect: Mood normal.        Behavior: Behavior normal. Behavior is cooperative.    ED Results / Procedures / Treatments   Labs (all labs ordered are listed, but only abnormal results are displayed) Labs Reviewed  RESP PANEL BY RT-PCR (FLU A&B, COVID) ARPGX2 - Abnormal; Notable for the following components:      Result Value   SARS Coronavirus 2 by RT PCR POSITIVE (*)    All other components within normal limits    EKG None  Radiology No results found.  Procedures Procedures   Medications Ordered in ED Medications  ibuprofen (ADVIL) tablet 800 mg (800 mg Oral Given 08/24/21 1831)  albuterol (VENTOLIN HFA) 108 (90 Base) MCG/ACT inhaler 2 puff (2 puffs Inhalation Given 08/24/21 2249)    ED Course  I have reviewed the triage vital signs and the nursing notes.  Pertinent labs & imaging results that were available during my care of the patient were reviewed by me and considered in my medical decision making (see chart for details).    MDM Rules/Calculators/A&P                         This patient complains of COVID19; this involves an extensive number of treatment Options and is a complaint that carries with it a high risk of complications and Morbidity. Vital signs reviewed and are stable. Serious etiologies considered.   I ordered,  reviewed and interpreted labs, covid testing which was positive I ordered medication albuterol, motrin   Previous records obtained and reviewed     Patient found to be positive for covid 19, she is not vaccinated. She is tolerating PO, no emesis. She has some mild wheezing and was given albuterol, symptoms improved. Initially febrile but this has resolved after motrin. She does not meet criteria for admission. Discussed supportive care, given refills on albuterol inhaler and nebulizer. Discussed symptomatic management. Advised patient isolate for 10 days from symptom onset, wear a mask when around other people. Wash hands frequently. Strict return precautions discussed.  Close pcp f/u  The patient improved significantly and was discharged in stable condition. Detailed discussions were had with the patient regarding current findings, and need for close f/u with PCP or on call doctor. The patient has been instructed to return immediately if the symptoms worsen in any way for re-evaluation. Patient verbalized understanding and is in agreement with current care plan. All questions answered prior to discharge.   Final Clinical Impression(s) / ED Diagnoses Final diagnoses:  COVID-19  Cough    Rx / DC Orders ED Discharge Orders          Ordered    nirmatrelvir/ritonavir EUA (PAXLOVID) 20 x 150 MG & 10 x 100MG  TABS  2 times daily        08/24/21 2229    albuterol (VENTOLIN HFA) 108 (90 Base) MCG/ACT inhaler  Every 4 hours PRN        08/24/21 2229    albuterol (PROVENTIL) (2.5 MG/3ML) 0.083% nebulizer solution  Every 4 hours PRN        08/24/21 2229    acetaminophen (TYLENOL) 325 MG tablet  Every 6 hours PRN        08/24/21 2229  ondansetron (ZOFRAN ODT) 4 MG disintegrating tablet  Every 8 hours PRN        08/24/21 2229             Sloan Leiter, DO 08/25/21 2023

## 2021-09-24 ENCOUNTER — Other Ambulatory Visit: Payer: Self-pay

## 2021-09-24 ENCOUNTER — Encounter (HOSPITAL_BASED_OUTPATIENT_CLINIC_OR_DEPARTMENT_OTHER): Payer: Self-pay

## 2021-09-24 ENCOUNTER — Emergency Department (HOSPITAL_BASED_OUTPATIENT_CLINIC_OR_DEPARTMENT_OTHER)
Admission: EM | Admit: 2021-09-24 | Discharge: 2021-09-24 | Disposition: A | Discharge status: Home / Self Care | Payer: Self-pay | Attending: Emergency Medicine | Admitting: Emergency Medicine

## 2021-09-24 ENCOUNTER — Emergency Department (HOSPITAL_BASED_OUTPATIENT_CLINIC_OR_DEPARTMENT_OTHER): Payer: Self-pay

## 2021-09-24 DIAGNOSIS — J181 Lobar pneumonia, unspecified organism: Secondary | ICD-10-CM | POA: Insufficient documentation

## 2021-09-24 DIAGNOSIS — J189 Pneumonia, unspecified organism: Secondary | ICD-10-CM

## 2021-09-24 DIAGNOSIS — R0682 Tachypnea, not elsewhere classified: Secondary | ICD-10-CM | POA: Insufficient documentation

## 2021-09-24 DIAGNOSIS — Z7951 Long term (current) use of inhaled steroids: Secondary | ICD-10-CM | POA: Insufficient documentation

## 2021-09-24 DIAGNOSIS — F17210 Nicotine dependence, cigarettes, uncomplicated: Secondary | ICD-10-CM | POA: Insufficient documentation

## 2021-09-24 DIAGNOSIS — J101 Influenza due to other identified influenza virus with other respiratory manifestations: Secondary | ICD-10-CM | POA: Insufficient documentation

## 2021-09-24 DIAGNOSIS — R000 Tachycardia, unspecified: Secondary | ICD-10-CM | POA: Insufficient documentation

## 2021-09-24 DIAGNOSIS — J45901 Unspecified asthma with (acute) exacerbation: Secondary | ICD-10-CM | POA: Insufficient documentation

## 2021-09-24 DIAGNOSIS — R112 Nausea with vomiting, unspecified: Secondary | ICD-10-CM | POA: Insufficient documentation

## 2021-09-24 DIAGNOSIS — Z20822 Contact with and (suspected) exposure to covid-19: Secondary | ICD-10-CM | POA: Insufficient documentation

## 2021-09-24 DIAGNOSIS — Z8616 Personal history of COVID-19: Secondary | ICD-10-CM | POA: Insufficient documentation

## 2021-09-24 LAB — COMPREHENSIVE METABOLIC PANEL
ALT: 9 U/L (ref 0–44)
AST: 12 U/L — ABNORMAL LOW (ref 15–41)
Albumin: 3.6 g/dL (ref 3.5–5.0)
Alkaline Phosphatase: 58 U/L (ref 38–126)
Anion gap: 9 (ref 5–15)
BUN: 8 mg/dL (ref 6–20)
CO2: 25 mmol/L (ref 22–32)
Calcium: 8.5 mg/dL — ABNORMAL LOW (ref 8.9–10.3)
Chloride: 102 mmol/L (ref 98–111)
Creatinine, Ser: 0.83 mg/dL (ref 0.44–1.00)
GFR, Estimated: >60 mL/min (ref >60)
Glucose, Bld: 99 mg/dL (ref 70–99)
Potassium: 3.9 mmol/L (ref 3.5–5.1)
Sodium: 136 mmol/L (ref 135–145)
Total Bilirubin: 0.5 mg/dL (ref 0.3–1.2)
Total Protein: 6.7 g/dL (ref 6.5–8.1)

## 2021-09-24 LAB — CBC WITH DIFFERENTIAL/PLATELET
Abs Immature Granulocytes: 0.03 10*3/uL (ref 0.00–0.07)
Basophils Absolute: 0 10*3/uL (ref 0.0–0.1)
Basophils Relative: 0 %
Eosinophils Absolute: 0.1 10*3/uL (ref 0.0–0.5)
Eosinophils Relative: 1 %
HCT: 37.6 % (ref 36.0–46.0)
Hemoglobin: 11.8 g/dL — ABNORMAL LOW (ref 12.0–15.0)
Immature Granulocytes: 0 %
Lymphocytes Relative: 7 %
Lymphs Abs: 0.6 10*3/uL — ABNORMAL LOW (ref 0.7–4.0)
MCH: 27.3 pg (ref 26.0–34.0)
MCHC: 31.4 g/dL (ref 30.0–36.0)
MCV: 87 fL (ref 80.0–100.0)
Monocytes Absolute: 0.8 10*3/uL (ref 0.1–1.0)
Monocytes Relative: 10 %
Neutro Abs: 6.3 10*3/uL (ref 1.7–7.7)
Neutrophils Relative %: 82 %
Platelets: 337 10*3/uL (ref 150–400)
RBC: 4.32 MIL/uL (ref 3.87–5.11)
RDW: 14.6 % (ref 11.5–15.5)
WBC: 7.8 10*3/uL (ref 4.0–10.5)
nRBC: 0 % (ref 0.0–0.2)

## 2021-09-24 LAB — LIPASE, BLOOD: Lipase: <10 U/L — ABNORMAL LOW (ref 11–51)

## 2021-09-24 LAB — RESP PANEL BY RT-PCR (FLU A&B, COVID) ARPGX2
Influenza A by PCR: POSITIVE — AB
Influenza B by PCR: NEGATIVE
SARS Coronavirus 2 by RT PCR: NEGATIVE

## 2021-09-24 LAB — D-DIMER, QUANTITATIVE: D-Dimer, Quant: 0.48 ug/mL-FEU (ref 0.00–0.50)

## 2021-09-24 MED ORDER — ALBUTEROL SULFATE HFA 108 (90 BASE) MCG/ACT IN AERS: 2.0000 | INHALATION_SPRAY | RESPIRATORY_TRACT | 2 refills | Status: DC | PRN

## 2021-09-24 MED ORDER — ONDANSETRON 4 MG PO TBDP: 4.0000 mg | ORAL_TABLET | Freq: Three times a day (TID) | ORAL | 0 refills | Status: DC | PRN

## 2021-09-24 MED ORDER — IPRATROPIUM-ALBUTEROL 0.5-2.5 (3) MG/3ML IN SOLN
RESPIRATORY_TRACT | Status: AC
  Administered 2021-09-24: 3 mL via RESPIRATORY_TRACT
  Filled 2021-09-24: qty 3

## 2021-09-24 MED ORDER — MOMETASONE FURO-FORMOTEROL FUM 200-5 MCG/ACT IN AERO: 2.0000 | INHALATION_SPRAY | Freq: Two times a day (BID) | RESPIRATORY_TRACT | 2 refills | Status: DC

## 2021-09-24 MED ORDER — IBUPROFEN 800 MG PO TABS
800.0000 mg | ORAL_TABLET | Freq: Once | ORAL | Status: AC
  Administered 2021-09-24: 800 mg via ORAL
  Filled 2021-09-24: qty 1

## 2021-09-24 MED ORDER — ACETAMINOPHEN 500 MG PO TABS
1000.0000 mg | ORAL_TABLET | Freq: Once | ORAL | Status: AC
  Administered 2021-09-24: 1000 mg via ORAL
  Filled 2021-09-24: qty 2

## 2021-09-24 MED ORDER — AZITHROMYCIN 250 MG PO TABS: ORAL_TABLET | ORAL | 0 refills | Status: DC

## 2021-09-24 MED ORDER — IPRATROPIUM-ALBUTEROL 0.5-2.5 (3) MG/3ML IN SOLN
3.0000 mL | Freq: Once | RESPIRATORY_TRACT | Status: AC
  Administered 2021-09-24: 3 mL via RESPIRATORY_TRACT
  Filled 2021-09-24: qty 3

## 2021-09-24 MED ORDER — IPRATROPIUM-ALBUTEROL 0.5-2.5 (3) MG/3ML IN SOLN: 3.0000 mL | Freq: Once | RESPIRATORY_TRACT | Status: AC

## 2021-09-24 NOTE — ED Triage Notes (Signed)
Pt is present chest tightness, SOB, and fever since yesterday. Pt also c/o nausea and vomiting. Nephew in the home tested positive for Flu A a few days ago. Pt sweating and tearful in triage. Pt took tylenol at 1700 but threw it up.

## 2021-09-24 NOTE — ED Notes (Signed)
RT educated pt on importance of maintaining her asthma drug regimen. Pt educated that her maintenance drug [name] was to help w/fewer exacerbation of her asthma symptoms and that it needed to be taken as prescribed and regularly w/out missing doses. Pt also educated on proper admin of her medication for better deposition into the lungs. Pt and family verbalize understanding. RT will continue to monitor.

## 2021-09-24 NOTE — ED Provider Notes (Signed)
[facility] EMERGENCY DEPT Provider Note   CSN: [id number] Arrival date & time: 09/24/21  1953     History Chief Complaint  Patient presents with   Shortness of Breath   Fever    Natalie Ramsey is a 30 y.o. female with a history of asthma.  Presents to the emergency department with a chief complaint of body aches, nausea, vomiting, cough, fever, nausea, vomiting, shortness of breath, and chest congestion.  Patient reports that her symptoms have been present over the last 2 days.  Patient states that cough is producing yellow phlegm.  Patient endorses nausea and vomiting.  States that she has vomited 3 times in the last 24 hours.  Patient describes emesis as stomach contents.  Patient denies any hematemesis or coffee-ground emesis.  Patient shortness of breath became worse today after shopping.  Patient returned home and took nebulizer treatment with no improvement in her symptoms.  Patient reports taking Tylenol at 1700 but quickly threw it up afterwards.  Patient reports that a family whom she lives with tested positive for influenza A a few days ago.  Patient reports that she recently tested positive for COVID-19 on 9/27.  Patient has not received vaccination for influenza.   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 No pertinent surgical history.   OB History     Gravida  0   Para  0   Term  0   Preterm  0   AB  0   Living  0      SAB  0   IAB  0   Ectopic  0   Multiple  0   Live Births  0           No family history on file.  Social History   Tobacco Use   Smoking status: Some Days    Packs/day: 0.50    Types: Cigarettes   Smokeless tobacco: Never  Vaping Use   Vaping Use: Never used  Substance Use Topics   Alcohol use: Not Currently    Comment: occ   Drug use: No    Home Medications Prior to Admission medications   Medication Sig Start Date End Date Taking? Authorizing Provider  acetaminophen (TYLENOL) 325 MG tablet Take 2 tablets (650 mg total) by mouth every 6 (six) hours as needed. 08/24/21   Wynona Dove A, DO  albuterol (PROVENTIL) (2.5 MG/3ML) 0.083% nebulizer solution Take 3 mLs (2.5 mg total) by nebulization every 4 (four) hours as needed for wheezing or shortness of breath. 08/24/21   Jeanell Sparrow, DO  albuterol (VENTOLIN HFA) 108 (90 Base) MCG/ACT  inhaler Inhale 2 puffs into the lungs every 4 (four) hours as needed for wheezing or shortness of breath. 08/24/21 09/23/21  Jeanell Sparrow, DO  buPROPion (WELLBUTRIN SR) 150 MG 12 hr tablet Take 1 tablet (150 mg total) by mouth once daily for 3 days then 2 (two) times daily thereafter. 04/16/21 05/17/21  Elodia Florence., MD  mometasone-formoterol [facility]) 200-5 MCG/ACT AERO Inhale 2 puffs into the lungs 2 (two) times daily. 04/16/21 05/17/21  Elodia Florence., MD  ondansetron (ZOFRAN ODT) 4 MG disintegrating tablet Take 1 tablet (4 mg total) by mouth every 8 (eight) hours as needed for nausea or vomiting. 08/24/21   Jeanell Sparrow, DO  Norethindrone Acetate-Ethinyl Estrad-FE (LOESTRIN 24 FE) 1-20 MG-MCG(24) tablet Take  1 tablet by mouth daily. Patient not taking: Reported on 03/17/2020 12/13/19 12/06/20  Lavonia Drafts, MD    Allergies    Ceftriaxone, Levaquin [levofloxacin in d5w], and Zosyn [piperacillin sod-tazobactam so]  Review of Systems   Review of Systems  Constitutional:  Positive for fever. Negative for chills.  HENT:  Negative for congestion, rhinorrhea, sore throat and trouble swallowing.   Eyes:  Negative for visual disturbance.  Respiratory:  Positive for cough and shortness of breath.   Cardiovascular:  Negative for chest pain, palpitations and leg swelling.  Gastrointestinal:  Positive for nausea and vomiting. Negative for abdominal distention, abdominal pain, anal bleeding, blood in stool, constipation, diarrhea and rectal pain.  Genitourinary:  Negative for difficulty urinating, dysuria, frequency, hematuria and pelvic pain.  Musculoskeletal:  Positive for myalgias. Negative for back pain and neck pain.  Skin:  Negative for color change and rash.  Neurological:  Negative for dizziness, syncope, light-headedness and headaches.  Psychiatric/Behavioral:  Negative for confusion.    Physical Exam Updated Vital Signs BP (!) 108/93   Pulse (!) 113   Temp (!) 103.1 F (39.5 C) (Oral)   Resp 19   Ht '5\' 11"'  (1.803 m)   Wt (!) 195 kg   SpO2 93%   BMI 59.97 kg/m   Physical Exam Vitals and nursing note reviewed.  Constitutional:      General: She is not in acute distress.    Appearance: She is morbidly obese. She is ill-appearing. She is not toxic-appearing or diaphoretic.  HENT:     Head: Normocephalic.  Eyes:     General: No scleral icterus.       Right eye: No discharge.        Left eye: No discharge.  Cardiovascular:     Rate and Rhythm: Tachycardia present.  Pulmonary:     Effort: Pulmonary effort is normal. Tachypnea present. No bradypnea or respiratory distress.     Breath sounds: Examination of the right-upper field reveals wheezing. Examination of the left-upper  field reveals wheezing. Examination of the right-middle field reveals wheezing. Examination of the left-middle field reveals wheezing. Examination of the right-lower field reveals wheezing. Examination of the left-lower field reveals wheezing. Wheezing present.     Comments: Patient has expiratory wheezing noted to all lung fields. Abdominal:     General: There is no distension. There are no signs of injury.     Palpations: Abdomen is soft. There is no mass or pulsatile mass.     Tenderness: There is no guarding or rebound.  Musculoskeletal:     Right lower leg: Normal.     Left lower leg: Normal.  Skin:    General: Skin is warm and dry.  Neurological:  General: No focal deficit present.     Mental Status: She is alert.     GCS: GCS eye subscore is 4. GCS verbal subscore is 5. GCS motor subscore is 6.  Psychiatric:        Behavior: Behavior is cooperative.    ED Results / Procedures / Treatments   Labs (all labs ordered are listed, but only abnormal results are displayed) Labs Reviewed  RESP PANEL BY RT-PCR (FLU A&B, COVID) ARPGX2 - Abnormal; Notable for the following components:      Result Value   Influenza A by PCR POSITIVE (*)    All other components within normal limits  COMPREHENSIVE METABOLIC PANEL - Abnormal; Notable for the following components:   Calcium 8.5 (*)    AST 12 (*)    All other components within normal limits  CBC WITH DIFFERENTIAL/PLATELET - Abnormal; Notable for the following components:   Hemoglobin 11.8 (*)    Lymphs Abs 0.6 (*)    All other components within normal limits  LIPASE, BLOOD - Abnormal; Notable for the following components:   Lipase <10 (*)    All other components within normal limits  D-DIMER, QUANTITATIVE    EKG None  Radiology DG Chest Portable 1 View  Result Date: 09/24/2021 CLINICAL DATA:  Chest tightness, shortness of breath, fever EXAM: PORTABLE CHEST 1 VIEW COMPARISON:  04/15/2021 FINDINGS: Right upper lobe  atelectasis/collapse with volume loss in the right hemithorax. Left lung is clear. No pleural effusion or pneumothorax. The heart is top-normal in size. IMPRESSION: Right upper lobe atelectasis/collapse with volume loss in the right hemithorax. In the acute setting, this is likely related to pneumonia. Electronically Signed   By: Julian Hy M.D.   On: 09/24/2021 21:03    Procedures Procedures   Medications Ordered in ED Medications  ipratropium-albuterol (DUONEB) 0.5-2.5 (3) MG/3ML nebulizer solution 3 mL (has no administration in time range)  ipratropium-albuterol (DUONEB) 0.5-2.5 (3) MG/3ML nebulizer solution (has no administration in time range)  acetaminophen (TYLENOL) tablet 1,000 mg (1,000 mg Oral Given 09/24/21 2054)  ipratropium-albuterol (DUONEB) 0.5-2.5 (3) MG/3ML nebulizer solution 3 mL (3 mLs Nebulization Given 09/24/21 2049)    ED Course  I have reviewed the triage vital signs and the nursing notes.  Pertinent labs & imaging results that were available during my care of the patient were reviewed by me and considered in my medical decision making (see chart for details).    MDM Rules/Calculators/A&P                           Alert 30 year old female in no acute distress, nontoxic appearing.  Patient is ill-appearing.  Presents to the emergency department with chief complaint of flulike symptoms and shortness of breath.  Patient has known sick contact with individual who is positive for influenza A.  Patient has not been vaccinated for influenza this year.  Patient noted to be tachycardic and tachypneic.  Tachycardia is likely secondary to fever.  Will give patient Tylenol and reassess temperature.  Due to shortness of breath and tachycardia will obtain D-dimer to evaluate for possible PE.  Dimer within normal limits, low suspicion for PE at this time.  Repeat temperature notes patient is still febrile.  We will give patient course for further antipyretic.  Patient noted  to have expiratory wheezing in all lung fields.  Patient to receive DuoNeb treatment.  After receiving DuoNeb lungs clear to auscultation in all fields.  X-ray imaging  shows pneumonia.  We will start patient on 5-day course of azithromycin.  Respiratory panel positive for influenza A.  Discussed symptomatic treatment with patient and patient's fianc at bedside.  Lipase within normal limits, low suspicion for acute pancreatitis. AST, ALT, alk phos, total bili all within normal limits, low suspicion for acute hepatobiliary disease at this time.  Will prescribe patient with Zofran for her nausea.  Patient reports being out of her Ruthe Mannan, will represcribe patient this medication.  Patient to follow-up with [place] [facility] center.  Discussed results, findings, treatment and follow up. Patient advised of return precautions. Patient verbalized understanding and agreed with plan.  Patient care and treatment were discussed with attending physician Dr. Gilford Raid.  Final Clinical Impression(s) / ED Diagnoses Final diagnoses:  Influenza A  Community acquired pneumonia, unspecified laterality    Rx / DC Orders ED Discharge Orders          Ordered    ondansetron (ZOFRAN ODT) 4 MG disintegrating tablet  Every 8 hours PRN        09/24/21 2201    albuterol (VENTOLIN HFA) 108 (90 Base) MCG/ACT inhaler  Every 4 hours PRN        09/24/21 2201    mometasone-formoterol (DULERA) 200-5 MCG/ACT AERO  2 times daily        09/24/21 2201    azithromycin (ZITHROMAX Z-PAK) 250 MG tablet        09/24/21 2204             Dyann Ruddle 09/24/21 2350    Isla Pence, MD 09/27/21 1559

## 2021-09-24 NOTE — Discharge Instructions (Addendum)
You were found to be positive for influenza A as well as pneumonia.  Due to your pneumonia you were started on the antibiotic azithromycin.  Please take this medication as prescribed.  I have also given you prescription for albuterol inhaler, Zofran, and a refill of your [facility] medication.  You may have diarrhea from the antibiotics.  It is very important that you continue to take the antibiotics even if you get diarrhea unless a medical professional tells you that you may stop taking them.  If you stop too early the bacteria you are being treated for will become stronger and you may need different, more powerful antibiotics that have more side effects and worsening diarrhea.  Please stay well hydrated and consider probiotics as they may decrease the severity of your diarrhea.  Please be aware that if you take any hormonal contraception (birth control pills, nexplanon, the ring, etc) that your birth control will not work while you are taking antibiotics and you need to use back up protection as directed on the birth control medication information insert.   With the flu your are considered infectious until you are fever free for 24 hours.  You can alternate Tylenol/acetaminophen and Advil/ibuprofen/Motrin every 4 hours for sore throat, body aches, headache or fever.  Do not take more than 3,000 mg tylenol in a 24 hour period.  Please check all medication labels as many medications such as pain and cold medications may contain tylenol.  Do not drink alcohol while taking these medications.  Do not take other NSAID'S while taking ibuprofen (such as aleve or naproxen).  Please take ibuprofen with food to decrease stomach upset.  Please make sure to stay well hydrated.  Drink plenty of water or watered down sports drinks.  If drinking sports drinks please stay away from red colored drinks; as they can cause confusion for bleeding if you vomit.   Please make sure to practice good hand hygiene to help prevent the  spread of flu.  You may use saline nasal spray for congestion.  Can use over-the-counter cough medication to help with your cough.  Follow up with your primary care provider if symptoms persist.  Return to the ER for inability to swallow liquids, difficulty breathing, or new or worsening symptoms.

## 2021-11-10 IMAGING — DX DG CHEST 1V PORT
1 series · 1 of 1 positions shown · non-contrast
Comparison: 03/17/2020

CLINICAL DATA: Cough and fever.  Shortness of breath.

EXAM:
PORTABLE CHEST 1 VIEW

[chest]
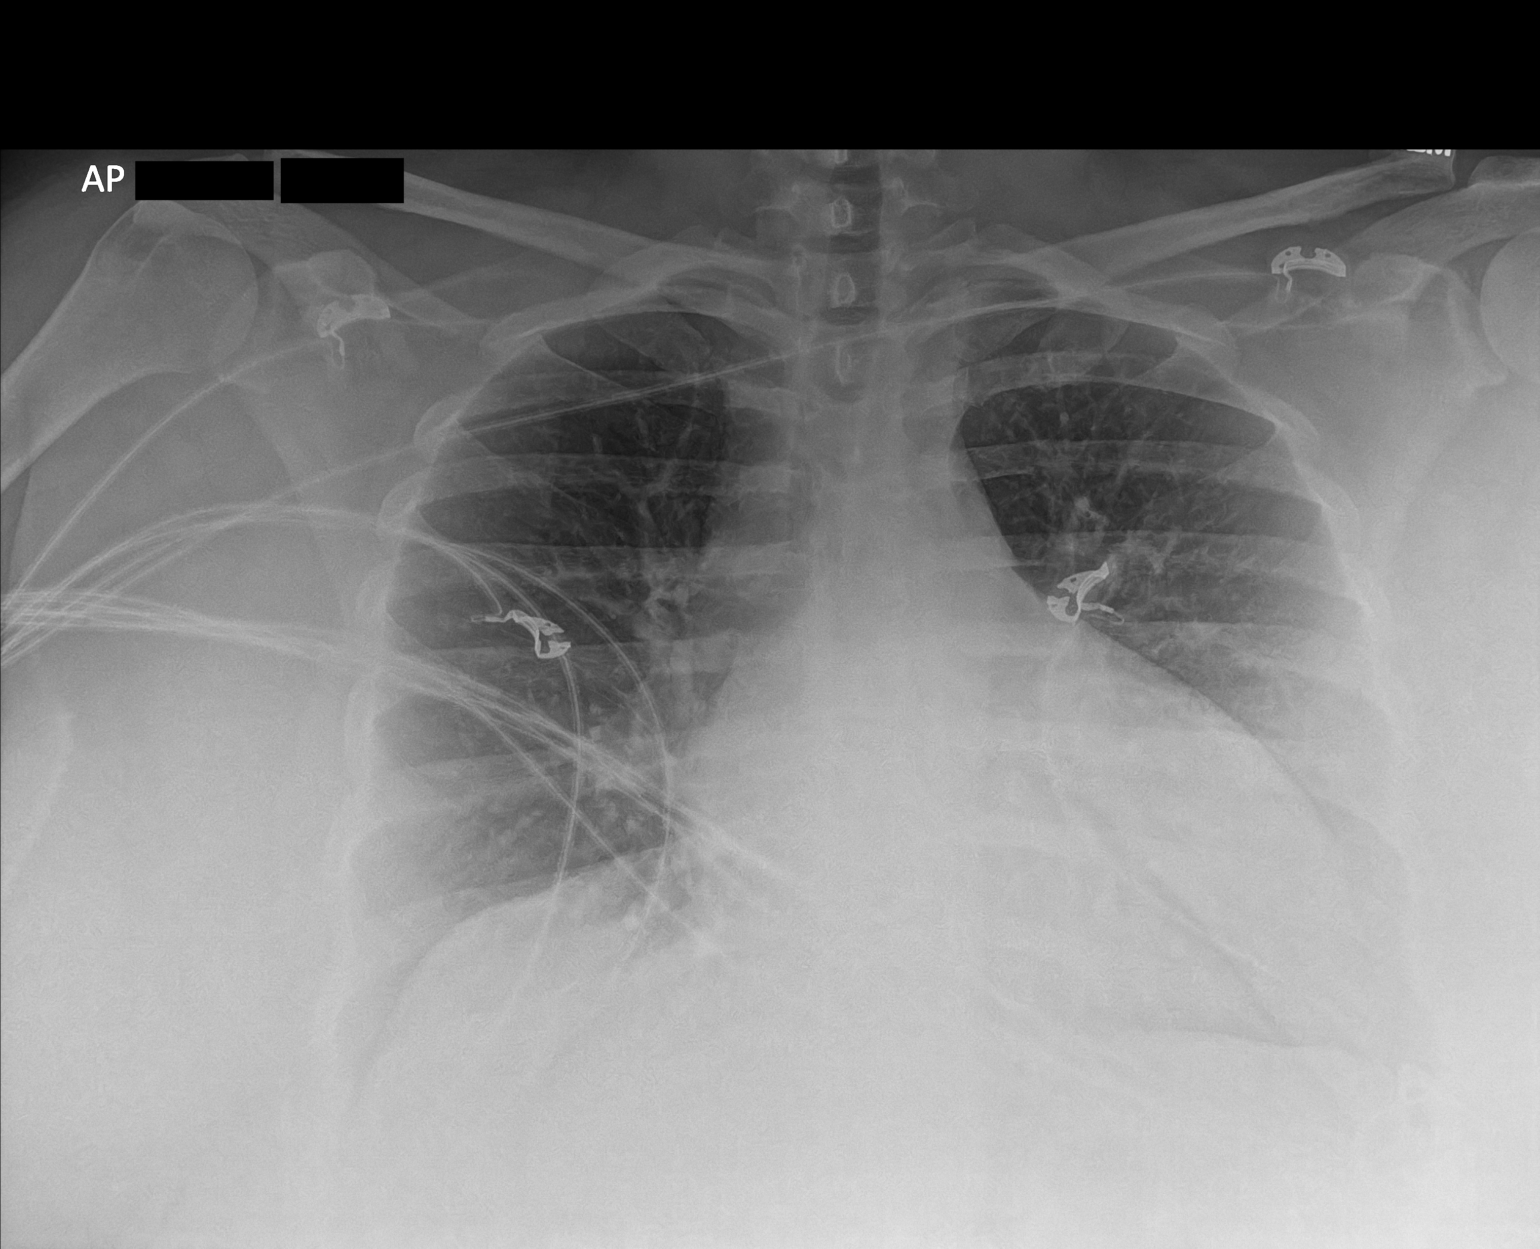

[1 of 1 positions shown; findings below may reference images not displayed]

FINDINGS: 2022 hours. Low lung volumes. Heart size upper normal. The lungs
are clear without focal pneumonia, edema, pneumothorax or pleural
effusion. The visualized bony structures of the thorax show no acute
abnormality. Telemetry leads overlie the chest.
IMPRESSION: Low volume film without acute cardiopulmonary findings.

## 2021-12-13 ENCOUNTER — Ambulatory Visit (HOSPITAL_BASED_OUTPATIENT_CLINIC_OR_DEPARTMENT_OTHER): Payer: Self-pay | Admitting: Family Medicine

## 2021-12-16 ENCOUNTER — Encounter (HOSPITAL_COMMUNITY): Payer: Self-pay

## 2021-12-16 ENCOUNTER — Other Ambulatory Visit: Payer: Self-pay

## 2021-12-16 ENCOUNTER — Ambulatory Visit (HOSPITAL_COMMUNITY)
Admission: EM | Admit: 2021-12-16 | Discharge: 2021-12-16 | Disposition: A | Discharge status: Home / Self Care | Payer: BC Managed Care – PPO | Attending: Emergency Medicine | Admitting: Emergency Medicine

## 2021-12-16 DIAGNOSIS — I10 Essential (primary) hypertension: Secondary | ICD-10-CM | POA: Diagnosis not present

## 2021-12-16 DIAGNOSIS — J45909 Unspecified asthma, uncomplicated: Secondary | ICD-10-CM | POA: Diagnosis not present

## 2021-12-16 MED ORDER — MOMETASONE FURO-FORMOTEROL FUM 200-5 MCG/ACT IN AERO: 2.0000 | INHALATION_SPRAY | Freq: Two times a day (BID) | RESPIRATORY_TRACT | 2 refills | Status: DC

## 2021-12-16 MED ORDER — ALBUTEROL SULFATE (2.5 MG/3ML) 0.083% IN NEBU: 2.5000 mg | INHALATION_SOLUTION | RESPIRATORY_TRACT | 2 refills | Status: DC | PRN

## 2021-12-16 MED ORDER — ALBUTEROL SULFATE HFA 108 (90 BASE) MCG/ACT IN AERS: 2.0000 | INHALATION_SPRAY | RESPIRATORY_TRACT | 2 refills | Status: DC | PRN

## 2021-12-16 NOTE — Discharge Instructions (Signed)
Use the mometasone-formoterol EVERY day, twice a day, even if your breathing is fine.  Continue working on exercising and weight loss.  Keep up the good work!  Now is a great time to stop smoking.  Follow-up with Enid Skeens 01/14/2022 as scheduled.

## 2021-12-16 NOTE — ED Provider Notes (Signed)
[facility]    CSN: [id number] Arrival date & time: 12/16/21  1553      History   Chief Complaint Chief Complaint  Patient presents with   Medication Refill    HPI Natalie Ramsey is a 31 y.o. female.  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  Has appointment 01/14/2022 with a new PCP here in [place].  Is out of albuterol inhaler, nebulizer and out of Dulera.  Is requesting refills before she leaves town for a 4-week trip for work.   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 No pertinent surgical history.  OB History     Gravida  0   Para  0   Term  0   Preterm  0   AB  0   Living  0      SAB  0   IAB  0   Ectopic  0   Multiple  0   Live Births  0            Home Medications    Prior to Admission medications   Medication Sig Start Date End Date Taking? Authorizing Provider  acetaminophen (TYLENOL) 325 MG tablet Take 2 tablets (650 mg total) by mouth every 6 (six) hours as needed. 08/24/21   Wynona Dove A, DO  albuterol (PROVENTIL) (2.5 MG/3ML) 0.083% nebulizer solution Take 3 mLs (2.5 mg total) by nebulization every 4 (four) hours as needed for wheezing or shortness of breath. 12/16/21   Carvel Getting, NP  albuterol (VENTOLIN HFA) 108 (90 Base) MCG/ACT inhaler Inhale 2 puffs into the lungs every 4 (four) hours as needed for wheezing or shortness of breath. 12/16/21 01/15/22  Carvel Getting, NP  azithromycin (ZITHROMAX Z-PAK) 250 MG tablet Take 2 pills on day 1 then take 1  pill by mouth once daily for the next 4 days. 09/24/21   Loni Beckwith, PA-C  buPROPion (WELLBUTRIN SR) 150 MG 12 hr tablet Take 1 tablet (150 mg total) by mouth once daily for 3 days then 2 (two) times daily thereafter. 04/16/21 05/17/21  Elodia Florence., MD  mometasone-formoterol [facility]) 200-5 MCG/ACT AERO Inhale 2 puffs into the lungs 2 (two) times daily. 12/16/21 01/15/22  Carvel Getting, NP  ondansetron (ZOFRAN ODT) 4 MG disintegrating tablet Take 1 tablet (4 mg total) by mouth every 8 (eight) hours as needed for nausea or vomiting. 09/24/21   Loni Beckwith, PA-C  Norethindrone Acetate-Ethinyl Estrad-FE (LOESTRIN 24 FE) 1-20 MG-MCG(24) tablet Take 1 tablet by mouth daily. Patient not taking: Reported on 03/17/2020 12/13/19 12/06/20  Lavonia Drafts, MD    Family History History reviewed. No pertinent family history.  Social History Social History   Tobacco Use   Smoking status: Some Days    Packs/day: 0.50    Types: Cigarettes   Smokeless tobacco: Never  Vaping Use   Vaping Use: Never used  Substance Use Topics   Alcohol use: Not Currently    Comment: occ  Drug use: No     Allergies   Ceftriaxone, Levaquin [levofloxacin in d5w], and Zosyn [piperacillin sod-tazobactam so]   Review of Systems Review of Systems  Respiratory:  Positive for wheezing. Negative for chest tightness and shortness of breath.   Cardiovascular:  Negative for chest pain.    Physical Exam Triage Vital Signs ED Triage Vitals  Enc Vitals Group     BP 12/16/21 1637 (!) 161/85     Pulse Rate 12/16/21 1637 75     Resp 12/16/21 1637 16     Temp 12/16/21 1637 98.3 F (36.8 C)     Temp Source 12/16/21 1637 Oral     SpO2 12/16/21 1637 98 %     Weight --      Height --      Head Circumference --      Peak Flow --      Pain Score 12/16/21 1638 0     Pain Loc --      Pain Edu? --      Excl. in [place]? --    No data found.  Updated Vital Signs BP (!) 161/85 (BP Location:  Left Arm)    Pulse 75    Temp 98.3 F (36.8 C) (Oral)    Resp 16    SpO2 98%   Visual Acuity Right Eye Distance:   Left Eye Distance:   Bilateral Distance:    Right Eye Near:   Left Eye Near:    Bilateral Near:     Physical Exam Cardiovascular:     Rate and Rhythm: Normal rate and regular rhythm.  Pulmonary:     Effort: Pulmonary effort is normal.     Breath sounds: Wheezing present.     UC Treatments / Results  Labs (all labs ordered are listed, but only abnormal results are displayed) Labs Reviewed - No data to display  EKG   Radiology No results found.  Procedures Procedures (including critical care time)  Medications Ordered in UC Medications - No data to display  Initial Impression / Assessment and Plan / UC Course  I have reviewed the triage vital signs and the nursing notes.  Pertinent labs & imaging results that were available during my care of the patient were reviewed by me and considered in my medical decision making (see chart for details).    Patient with mild diffuse wheezing.  No cough.  Albuterol treatment here, patient prefers to treat herself at home once her albuterol nebulizer and inhaler are refilled.  Discussed use of Dulera to prevent asthma exacerbations.  Patient is a smoker, discussed smoking cessation.  Patient's blood pressure is elevated today, discussed her monitoring it at home.  Patient has lost 37 pounds recently by increasing activity and exercise.  Final Clinical Impressions(s) / UC Diagnoses   Final diagnoses:  Hypertension, unspecified type  Asthma, unspecified asthma severity, unspecified whether complicated, unspecified whether persistent     Discharge Instructions      Use the mometasone-formoterol EVERY day, twice a day, even if your breathing is fine.  Continue working on exercising and weight loss.  Keep up the good work!  Now is a great time to stop smoking.  Follow-up with Natalie Ramsey 01/14/2022 as  scheduled.   ED Prescriptions     Medication Sig Dispense Auth. Provider   mometasone-formoterol (DULERA) 200-5 MCG/ACT AERO Inhale 2 puffs into the lungs 2 (two) times daily. 13 g Carvel Getting, NP   albuterol (PROVENTIL) (2.5 MG/3ML) 0.083% nebulizer  solution Take 3 mLs (2.5 mg total) by nebulization every 4 (four) hours as needed for wheezing or shortness of breath. 75 mL Carvel Getting, NP   albuterol (VENTOLIN HFA) 108 (90 Base) MCG/ACT inhaler Inhale 2 puffs into the lungs every 4 (four) hours as needed for wheezing or shortness of breath. 18 g Carvel Getting, NP      PDMP not reviewed this encounter.   Carvel Getting, NP 12/16/21 1714

## 2021-12-16 NOTE — ED Triage Notes (Signed)
Pt is here for a refill on her inhaler Albuterol and Dulera inhaler. 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

## 2022-01-14 ENCOUNTER — Ambulatory Visit (HOSPITAL_BASED_OUTPATIENT_CLINIC_OR_DEPARTMENT_OTHER): Payer: Self-pay | Admitting: Nurse Practitioner

## 2022-02-11 ENCOUNTER — Encounter (HOSPITAL_BASED_OUTPATIENT_CLINIC_OR_DEPARTMENT_OTHER): Payer: Self-pay | Admitting: Nurse Practitioner

## 2022-02-11 ENCOUNTER — Other Ambulatory Visit: Payer: Self-pay

## 2022-02-11 ENCOUNTER — Ambulatory Visit (INDEPENDENT_AMBULATORY_CARE_PROVIDER_SITE_OTHER): Payer: BC Managed Care – PPO | Admitting: Nurse Practitioner

## 2022-02-11 VITALS — BP 130/88 | HR 73 | Ht 71.0 in | Wt >= 6400 oz

## 2022-02-11 DIAGNOSIS — E282 Polycystic ovarian syndrome: Secondary | ICD-10-CM | POA: Diagnosis not present

## 2022-02-11 DIAGNOSIS — N9489 Other specified conditions associated with female genital organs and menstrual cycle: Secondary | ICD-10-CM | POA: Insufficient documentation

## 2022-02-11 DIAGNOSIS — B372 Candidiasis of skin and nail: Secondary | ICD-10-CM

## 2022-02-11 DIAGNOSIS — K5900 Constipation, unspecified: Secondary | ICD-10-CM

## 2022-02-11 DIAGNOSIS — I10 Essential (primary) hypertension: Secondary | ICD-10-CM

## 2022-02-11 DIAGNOSIS — R140 Abdominal distension (gaseous): Secondary | ICD-10-CM | POA: Diagnosis not present

## 2022-02-11 MED ORDER — NYSTATIN 100000 UNIT/GM EX POWD: 1.0000 "application " | Freq: Three times a day (TID) | CUTANEOUS | 3 refills | Status: DC

## 2022-02-11 MED ORDER — SPIRONOLACTONE 25 MG PO TABS: 25.0000 mg | ORAL_TABLET | Freq: Every day | ORAL | 3 refills | Status: DC

## 2022-02-11 MED ORDER — ORTHO-NOVUM 1/35 (28) 1-35 MG-MCG PO TABS: 1.0000 | ORAL_TABLET | Freq: Every day | ORAL | 11 refills | Status: DC

## 2022-02-11 MED ORDER — VALSARTAN 40 MG PO TABS: 40.0000 mg | ORAL_TABLET | Freq: Every day | ORAL | 3 refills | Status: DC

## 2022-02-11 MED ORDER — FLUCONAZOLE 150 MG PO TABS: ORAL_TABLET | ORAL | 2 refills | Status: DC

## 2022-02-11 NOTE — Progress Notes (Signed)
?Orma Render, DNP, AGNP-c ?Primary Care & Sports Medicine ?[place], [place] [address] ?[phone] ? ?New patient visit ? ? ?Patient: Natalie Ramsey   DOB: 04/20/1991   31 y.o. Female  MRN: [id number] ?Visit Date: 02/11/2022 ? ?Patient Care Team: ?Johnella Crumm, Coralee Pesa, NP as PCP - General (Nurse Practitioner) ? ?Today's healthcare provider: Orma Render, NP  ? ?Chief Complaint  ?Patient presents with  ? New Patient (Initial Visit)  ?  Patient presents today to establish care. 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 She is currently on c-pap machine. Discuss irregular menstrual cycle. Would like to schedule a PAP in the future.  ? ?  ? ?Subjective  ?  ?Natalie Ramsey is a 31 y.o. female who presents today as a new patient to establish care.  ?  ?Patient endorses the following concerns presently: ?BP ?- elevated at DOT physical in December ?- found to have sleep apnea and was started on CPAP ?- she is tolerating the CPAP well  ?- no HA, CP, palpitations ? ?Irregular Menses ?- in [place] 20's missed cycle for about a year ?- when came back was very heavy ?- she will miss 3-4 months at a time and then it will come ?- when she does have her menstrual cycle it is very heavy and will last for 5 months or longer ?- birth control in the past that somewhat helped regulate it, but she did have spotting ? 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 ?History reviewed and reveals the following: ?Past Medical History:  ?Diagnosis Date  ? Asthma   ? Asthma   ? Pyelonephritis   ? ?History reviewed. No pertinent surgical history. ?Family Status  ?Relation Name Status  ? Mother  Alive  ? Father  Deceased  ? ?History reviewed. No pertinent family history. ?Social History  ? ?Socioeconomic History  ? Marital status: Significant Other  ?  Spouse name: Not on file  ? Number of children: Not on file  ? Years of  education: Not on file  ? Highest education level: Not on file  ?Occupational History  ? Not on file  ?Tobacco Use  ? Smoking status: Some Days  ?  Packs/day: 0.50  ?  Types: Cigarettes  ? Smokeless tobacco: Never  ?Vaping Use  ? Vaping Use: Never used  ?Substance and Sexual Activity  ? Alcohol use: Not Currently  ?  Comment: occ  ? Drug use: No  ? Sexual activity: Yes  ?  Birth control/protection: None  ?Other Topics Concern  ? Not on file  ?Social History Narrative  ? Not on file  ? ?Social Determinants of Health  ? ?Financial Resource Strain: Not on file  ?Food Insecurity: Not on file  ?Transportation Needs: Not on file  ?Physical Activity: Not on file  ?Stress: Not on file  ?Social Connections: Not on file  ? ?Outpatient Medications Prior to Visit  ?Medication Sig  ? acetaminophen (TYLENOL) 325 MG tablet Take 2 tablets (650 mg total) by mouth every 6 (six) hours as needed.  ? albuterol (PROVENTIL) (2.5 MG/3ML) 0.083% nebulizer solution Take 3 mLs (2.5 mg total) by nebulization every 4 (four) hours as needed for wheezing or shortness of breath.  ? albuterol (VENTOLIN HFA) 108 (90 Base) MCG/ACT inhaler Inhale 2 puffs into the lungs every 4 (four) hours as needed  for wheezing or shortness of breath.  ? buPROPion (WELLBUTRIN SR) 150 MG 12 hr tablet Take 1 tablet (150 mg total) by mouth once daily for 3 days then 2 (two) times daily thereafter.  ? mometasone-formoterol (DULERA) 200-5 MCG/ACT AERO Inhale 2 puffs into the lungs 2 (two) times daily.  ? ondansetron (ZOFRAN ODT) 4 MG disintegrating tablet Take 1 tablet (4 mg total) by mouth every 8 (eight) hours as needed for nausea or vomiting.  ? [DISCONTINUED] azithromycin (ZITHROMAX Z-PAK) 250 MG tablet Take 2 pills on day 1 then take 1 pill by mouth once daily for the next 4 days.  ? ?No facility-administered medications prior to visit.  ? ?Allergies  ?Allergen Reactions  ? Ceftriaxone   ?  Patient developed exfoliative dermatitis after taking 3 antibiotics,  including Levaquin, ceftriaxone and Zosyn. Not very sure which one caused this problem.  ? Levaquin [Levofloxacin In D5w]   ?  Patient developed exfoliative dermatitis after taking 3 antibiotics, including Levaquin, ceftriaxone and Zosyn. Not very sure which one caused this problem.  ? Zosyn [Piperacillin Sod-Tazobactam So]   ?  Patient developed exfoliative dermatitis after taking 3 antibiotics, including Levaquin, ceftriaxone and Zosyn. Not very sure which one caused this problem.  ? ?There is no immunization history for the selected administration types on file for this patient. ? ?Health Maintenance Due: ?Health Maintenance  ?Topic Date Due  ? COVID-19 Vaccine (1) Never done  ? Hepatitis C Screening  Never done  ? TETANUS/TDAP  Never done  ? INFLUENZA VACCINE  02/25/2022 (Originally 06/28/2021)  ? PAP SMEAR-Modifier  12/12/2022  ? HIV Screening  Completed  ? HPV VACCINES  Aged Out  ? ? ?Review of Systems ?All review of systems negative except what is listed in the HPI ? ? Objective  ?  ?BP 130/88   Pulse 73   Ht 5\' 11"  (1.803 m)   Wt (!) 413 lb (187.3 kg)   SpO2 98%   BMI 57.60 kg/m?  ?Physical Exam ?Vitals and nursing note reviewed.  ?Constitutional:   ?   General: She is not in acute distress. ?   Appearance: Normal appearance.  ?Eyes:  ?   Extraocular Movements: Extraocular movements intact.  ?   Conjunctiva/sclera: Conjunctivae normal.  ?   Pupils: Pupils are equal, round, and reactive to light.  ?Neck:  ?   Vascular: No carotid bruit.  ?Cardiovascular:  ?   Rate and Rhythm: Normal rate and regular rhythm.  ?   Pulses: Normal pulses.  ?   Heart sounds: Normal heart sounds. No murmur heard. ?Pulmonary:  ?   Effort: Pulmonary effort is normal.  ?   Breath sounds: Normal breath sounds. No wheezing.  ?Abdominal:  ?   General: Bowel sounds are normal.  ?   Palpations: Abdomen is soft.  ?Musculoskeletal:     ?   General: Normal range of motion.  ?   Cervical back: Normal range of motion.  ?   Right lower leg:  No edema.  ?   Left lower leg: No edema.  ?Skin: ?   General: Skin is warm and dry.  ?   Capillary Refill: Capillary refill takes less than 2 seconds.  ?   Findings: Rash present.  ?   Comments: Erythematous coalescent rash in bilaterally axilla and under breasts with yeast like odor present consistent with candidal infection to the skin.   ?Neurological:  ?   General: No focal deficit present.  ?   Mental  Status: She is alert and oriented to person, place, and time.  ?Psychiatric:     ?   Mood and Affect: Mood normal.     ?   Behavior: Behavior normal.     ?   Thought Content: Thought content normal.     ?   Judgment: Judgment normal.  ? ? ?No results found for any visits on 02/11/22. ? Assessment & Plan   ?  ? ?Problem List Items Addressed This Visit   ? ? RESOLVED: Constipation  ? PCOS (polycystic ovarian syndrome) - Primary  ?  Positive qualifiers for PCOS include ovulatory dysfunction, hirsutism, and acne. Will measure labs today. Dx of OSA present. Will monitor for DM and dyslipidemia.  ?Will make changes to plan of care based on findings.  ?Recommend spironolactone for symptoms and OCP for menstrual regulation. Recommend smoking cessation. F/U based on labs ?  ?  ? Relevant Medications  ? spironolactone (ALDACTONE) 25 MG tablet  ? norethindrone-ethinyl estradiol 1/35 ([place] 1/35, 28,) tablet  ? Other Relevant Orders  ? CBC with Differential/Platelet  ? Comprehensive metabolic panel  ? Hemoglobin A1c  ? VITAMIN D 25 Hydroxy (Vit-D Deficiency, Fractures)  ? TSH  ? T4  ? T3  ? LDL cholesterol, direct  ? 17-Hydroxyprogesterone  ? Testosterone, Free, Total, SHBG  ? FSH/LH  ? Abdominal bloating associated with menstruation  ?  Abdominal bloating and distention with menstruation with irregular menses.  Symptoms are consistent with PCOS.  We will plan to start on OCP and obtain labs today.  May consider transvaginal ultrasound for further evaluation of bloating. ? ?  ?  ? Relevant Orders  ? Hemoglobin A1c  ?  TSH  ? T4  ? T3  ? 17-Hydroxyprogesterone  ? Testosterone, Free, Total, SHBG  ? FSH/LH  ? Hypertension  ?  Chronic. Controlled today. No alarm symptoms.  ?In setting of morbid obesity, recommend tight control of HTN to reduc

## 2022-02-11 NOTE — Patient Instructions (Addendum)
Thank you for choosing [facility] at [facility] for your Primary Care needs. I am excited for the opportunity to partner with you to meet your health care goals. It was a pleasure meeting you today! ? ?Recommendations from today's visit: ?PCOS ?We will get some labs today to make sure that everything looks ok. I will be in touch with you once the labs are back and I have reviewed them.  ?I have sent in the birth control for you and spironolactone. Take both of these daily and take them around the same time to keep from spotting.  ?If your period does not regulate, let me know ?Blood Pressure ?I have sent in a medication called valsartan for your blood pressure.  ?Take this medication once a day for it to work best.  ?Yeast ?I have sent the diflucan in for you to take every 3 days for 12 days total and the powder to use every day ?Constipation ?Try increasing your water intake ?You should have at least 64 ounces of water every day for your body to work like it should ?You can try a stool softener, like Colace, to help also.  ?If this does not improve with more water, let me know  ? ?Information on diet, exercise, and health maintenance recommendations are listed below. This is information to help you be sure you are on track for optimal health and monitoring.  ? ?Please look over this and let us know if you have any questions or if you have completed any of the health maintenance outside of [facility] so that we can be sure your records are up to date.  ?___________________________________________________________ ?About Me: ?I am an Adult-Geriatric Nurse Practitioner with a background in caring for patients for more than 20 years with a strong intensive care background. I provide primary care and sports medicine services to patients age 64 and older within this office. My education had a strong focus on caring for the older adult population, which I am passionate about. I am also the director  of the APP Fellowship with [facility].  ? ?My desire is to provide you with the best service through preventive medicine and supportive care. I consider you a part of the medical team and value your input. I work diligently to ensure that you are heard and your needs are met in a safe and effective manner. I want you to feel comfortable with me as your provider and want you to know that your health concerns are important to me. ? ?For your information, our office hours are: ?Monday, Tuesday, and Thursday 8:00 AM - 5:00 PM ?Wednesday and Friday 8:00 AM - 12:00 PM.  ? ?In my time away from the office I am teaching new APP's within the system and am unavailable, but my partner, Dr. Burnard Bunting is in the office for emergent needs.  ? ?If you have questions or concerns, please call our office at [phone] or send [place] a MyChart message and we will respond as quickly as possible.  ?____________________________________________________________ ?MyChart:  ?For all urgent or time sensitive needs we ask that you please call the office to avoid delays. Our number is [phone]. ?MyChart is not constantly monitored and due to the large volume of messages a day, replies may take up to 72 business hours. ? ?MyChart Policy: ?MyChart allows for you to see your visit notes, after visit summary, provider recommendations, lab and tests results, make an appointment, request refills, and contact your provider or  the office for non-urgent questions or concerns. Providers are seeing patients during normal business hours and do not have built in time to review MyChart messages.  ?We ask that you allow a minimum of 3 business days for responses to [employer]. For this reason, please do not send urgent requests through [place]. Please call the office at [phone]. ?New and ongoing conditions may require a visit. We have virtual and in person visit available for your convenience.  ?Complex MyChart concerns may require a visit. Your  provider may request you schedule a virtual or in person visit to ensure we are providing the best care possible. ?MyChart messages sent after 11:00 AM on Friday will not be received by the provider until Monday morning.  ?  ?Lab and Test Results: ?You will receive your lab and test results on MyChart as soon as they are completed and results have been sent by the lab or testing facility. Due to this service, you will receive your results BEFORE your provider.  ?I review lab and tests results each morning prior to seeing patients. Some results require collaboration with other providers to ensure you are receiving the most appropriate care. For this reason, we ask that you please allow a minimum of 3-5 business days from the time the ALL results have been received for your provider to receive and review lab and test results and contact you about these.  ?Most lab and test result comments from the provider will be sent through [place]. Your provider may recommend changes to the plan of care, follow-up visits, repeat testing, ask questions, or request an office visit to discuss these results. You may reply directly to this message or call the office at [phone] to provide information for the provider or set up an appointment. ?In some instances, you will be called with test results and recommendations. Please let us know if this is preferred and we will make note of this in your chart to provide this for you.    ?If you have not heard a response to your lab or test results in 5 business days from all results returning to [place], please call the office to let us know. We ask that you please avoid calling prior to this time unless there is an emergent concern. Due to high call volumes, this can delay the resulting process. ? ?After Hours: ?For all non-emergency after hours needs, please call the office at [phone] and select the option to reach the on-call provider service. On-call services are shared between  multiple [facility] offices and therefore it will not be possible to speak directly with your provider. On-call providers may provide medical advice and recommendations, but are unable to provide refills for maintenance medications.  ?For all emergency or urgent medical needs after normal business hours, we recommend that you seek care at the closest Urgent Care or Emergency Department to ensure appropriate treatment in a timely manner.  ?[facility] has a 24 hour emergency room located on the ground floor for your convenience.  ? ?Urgent Concerns During the Business Day ?Providers are seeing patients from 8AM to [place] with a busy schedule and are most often not able to respond to non-urgent calls until the end of the day or the next business day. ?If you should have URGENT concerns during the day, please call and speak to the nurse or schedule a same day appointment so that we can address your concern without delay.  ? ?Thank you, again, for  choosing me as your health care partner. I appreciate your trust and look forward to learning more about you.  ? ?Worthy Keeler, DNP, AGNP-c ?___________________________________________________________ ? ?Health Maintenance Recommendations ?Screening Testing ?Mammogram ?Every 1 -2 years based on history and risk factors ?Starting at age 47 ?Pap Smear ?Ages 21-39 every 3 years ?Ages 54-65 every 5 years with HPV testing ?More frequent testing may be required based on results and history ?Colon Cancer Screening ?Every 1-10 years based on test performed, risk factors, and history ?Starting at age 29 ?Bone Density Screening ?Every 2-10 years based on history ?Starting at age 23 for women ?Recommendations for men differ based on medication usage, history, and risk factors ?AAA Screening ?One time ultrasound ?Men 32-18 years old who have every smoked ?Lung Cancer Screening ?Low Dose Lung CT every 12 months ?Age 68-80 years with a 30 pack-year smoking history who  still smoke or who have quit within the last 15 years ? ?Screening Labs ?Routine  Labs: Complete Blood Count (CBC), Complete Metabolic Panel (CMP), Cholesterol (Lipid Panel) ?Every 6-12 months based on h

## 2022-02-17 NOTE — Assessment & Plan Note (Signed)
Symptoms and presentation consistent with candidal infection of the flexural surfaces of the skin. ?Recommend oral Diflucan with multiple treatments to help eradicate infection and use of topical nystatin powder to help with further reduction and prevention. ?Recommend avoidance of tight fitting clothing and changing moist clothing quickly.  Will monitor for insulin resistance today given patient's history of obesity and new diagnosis of PCOS this is likely a causative factor. ?We will follow-up based on lab results. ?

## 2022-02-17 NOTE — Assessment & Plan Note (Signed)
Positive qualifiers for PCOS include ovulatory dysfunction, hirsutism, and acne. Will measure labs today. Dx of OSA present. Will monitor for DM and dyslipidemia.  ?Will make changes to plan of care based on findings.  ?Recommend spironolactone for symptoms and OCP for menstrual regulation. Recommend smoking cessation. F/U based on labs ?

## 2022-02-17 NOTE — Assessment & Plan Note (Signed)
Abdominal bloating and distention with menstruation with irregular menses.  Symptoms are consistent with PCOS.  We will plan to start on OCP and obtain labs today.  May consider transvaginal ultrasound for further evaluation of bloating. ? ?

## 2022-02-17 NOTE — Assessment & Plan Note (Signed)
Chronic. Controlled today. No alarm symptoms.  ?In setting of morbid obesity, recommend tight control of HTN to reduce CV risks. Will plan to obtain labs today and monitor closely. Recommend at home BP cuff for daily monitoring with goal BP >130/80.  ?Management of OSA will likely help improve control. Recommend compliance with CPAP and follow-up evaluations.  ?

## 2022-02-23 LAB — COMPREHENSIVE METABOLIC PANEL
ALT: 13 IU/L (ref 0–32)
AST: 15 IU/L (ref 0–40)
Albumin/Globulin Ratio: 1.2 (ref 1.2–2.2)
Albumin: 3.6 g/dL — ABNORMAL LOW (ref 3.8–4.8)
Alkaline Phosphatase: 85 IU/L (ref 44–121)
BUN/Creatinine Ratio: 17 (ref 9–23)
BUN: 14 mg/dL (ref 6–20)
Bilirubin Total: 0.4 mg/dL (ref 0.0–1.2)
CO2: 26 mmol/L (ref 20–29)
Calcium: 9.3 mg/dL (ref 8.7–10.2)
Chloride: 102 mmol/L (ref 96–106)
Creatinine, Ser: 0.82 mg/dL (ref 0.57–1.00)
Globulin, Total: 3.1 g/dL (ref 1.5–4.5)
Glucose: 77 mg/dL (ref 70–99)
Potassium: 4.9 mmol/L (ref 3.5–5.2)
Sodium: 141 mmol/L (ref 134–144)
Total Protein: 6.7 g/dL (ref 6.0–8.5)
eGFR: 98 mL/min/{1.73_m2} (ref >59)

## 2022-02-23 LAB — TESTOSTERONE, FREE, TOTAL, SHBG
Sex Hormone Binding: 36.5 nmol/L (ref 24.6–122.0)
Testosterone, Free: 3.6 pg/mL (ref 0.0–4.2)
Testosterone: 50 ng/dL (ref 8–60)

## 2022-02-23 LAB — T3: T3, Total: 98 ng/dL (ref 71–180)

## 2022-02-23 LAB — T4: T4, Total: 5.8 ug/dL (ref 4.5–12.0)

## 2022-02-23 LAB — TSH: TSH: 2.81 u[IU]/mL (ref 0.450–4.500)

## 2022-02-23 LAB — CBC WITH DIFFERENTIAL/PLATELET
Basophils Absolute: 0 10*3/uL (ref 0.0–0.2)
Basos: 1 %
EOS (ABSOLUTE): 0.2 10*3/uL (ref 0.0–0.4)
Eos: 3 %
Hematocrit: 37.9 % (ref 34.0–46.6)
Hemoglobin: 12.2 g/dL (ref 11.1–15.9)
Immature Grans (Abs): 0 10*3/uL (ref 0.0–0.1)
Immature Granulocytes: 0 %
Lymphocytes Absolute: 1.8 10*3/uL (ref 0.7–3.1)
Lymphs: 25 %
MCH: 27.2 pg (ref 26.6–33.0)
MCHC: 32.2 g/dL (ref 31.5–35.7)
MCV: 85 fL (ref 79–97)
Monocytes Absolute: 0.6 10*3/uL (ref 0.1–0.9)
Monocytes: 8 %
Neutrophils Absolute: 4.7 10*3/uL (ref 1.4–7.0)
Neutrophils: 63 %
Platelets: 362 10*3/uL (ref 150–450)
RBC: 4.48 x10E6/uL (ref 3.77–5.28)
RDW: 13.9 % (ref 11.7–15.4)
WBC: 7.3 10*3/uL (ref 3.4–10.8)

## 2022-02-23 LAB — HEMOGLOBIN A1C
Est. average glucose Bld gHb Est-mCnc: 120 mg/dL
Hgb A1c MFr Bld: 5.8 % — ABNORMAL HIGH (ref 4.8–5.6)

## 2022-02-23 LAB — VITAMIN D 25 HYDROXY (VIT D DEFICIENCY, FRACTURES): Vit D, 25-Hydroxy: 11 ng/mL — ABNORMAL LOW (ref 30.0–100.0)

## 2022-02-23 LAB — FSH/LH
FSH: 5.2 m[IU]/mL
LH: 6.3 m[IU]/mL

## 2022-02-23 LAB — LDL CHOLESTEROL, DIRECT: LDL Direct: 101 mg/dL — ABNORMAL HIGH (ref 0–99)

## 2022-02-23 LAB — 17-HYDROXYPROGESTERONE: 17-OH Progesterone LCMS: <10 ng/dL

## 2022-02-25 ENCOUNTER — Encounter (HOSPITAL_BASED_OUTPATIENT_CLINIC_OR_DEPARTMENT_OTHER): Payer: Self-pay

## 2022-03-01 ENCOUNTER — Telehealth (HOSPITAL_BASED_OUTPATIENT_CLINIC_OR_DEPARTMENT_OTHER): Payer: BC Managed Care – PPO | Admitting: Nurse Practitioner

## 2022-03-02 ENCOUNTER — Ambulatory Visit (INDEPENDENT_AMBULATORY_CARE_PROVIDER_SITE_OTHER): Payer: Self-pay | Admitting: Nurse Practitioner

## 2022-03-10 ENCOUNTER — Encounter (HOSPITAL_BASED_OUTPATIENT_CLINIC_OR_DEPARTMENT_OTHER): Payer: Self-pay | Admitting: Nurse Practitioner

## 2022-04-03 NOTE — Progress Notes (Signed)
No show

## 2022-04-21 IMAGING — DX DG CHEST 1V PORT
1 series · 1 of 1 positions shown · non-contrast
Comparison: 04/15/2021

CLINICAL DATA: Chest tightness, shortness of breath, fever

EXAM:
PORTABLE CHEST 1 VIEW

[chest]
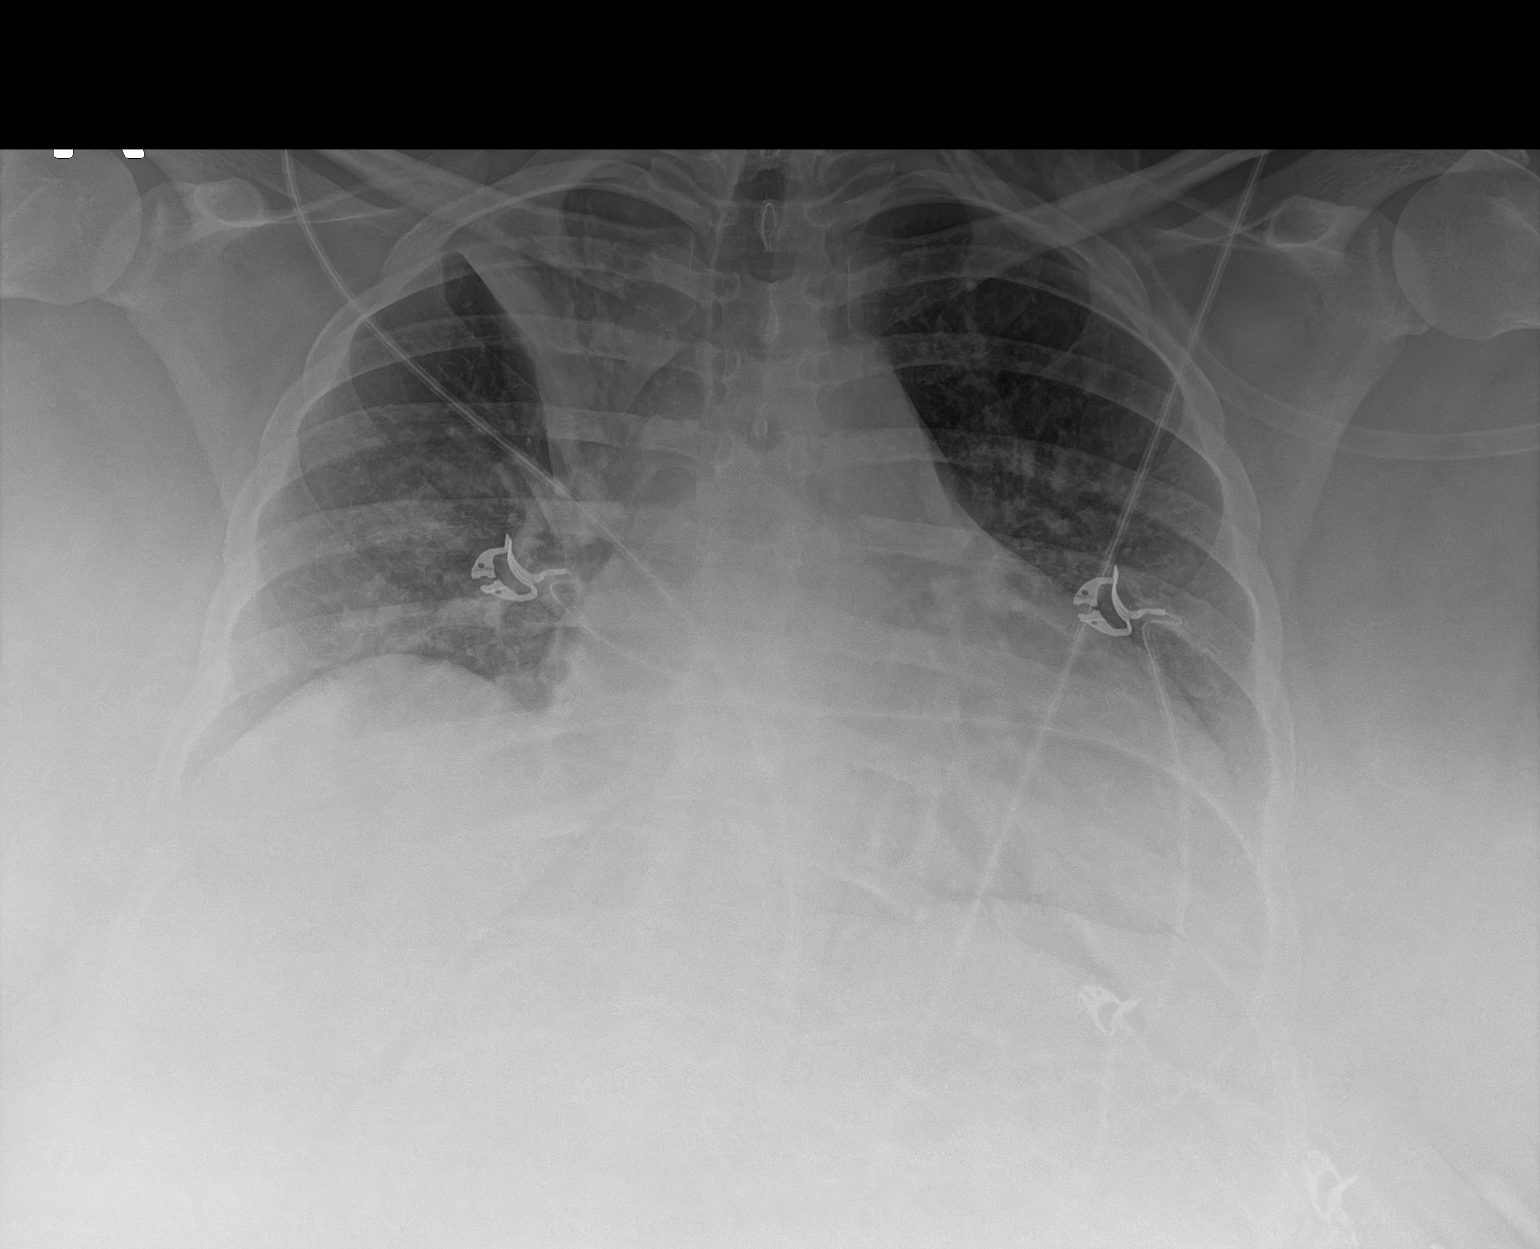

[1 of 1 positions shown; findings below may reference images not displayed]

FINDINGS: Right upper lobe atelectasis/collapse with volume loss in the right
hemithorax. Left lung is clear. No pleural effusion or pneumothorax.

The heart is top-normal in size.
IMPRESSION: Right upper lobe atelectasis/collapse with volume loss in the right
hemithorax. In the acute setting, this is likely related to
pneumonia.

## 2022-05-13 ENCOUNTER — Encounter (HOSPITAL_BASED_OUTPATIENT_CLINIC_OR_DEPARTMENT_OTHER): Payer: Self-pay | Admitting: Nurse Practitioner

## 2022-05-13 ENCOUNTER — Other Ambulatory Visit (HOSPITAL_BASED_OUTPATIENT_CLINIC_OR_DEPARTMENT_OTHER): Payer: Self-pay

## 2022-05-13 ENCOUNTER — Other Ambulatory Visit (HOSPITAL_COMMUNITY)
Admission: RE | Admit: 2022-05-13 | Discharge: 2022-05-13 | Disposition: A | Discharge status: Home / Self Care | Payer: BC Managed Care – PPO | Source: Ambulatory Visit | Attending: Nurse Practitioner | Admitting: Nurse Practitioner

## 2022-05-13 ENCOUNTER — Ambulatory Visit (INDEPENDENT_AMBULATORY_CARE_PROVIDER_SITE_OTHER): Payer: BC Managed Care – PPO | Admitting: Nurse Practitioner

## 2022-05-13 VITALS — BP 142/91 | HR 83 | Ht 71.0 in | Wt >= 6400 oz

## 2022-05-13 DIAGNOSIS — J4520 Mild intermittent asthma, uncomplicated: Secondary | ICD-10-CM

## 2022-05-13 DIAGNOSIS — Z0000 Encounter for general adult medical examination without abnormal findings: Secondary | ICD-10-CM | POA: Diagnosis not present

## 2022-05-13 DIAGNOSIS — I10 Essential (primary) hypertension: Secondary | ICD-10-CM | POA: Diagnosis not present

## 2022-05-13 DIAGNOSIS — E559 Vitamin D deficiency, unspecified: Secondary | ICD-10-CM | POA: Diagnosis not present

## 2022-05-13 DIAGNOSIS — B372 Candidiasis of skin and nail: Secondary | ICD-10-CM

## 2022-05-13 DIAGNOSIS — F324 Major depressive disorder, single episode, in partial remission: Secondary | ICD-10-CM

## 2022-05-13 DIAGNOSIS — E282 Polycystic ovarian syndrome: Secondary | ICD-10-CM | POA: Diagnosis not present

## 2022-05-13 MED ORDER — SPIRONOLACTONE 25 MG PO TABS
25.0000 mg | ORAL_TABLET | Freq: Every day | ORAL | 3 refills | Status: DC
  Filled 2022-05-13: qty 90, 90d supply, fill #0
  Filled 2022-08-12: qty 90, 90d supply, fill #1

## 2022-05-13 MED ORDER — BUPROPION HCL ER (SR) 150 MG PO TB12
150.0000 mg | ORAL_TABLET | Freq: Two times a day (BID) | ORAL | 3 refills | Status: DC
  Filled 2022-05-13: qty 90, 45d supply, fill #0
  Filled 2022-08-12: qty 90, 45d supply, fill #1

## 2022-05-13 MED ORDER — FLUCONAZOLE 150 MG PO TABS
ORAL_TABLET | ORAL | 2 refills | Status: DC
  Filled 2022-05-13: qty 5, 15d supply, fill #0
  Filled 2022-08-13: qty 5, 15d supply, fill #1

## 2022-05-13 MED ORDER — METFORMIN HCL 500 MG PO TABS
500.0000 mg | ORAL_TABLET | Freq: Every day | ORAL | 6 refills | Status: DC
  Filled 2022-05-13: qty 30, 30d supply, fill #0

## 2022-05-13 MED ORDER — VALSARTAN 40 MG PO TABS
40.0000 mg | ORAL_TABLET | Freq: Every day | ORAL | 3 refills | Status: DC
  Filled 2022-05-13 – 2022-08-12 (×2): qty 90, 90d supply, fill #0
  Filled 2022-11-25 (×2): qty 90, 90d supply, fill #1

## 2022-05-13 MED ORDER — ORTHO-NOVUM 1/35 (28) 1-35 MG-MCG PO TABS
1.0000 | ORAL_TABLET | Freq: Every day | ORAL | 3 refills | Status: AC
  Filled 2022-05-13: qty 84, 84d supply, fill #0
  Filled 2022-08-12: qty 84, 84d supply, fill #1
  Filled 2022-09-24 – 2022-11-25 (×2): qty 84, 84d supply, fill #2

## 2022-05-13 MED ORDER — ALBUTEROL SULFATE (2.5 MG/3ML) 0.083% IN NEBU
2.5000 mg | INHALATION_SOLUTION | RESPIRATORY_TRACT | 2 refills | Status: DC | PRN
  Filled 2022-05-13: qty 75, 12d supply, fill #0
  Filled 2022-08-12: qty 90, 5d supply, fill #1
  Filled 2022-09-24: qty 90, 5d supply, fill #2

## 2022-05-13 MED ORDER — VITAMIN D (ERGOCALCIFEROL) 1.25 MG (50000 UNIT) PO CAPS
50000.0000 [IU] | ORAL_CAPSULE | ORAL | 3 refills | Status: DC
Start: 2022-05-13 — End: 2022-10-13
  Filled 2022-05-13: qty 12, 84d supply, fill #0

## 2022-05-13 MED ORDER — ALBUTEROL SULFATE HFA 108 (90 BASE) MCG/ACT IN AERS
2.0000 | INHALATION_SPRAY | RESPIRATORY_TRACT | 6 refills | Status: DC | PRN
  Filled 2022-05-13: qty 6.7, 17d supply, fill #0
  Filled 2022-08-12: qty 6.7, 17d supply, fill #1
  Filled 2022-09-24: qty 6.7, 17d supply, fill #2

## 2022-05-13 NOTE — Patient Instructions (Signed)
I have sent in the medication for refills to the pharmacy downstairs.  I have also sent in the new medication for metformin for you to take for your PCOS and blood sugars.  Please let me know if you have any questions or concerns.  We will plan to recheck labs in about 6 months and see how you are doing.

## 2022-05-13 NOTE — Progress Notes (Signed)
Natalie Clamp, DNP, AGNP-c [facility] [address] [place], [place] [address] [phone] Office [phone] Fax  ESTABLISHED PATIENT- Chronic Health and/or Follow-Up Visit  Blood pressure (!) 142/91, pulse 83, height 5\' 11"  (1.803 m), weight (!) 417 lb 3.2 oz (189.2 kg), SpO2 100 %.  Follow-up (Pt her for f/u on bp and she also would like to get a pap done )   HPI  Natalie Ramsey  is a 31 y.o. year old female presenting today for evaluation and management of the following: Hypertension She reports that her blood pressure is not well controlled today as she has run out of her medication she has been out of medication for approximately 3 days at this time She denies palpitations, chest pain, shortness of breath, dizziness, vision changes, lower extremity edema She does feel that when she takes the medication it is working well for her  she does typically take the medication daily without missed doses She denies any side effects of the medication and feels it is working well for her. Pap smear Routine Pap smear Currently doing well on norethindrone-ethinyl estradiol 1/35 OCP Denies concerns for pregnancy or STI today Menstrual cycles regulated with OCP.  ROS All ROS negative with exception of what is listed in HPI  PHYSICAL EXAM Physical Exam  ASSESSMENT & PLAN Problem List Items Addressed This Visit     Asthma, chronic    Chronic intermittent asthma.  Currently well controlled.  We will send refill on albuterol inhaler today.  No alarm symptoms present at this time.      Relevant Medications   albuterol (VENTOLIN HFA) 108 (90 Base) MCG/ACT inhaler   albuterol (PROVENTIL) (2.5 MG/3ML) 0.083% nebulizer solution   PCOS (polycystic ovarian syndrome)    Chronic.  Menstrual cycles well controlled at this time with OCP.  We will send refills in for the next year today.  Patient also taking metformin and spironolactone with no  complications or concerns at this time.  Recent labs reviewed with no signs of potassium abnormality.  Continue current medication regimen and follow-up if symptoms worsen or fail to improve at any point.      Relevant Medications   spironolactone (ALDACTONE) 25 MG tablet   valsartan (DIOVAN) 40 MG tablet   norethindrone-ethinyl estradiol 1/35 (ORTHO-NOVUM 1/35, 28,) tablet   metFORMIN (GLUCOPHAGE) 500 MG tablet   Hypertension - Primary    Chronic.  Not well controlled at this time due to patient running out of medication several days ago.  Repeat blood pressure in the office remains elevated today.  She is asymptomatic with this.  At this time recommend immediately restart at home medications.  Refills have been provided today.  No alarm symptoms.  Encourage patient to contact the office immediately when she realizes her prescription is running low we will be happy to send refills if for her to avoid running out of medication future.  We will plan to follow-up in about 6 months or sooner if needed.      Relevant Medications   spironolactone (ALDACTONE) 25 MG tablet   valsartan (DIOVAN) 40 MG tablet   norethindrone-ethinyl estradiol 1/35 (ORTHO-NOVUM 1/35, 28,) tablet   buPROPion (WELLBUTRIN SR) 150 MG 12 hr tablet   albuterol (VENTOLIN HFA) 108 (90 Base) MCG/ACT inhaler   albuterol (PROVENTIL) (2.5 MG/3ML) 0.083% nebulizer solution   Candida infection of flexural skin    Repeat exacerbation of candidal infection of the skin folds.  Will retreat at  this time with oral Diflucan as this was helpful in relieving symptoms previously.  Recommend avoidance of tight fitting clothing and changing moist clothing quickly.  Monitor for symptoms that do not completely resolve with treatment.  Recommend decreased carbohydrate diet to help reduce risk of candidal infections in the setting of elevated blood sugar levels.      Relevant Medications   fluconazole (DIFLUCAN) 150 MG tablet   Depression, major,  single episode, in partial remission (HCC)    Chronic.  Currently well controlled with Wellbutrin.  No alarm symptoms present at this time.  We will send refills in today.  Patient will follow-up if symptoms worsen or fail to improve at any point.      Relevant Medications   buPROPion (WELLBUTRIN SR) 150 MG 12 hr tablet   Vitamin D deficiency    Chronic.  Recent labs do show vitamin D deficiency.  Recommend high-dose vitamin D for treatment at this time.  We will plan to recheck labs in the fall or sooner if needed.      Relevant Medications   Vitamin D, Ergocalciferol, (DRISDOL) 1.25 MG (50000 UNIT) CAPS capsule   Other Visit Diagnoses     Healthcare maintenance       Relevant Orders   Cervicovaginal ancillary only (Completed)        FOLLOW-UP Return in about 6 months (around 11/12/2022) for Follow-up.   Natalie Clamp, DNP, AGNP-c 05/13/2022 10:46 AM

## 2022-05-19 ENCOUNTER — Encounter (HOSPITAL_BASED_OUTPATIENT_CLINIC_OR_DEPARTMENT_OTHER): Payer: Self-pay | Admitting: Nurse Practitioner

## 2022-05-19 LAB — RESULTS CONSOLE HPV: CHL HPV: NEGATIVE

## 2022-05-19 LAB — CERVICOVAGINAL ANCILLARY ONLY
Chlamydia: NEGATIVE
Comment: NEGATIVE
Comment: NEGATIVE
Comment: NEGATIVE
Comment: NEGATIVE
Comment: NORMAL
HSV1: NEGATIVE
HSV2: NEGATIVE
High risk HPV: NEGATIVE
Neisseria Gonorrhea: NEGATIVE
Trichomonas: NEGATIVE

## 2022-05-19 LAB — HM PAP SMEAR: HM Pap smear: NEGATIVE

## 2022-05-19 LAB — OB RESULTS CONSOLE GC/CHLAMYDIA: Chlamydia: NEGATIVE

## 2022-05-27 DIAGNOSIS — E559 Vitamin D deficiency, unspecified: Secondary | ICD-10-CM | POA: Insufficient documentation

## 2022-05-27 DIAGNOSIS — F324 Major depressive disorder, single episode, in partial remission: Secondary | ICD-10-CM | POA: Insufficient documentation

## 2022-05-27 NOTE — Assessment & Plan Note (Signed)
Chronic intermittent asthma.  Currently well controlled.  We will send refill on albuterol inhaler today.  No alarm symptoms present at this time.

## 2022-05-27 NOTE — Assessment & Plan Note (Addendum)
Chronic.  Menstrual cycles well controlled at this time with OCP.  We will send refills in for the next year today.  Patient also taking metformin and spironolactone with no complications or concerns at this time.  Recent labs reviewed with no signs of potassium abnormality.  Continue current medication regimen and follow-up if symptoms worsen or fail to improve at any point.

## 2022-05-27 NOTE — Assessment & Plan Note (Signed)
Repeat exacerbation of candidal infection of the skin folds.  Will retreat at this time with oral Diflucan as this was helpful in relieving symptoms previously.  Recommend avoidance of tight fitting clothing and changing moist clothing quickly.  Monitor for symptoms that do not completely resolve with treatment.  Recommend decreased carbohydrate diet to help reduce risk of candidal infections in the setting of elevated blood sugar levels.

## 2022-05-27 NOTE — Assessment & Plan Note (Signed)
Chronic.  Currently well controlled with Wellbutrin.  No alarm symptoms present at this time.  We will send refills in today.  Patient will follow-up if symptoms worsen or fail to improve at any point.

## 2022-05-27 NOTE — Assessment & Plan Note (Signed)
Chronic.  Not well controlled at this time due to patient running out of medication several days ago.  Repeat blood pressure in the office remains elevated today.  She is asymptomatic with this.  At this time recommend immediately restart at home medications.  Refills have been provided today.  No alarm symptoms.  Encourage patient to contact the office immediately when she realizes her prescription is running low we will be happy to send refills if for her to avoid running out of medication future.  We will plan to follow-up in about 6 months or sooner if needed.

## 2022-05-27 NOTE — Assessment & Plan Note (Signed)
Chronic.  Recent labs do show vitamin D deficiency.  Recommend high-dose vitamin D for treatment at this time.  We will plan to recheck labs in the fall or sooner if needed.

## 2022-06-22 ENCOUNTER — Encounter (HOSPITAL_BASED_OUTPATIENT_CLINIC_OR_DEPARTMENT_OTHER): Payer: Self-pay | Admitting: Nurse Practitioner

## 2022-08-12 ENCOUNTER — Other Ambulatory Visit (HOSPITAL_COMMUNITY): Payer: Self-pay

## 2022-08-13 ENCOUNTER — Other Ambulatory Visit (HOSPITAL_COMMUNITY): Payer: Self-pay

## 2022-09-23 ENCOUNTER — Ambulatory Visit (HOSPITAL_BASED_OUTPATIENT_CLINIC_OR_DEPARTMENT_OTHER): Payer: BC Managed Care – PPO | Admitting: Nurse Practitioner

## 2022-09-24 ENCOUNTER — Other Ambulatory Visit (HOSPITAL_COMMUNITY): Payer: Self-pay

## 2022-09-24 ENCOUNTER — Other Ambulatory Visit (HOSPITAL_BASED_OUTPATIENT_CLINIC_OR_DEPARTMENT_OTHER): Payer: Self-pay | Admitting: Nurse Practitioner

## 2022-09-24 DIAGNOSIS — I10 Essential (primary) hypertension: Secondary | ICD-10-CM

## 2022-09-26 ENCOUNTER — Encounter (HOSPITAL_COMMUNITY): Payer: Self-pay

## 2022-09-26 ENCOUNTER — Other Ambulatory Visit (HOSPITAL_COMMUNITY): Payer: Self-pay

## 2022-09-26 MED ORDER — ALBUTEROL SULFATE (2.5 MG/3ML) 0.083% IN NEBU
2.5000 mg | INHALATION_SOLUTION | RESPIRATORY_TRACT | 2 refills | Status: AC | PRN
  Filled 2022-09-26: qty 90, 5d supply, fill #0
  Filled 2022-11-25: qty 90, 9d supply, fill #1
  Filled 2023-06-25: qty 90, 5d supply, fill #1

## 2022-09-29 ENCOUNTER — Encounter (HOSPITAL_COMMUNITY): Payer: Self-pay

## 2022-09-29 ENCOUNTER — Other Ambulatory Visit (HOSPITAL_COMMUNITY): Payer: Self-pay

## 2022-10-07 ENCOUNTER — Other Ambulatory Visit (HOSPITAL_COMMUNITY): Payer: Self-pay

## 2022-10-13 ENCOUNTER — Ambulatory Visit (INDEPENDENT_AMBULATORY_CARE_PROVIDER_SITE_OTHER): Payer: BC Managed Care – PPO | Admitting: Nurse Practitioner

## 2022-10-13 ENCOUNTER — Encounter (HOSPITAL_BASED_OUTPATIENT_CLINIC_OR_DEPARTMENT_OTHER): Payer: Self-pay | Admitting: Nurse Practitioner

## 2022-10-13 VITALS — BP 130/74 | Resp 12 | Ht 71.0 in | Wt >= 6400 oz

## 2022-10-13 DIAGNOSIS — N926 Irregular menstruation, unspecified: Secondary | ICD-10-CM | POA: Diagnosis not present

## 2022-10-13 DIAGNOSIS — E282 Polycystic ovarian syndrome: Secondary | ICD-10-CM | POA: Diagnosis not present

## 2022-10-13 DIAGNOSIS — J4520 Mild intermittent asthma, uncomplicated: Secondary | ICD-10-CM

## 2022-10-13 DIAGNOSIS — I10 Essential (primary) hypertension: Secondary | ICD-10-CM | POA: Diagnosis not present

## 2022-10-13 DIAGNOSIS — E559 Vitamin D deficiency, unspecified: Secondary | ICD-10-CM

## 2022-10-13 DIAGNOSIS — F324 Major depressive disorder, single episode, in partial remission: Secondary | ICD-10-CM

## 2022-10-13 DIAGNOSIS — G4733 Obstructive sleep apnea (adult) (pediatric): Secondary | ICD-10-CM

## 2022-10-13 DIAGNOSIS — R8290 Unspecified abnormal findings in urine: Secondary | ICD-10-CM

## 2022-10-13 DIAGNOSIS — E662 Morbid (severe) obesity with alveolar hypoventilation: Secondary | ICD-10-CM

## 2022-10-13 NOTE — Progress Notes (Signed)
Natalie Clamp, DNP, AGNP-c [facility] [address] [place], [place] [address] [phone] Office [phone] Fax  ESTABLISHED PATIENT- Chronic Health and/or Follow-Up Visit  Blood pressure 130/74, resp. rate 12, height 5\' 11"  (1.803 m), weight (!) 427 lb 3.2 oz (193.8 kg).    Natalie Ramsey is a 31 y.o. year old female presenting today for evaluation and management of the following: Hypertension and Diabetes  PCOS Natalie Ramsey tells me she started her cycle October 16 and stopped November 14. She tells me she did not miss any doses of her birth control prior to starting her cycle. She was having heavy cramping and medium sized clots during her cycle. The cramping and pain was severe and caused her to miss several days of work. She denies any chance of pregnancy at the time. She is not sure what brought on this sudden change, this was not about the same time she stopped the metformin. She has had her employer send over a form to allow her to return to work. We did discuss FMLA in the event this happens again and she will ask her HR team about this. She would like to know if there are alternatives to the metformin that may help.   Hypertension She is not checking her blood pressures at home, but tells me she is taking her medication daily without missed doses. She she has been having headaches lately and OTC migraine medication helps with these, she feels like these were related to her menstrual cycle. She has not had shortness of breath, dizziness, vision changes, or chest pains, or LE edema. She is not having any side effects of the medication.   Mood She tells me that her mood is not great. She prefers to be alone, but she feels like this may be related to her period ongoing for over a month. She has stopped the wellbutrin, she tells me that she felt this made her feel sick and once she stopped the nausea went away. She is not interested in  starting anything again at this time, but will keep this option open if she feels like her mood does not improve.    OSA She tells me that she is using her CPAP and she is sleeping very well with this. She tells me she does still have some tiredness in the day, but she is waking up Natalie Ramsey. She feels like her sleep has improved.   Malodorous Urine She tells me she has noticed an odor to her urine in the morning, with a "woodsy" smell. She tells me she needs to drink more water.   All ROS negative with exception of what is listed above.   PHYSICAL EXAM Physical Exam Vitals and nursing note reviewed.  Constitutional:      General: She is not in acute distress.    Appearance: Normal appearance.  HENT:     Head: Normocephalic.  Eyes:     Extraocular Movements: Extraocular movements intact.     Conjunctiva/sclera: Conjunctivae normal.     Pupils: Pupils are equal, round, and reactive to light.  Neck:     Vascular: No carotid bruit.  Cardiovascular:     Rate and Rhythm: Normal rate and regular rhythm.     Pulses: Normal pulses.     Heart sounds: Normal heart sounds. No murmur heard. Pulmonary:     Effort: Pulmonary effort is normal.     Breath sounds: Normal breath sounds. No wheezing.  Abdominal:  General: Bowel sounds are normal. There is no distension.     Palpations: Abdomen is soft.     Tenderness: There is no abdominal tenderness. There is no right CVA tenderness, left CVA tenderness or guarding.  Musculoskeletal:        General: Normal range of motion.     Cervical back: Normal range of motion and neck supple.     Right lower leg: No edema.     Left lower leg: No edema.  Lymphadenopathy:     Cervical: No cervical adenopathy.  Skin:    General: Skin is warm and dry.     Capillary Refill: Capillary refill takes less than 2 seconds.  Neurological:     General: No focal deficit present.     Mental Status: She is alert and oriented to person, place, and time.   Psychiatric:        Mood and Affect: Mood normal.        Behavior: Behavior normal.        Thought Content: Thought content normal.        Judgment: Judgment normal.     PLAN Problem List Items Addressed This Visit     PCOS (polycystic ovarian syndrome)    Chronic. Menses irregular recently. Continue current OCP and contact the office if irregularities or prolonged bleeding occur again. We can prescribe additional treatment to help with management.       Relevant Orders   CBC with Differential/Platelet (Completed)   Comprehensive metabolic panel (Completed)   Hemoglobin A1c (Completed)   Lipid panel (Completed)   TSH (Completed)   POCT UA - Microalbumin   POCT URINALYSIS DIP (CLINITEK)   Depression, major, single episode, in partial remission (HCC)    Chronic. No alarm sx present at this time. Unfortunately medication was not helpful for her and she has stopped this. She is not wanting to restart anything right now, but I have encouraged her to reach out to me if she changes her mind. I have encouraged journaling and positive feedback training to help with mood. I would like her to consider counseling as this could also be very helpful.       Relevant Orders   CBC with Differential/Platelet (Completed)   Comprehensive metabolic panel (Completed)   Hemoglobin A1c (Completed)   Lipid panel (Completed)   TSH (Completed)   Vitamin D deficiency    Monitoring labs today      Relevant Orders   CBC with Differential/Platelet (Completed)   Comprehensive metabolic panel (Completed)   Hemoglobin A1c (Completed)   Lipid panel (Completed)   TSH (Completed)   Obesity hypoventilation syndrome (HCC)    Chronic. Doing very well with CPAP. No changes at this time.       Asthma, chronic    Chronic. Currently well controlled on albuterol and dulera. No alarm symptoms present at this time. No recent exacerbations. Continue current medications and monitoring. If breathing difficulty  presents, patient is aware to present to the ED if she is unable to control with her rescue inhaler. F/U in 6 months or sooner if needed.       Relevant Orders   CBC with Differential/Platelet (Completed)   Comprehensive metabolic panel (Completed)   Hemoglobin A1c (Completed)   Lipid panel (Completed)   TSH (Completed)   Hypertension - Primary    Chronic.  No alarm symptoms present at this time.  Goal blood pressure less than less than 130/80.  Refills have been provided today.  Labs  today to check electrolytes and kidney function.  Recommend current treatment plan is effective, no change in therapy, orders and follow up as documented in EpicCare, reviewed diet, exercise and weight control, labs ordered and recent results reviewed with patient. Currently is not followed with cardiology. We will make changes to plan of care as necessary based on lab results. Plan to follow-up in 40months.       Relevant Orders   CBC with Differential/Platelet (Completed)   Comprehensive metabolic panel (Completed)   Hemoglobin A1c (Completed)   Lipid panel (Completed)   TSH (Completed)   Other Visit Diagnoses     Irregular periods       Relevant Orders   CBC with Differential/Platelet (Completed)   Comprehensive metabolic panel (Completed)   Hemoglobin A1c (Completed)   Lipid panel (Completed)   TSH (Completed)   OSA (obstructive sleep apnea)       Relevant Orders   CBC with Differential/Platelet (Completed)   Comprehensive metabolic panel (Completed)   Hemoglobin A1c (Completed)   Lipid panel (Completed)   TSH (Completed)   Malodorous urine       Relevant Orders   POCT UA - Microalbumin   POCT URINALYSIS DIP (CLINITEK)       Return in about 3 months (around 01/13/2023) for OSA, HTN, HLD, Asthma F/U (45)- [place].   Natalie Clamp, DNP, AGNP-c

## 2022-10-13 NOTE — Assessment & Plan Note (Addendum)
Chronic. Menses irregular recently. Continue current OCP and contact the office if irregularities or prolonged bleeding occur again. We can prescribe additional treatment to help with management.

## 2022-10-13 NOTE — Patient Instructions (Addendum)
My new office is [facility] located at [address] [employer]. Their number is [phone]. You can schedule your next appointment by calling them at your earliest convenience.   If your period comes back on and is lasting more than 5-7 days, please send me a message and let me know and we can increase your medication temporarily to stop it.   I will be in touch with you about your labs.  I will see what other medications may be an option based on the results.   I will get the form and fill this out for you. If you feel like you need FMLA for your heavy periods, ask your HR department to send this paperwork and we will get this filled out for you to protect your job.   Keep using your CPAP.  Let me know if your mood does not improve now that you are off of your cycle, please let me know.

## 2022-10-13 NOTE — Assessment & Plan Note (Signed)
Chronic.  No alarm symptoms present at this time.  Goal blood pressure less than less than 130/80.  Refills have been provided today.  Labs today to check electrolytes and kidney function.  Recommend current treatment plan is effective, no change in therapy, orders and follow up as documented in EpicCare, reviewed diet, exercise and weight control, labs ordered and recent results reviewed with patient. Currently is not followed with cardiology. We will make changes to plan of care as necessary based on lab results. Plan to follow-up in 6months.  

## 2022-10-13 NOTE — Assessment & Plan Note (Signed)
Chronic. Currently well controlled on albuterol and dulera. No alarm symptoms present at this time. No recent exacerbations. Continue current medications and monitoring. If breathing difficulty presents, patient is aware to present to the ED if she is unable to control with her rescue inhaler. F/U in 6 months or sooner if needed.

## 2022-10-14 LAB — COMPREHENSIVE METABOLIC PANEL
ALT: 30 IU/L (ref 0–32)
AST: 16 IU/L (ref 0–40)
Albumin/Globulin Ratio: 1.3 (ref 1.2–2.2)
Albumin: 3.9 g/dL (ref 3.9–4.9)
Alkaline Phosphatase: 85 IU/L (ref 44–121)
BUN/Creatinine Ratio: 11 (ref 9–23)
BUN: 10 mg/dL (ref 6–20)
Bilirubin Total: 0.3 mg/dL (ref 0.0–1.2)
CO2: 24 mmol/L (ref 20–29)
Calcium: 8.6 mg/dL — ABNORMAL LOW (ref 8.7–10.2)
Chloride: 101 mmol/L (ref 96–106)
Creatinine, Ser: 0.87 mg/dL (ref 0.57–1.00)
Globulin, Total: 2.9 g/dL (ref 1.5–4.5)
Glucose: 128 mg/dL — ABNORMAL HIGH (ref 70–99)
Potassium: 4.1 mmol/L (ref 3.5–5.2)
Sodium: 139 mmol/L (ref 134–144)
Total Protein: 6.8 g/dL (ref 6.0–8.5)
eGFR: 91 mL/min/{1.73_m2} (ref >59)

## 2022-10-14 LAB — CBC WITH DIFFERENTIAL/PLATELET
Basophils Absolute: 0 10*3/uL (ref 0.0–0.2)
Basos: 0 %
EOS (ABSOLUTE): 0.2 10*3/uL (ref 0.0–0.4)
Eos: 2 %
Hematocrit: 37.4 % (ref 34.0–46.6)
Hemoglobin: 11.5 g/dL (ref 11.1–15.9)
Immature Grans (Abs): 0 10*3/uL (ref 0.0–0.1)
Immature Granulocytes: 0 %
Lymphocytes Absolute: 1.7 10*3/uL (ref 0.7–3.1)
Lymphs: 21 %
MCH: 26.3 pg — ABNORMAL LOW (ref 26.6–33.0)
MCHC: 30.7 g/dL — ABNORMAL LOW (ref 31.5–35.7)
MCV: 86 fL (ref 79–97)
Monocytes Absolute: 0.4 10*3/uL (ref 0.1–0.9)
Monocytes: 5 %
Neutrophils Absolute: 5.6 10*3/uL (ref 1.4–7.0)
Neutrophils: 72 %
Platelets: 406 10*3/uL (ref 150–450)
RBC: 4.37 x10E6/uL (ref 3.77–5.28)
RDW: 14.1 % (ref 11.7–15.4)
WBC: 7.8 10*3/uL (ref 3.4–10.8)

## 2022-10-14 LAB — TSH: TSH: 2.49 u[IU]/mL (ref 0.450–4.500)

## 2022-10-14 LAB — LIPID PANEL
Chol/HDL Ratio: 3.5 ratio (ref 0.0–4.4)
Cholesterol, Total: 184 mg/dL (ref 100–199)
HDL: 52 mg/dL (ref >39)
LDL Chol Calc (NIH): 118 mg/dL — ABNORMAL HIGH (ref 0–99)
Triglycerides: 77 mg/dL (ref 0–149)
VLDL Cholesterol Cal: 14 mg/dL (ref 5–40)

## 2022-10-14 LAB — HEMOGLOBIN A1C
Est. average glucose Bld gHb Est-mCnc: 117 mg/dL
Hgb A1c MFr Bld: 5.7 % — ABNORMAL HIGH (ref 4.8–5.6)

## 2022-10-19 ENCOUNTER — Telehealth: Payer: Self-pay | Admitting: Nurse Practitioner

## 2022-10-19 NOTE — Telephone Encounter (Signed)
Pt called a form from her employer that was sent to you at previous office to be completed. Her employer has not received it, she will have them fax them form over to our office. I advised patient that you would be back in office on Tuesday

## 2022-10-25 NOTE — Telephone Encounter (Signed)
Pt called in again about a form that needs to be faxed to her employer. She states they need it faxed today if possible. The form was put in your purple folder, and I did let her know this is your first day back from being off and that you are with patients but I would send another message for a reminder.

## 2022-10-26 NOTE — Telephone Encounter (Signed)
Form completed and faxed 10/26/2022 at 8:00 am.

## 2022-11-11 ENCOUNTER — Ambulatory Visit (HOSPITAL_BASED_OUTPATIENT_CLINIC_OR_DEPARTMENT_OTHER): Payer: BC Managed Care – PPO | Admitting: Nurse Practitioner

## 2022-11-25 ENCOUNTER — Other Ambulatory Visit (HOSPITAL_COMMUNITY): Payer: Self-pay

## 2022-11-25 ENCOUNTER — Other Ambulatory Visit: Payer: Self-pay

## 2022-11-26 ENCOUNTER — Other Ambulatory Visit (HOSPITAL_COMMUNITY): Payer: Self-pay

## 2022-11-27 NOTE — Assessment & Plan Note (Signed)
Monitoring labs today

## 2022-11-27 NOTE — Assessment & Plan Note (Addendum)
Chronic. No alarm sx present at this time. Unfortunately medication was not helpful for her and she has stopped this. She is not wanting to restart anything right now, but I have encouraged her to reach out to me if she changes her mind. I have encouraged journaling and positive feedback training to help with mood. I would like her to consider counseling as this could also be very helpful.

## 2022-11-27 NOTE — Assessment & Plan Note (Signed)
Chronic. Doing very well with CPAP. No changes at this time.

## 2022-12-08 ENCOUNTER — Other Ambulatory Visit: Payer: Self-pay

## 2022-12-09 ENCOUNTER — Encounter (HOSPITAL_COMMUNITY): Payer: Self-pay

## 2022-12-09 ENCOUNTER — Ambulatory Visit (HOSPITAL_COMMUNITY)
Admission: EM | Admit: 2022-12-09 | Discharge: 2022-12-09 | Disposition: A | Discharge status: Home / Self Care | Payer: BC Managed Care – PPO

## 2022-12-09 DIAGNOSIS — K0889 Other specified disorders of teeth and supporting structures: Secondary | ICD-10-CM | POA: Diagnosis not present

## 2022-12-09 MED ORDER — LIDOCAINE VISCOUS HCL 2 % MT SOLN: 15.0000 mL | OROMUCOSAL | 0 refills | Status: DC | PRN

## 2022-12-09 NOTE — Discharge Instructions (Signed)
I have prescribed a lidocaine solution that you may apply directly to area or swish and spit to help alleviate discomfort.  Please follow-up with dentist for further evaluation and management.

## 2022-12-09 NOTE — ED Triage Notes (Signed)
Pt states was at the dentist 2 days ago to have a tooth removed. States they were unable to get it out and cut her gums. Pt c/o pain to lt upper teeth. States they gave her pain meds and antibiotics with no relief.

## 2022-12-09 NOTE — ED Provider Notes (Signed)
[facility]    CSN: [id number] Arrival date & time: 12/09/22  1800      History   Chief Complaint Chief Complaint  Patient presents with   Dental Pain    HPI Natalie Ramsey is a 32 y.o. female.   Patient presents with left upper dental pain that started today.  Patient reports that she had a dental procedure today where one of her teeth was attempted to be extracted.  Patient reports that the dentist was not able to extract it so she has to go back to be put under anesthesia to have it completed.  She reports that she has been having significant pain since the procedure.  She was prescribed tramadol, penicillin antibiotic, 600 mg ibuprofen.  She last took these medications approximately 2 hours ago with minimal improvement.   Dental Pain   Past Medical History:  Diagnosis Date   Asthma    Asthma    Interstitial pneumonitis ([place]) 12/25/2013   Pyelonephritis     Patient Active Problem List   Diagnosis Date Noted   Obesity hypoventilation syndrome ([facility]) 10/13/2022   Depression, major, single episode, in partial remission ([place]) 05/27/2022   Vitamin D deficiency 05/27/2022   PCOS (polycystic ovarian syndrome) 02/11/2022   Abdominal bloating associated with menstruation 02/11/2022   Hypertension 02/11/2022   Candida infection of flexural skin 02/11/2022   Asthma, chronic 12/24/2013    History reviewed. No pertinent surgical history.  OB History     Gravida  0   Para  0   Term  0   Preterm  0   AB  0   Living  0      SAB  0   IAB  0   Ectopic  0   Multiple  0   Live Births  0            Home Medications    Prior to Admission medications   Medication Sig Start Date End Date Taking? Authorizing Provider  amoxicillin (AMOXIL) 875 MG tablet SMARTSIG:1 Tablet(s) By Mouth 12/06/22  Yes [provider]  ibuprofen (ADVIL) 600 MG tablet Take 600 mg by mouth every 6 (six) hours as needed.   Yes [provider]  lidocaine  (XYLOCAINE) 2 % solution Use as directed 15 mLs in the mouth or throat as needed for mouth pain. Swish and spit or apply directly to area 12/09/22  Yes [place], [place] E, FNP  penicillin v potassium (VEETID) 500 MG tablet Take 500 mg by mouth 4 (four) times daily. 12/08/22  Yes [provider]  traMADol (ULTRAM) 50 MG tablet Take 50 mg by mouth every 6 (six) hours as needed. 12/08/22  Yes [provider]  albuterol (PROVENTIL) (2.5 MG/3ML) 0.083% nebulizer solution Inhale 1 vial by nebulization every 4 (four) hours as needed for wheezing or shortness of breath. 09/26/22   Orma Render, NP  mometasone-formoterol (DULERA) 200-5 MCG/ACT AERO Inhale 2 puffs into the lungs 2 (two) times daily. 12/16/21 01/15/22  Carvel Getting, NP  norethindrone-ethinyl estradiol 1/35 ([place] 1/35, 28,) tablet Take 1 tablet by mouth daily. 05/13/22   Early, Coralee Pesa, NP  ondansetron (ZOFRAN ODT) 4 MG disintegrating tablet Take 1 tablet (4 mg total) by mouth every 8 (eight) hours as needed for nausea or vomiting. 09/24/21   Loni Beckwith, PA-C  valsartan (DIOVAN) 40 MG tablet Take 1 tablet (40 mg total) by mouth daily. 05/13/22   Orma Render, NP  Norethindrone Acetate-Ethinyl  Estrad-FE (LOESTRIN 24 FE) 1-20 MG-MCG(24) tablet Take 1 tablet by mouth daily. Patient not taking: Reported on 03/17/2020 12/13/19 12/06/20  Willodean Rosenthal, MD    Family History History reviewed. No pertinent family history.  Social History Social History   Tobacco Use   Smoking status: Some Days    Packs/day: 0.50    Types: Cigarettes   Smokeless tobacco: Never  Vaping Use   Vaping Use: Never used  Substance Use Topics   Alcohol use: Not Currently    Comment: occ   Drug use: No     Allergies   Ceftriaxone, Levaquin [levofloxacin in d5w], and Zosyn [piperacillin sod-tazobactam so]   Review of Systems Review of Systems Per HPI  Physical Exam Triage Vital Signs ED Triage Vitals [12/09/22 1922]  Enc  Vitals Group     BP (!) 152/94     Pulse Rate 90     Resp 18     Temp 98.7 F (37.1 C)     Temp Source Oral     SpO2 95 %     Weight      Height      Head Circumference      Peak Flow      Pain Score 10     Pain Loc      Pain Edu?      Excl. in GC?    No data found.  Updated Vital Signs BP (!) 152/94 (BP Location: Left Arm)   Pulse 90   Temp 98.7 F (37.1 C) (Oral)   Resp 18   LMP 11/21/2022   SpO2 95%   Visual Acuity Right Eye Distance:   Left Eye Distance:   Bilateral Distance:    Right Eye Near:   Left Eye Near:    Bilateral Near:     Physical Exam Constitutional:      General: She is not in acute distress.    Appearance: Normal appearance. She is not toxic-appearing or diaphoretic.  HENT:     Head: Normocephalic and atraumatic.     Mouth/Throat:     Dentition: Dental tenderness present.     Comments: Patient has dried blood surrounding left upper posterior tooth.  Mild swelling noted but no signs of infection.  No active bleeding. Eyes:     Extraocular Movements: Extraocular movements intact.     Conjunctiva/sclera: Conjunctivae normal.  Pulmonary:     Effort: Pulmonary effort is normal.  Neurological:     General: No focal deficit present.     Mental Status: She is alert and oriented to person, place, and time. Mental status is at baseline.  Psychiatric:        Mood and Affect: Mood normal.        Behavior: Behavior normal.        Thought Content: Thought content normal.        Judgment: Judgment normal.      UC Treatments / Results  Labs (all labs ordered are listed, but only abnormal results are displayed) Labs Reviewed - No data to display  EKG   Radiology No results found.  Procedures Procedures (including critical care time)  Medications Ordered in UC Medications - No data to display  Initial Impression / Assessment and Plan / UC Course  I have reviewed the triage vital signs and the nursing notes.  Pertinent labs & imaging  results that were available during my care of the patient were reviewed by me and considered in my medical decision making (see  chart for details).     Patient having persistent dental pain since dental procedure that was performed today.  There is no active bleeding or signs of infection on exam.  Patient prescribed penicillin antibiotic, 600 mg ibuprofen, tramadol today.  Patient has taken these medications today approximately 2 hours prior to arrival with minimal improvement.  Discussed with patient limited options on pain management here in urgent care.  Will avoid Toradol given patient recently took ibuprofen.  Will prescribe viscous lidocaine for patient to apply directly to area to see if this helps alleviate discomfort.  Advised patient to follow-up with dentist for further evaluation and management.  Discussed return precautions.  Patient verbalized understanding and was agreeable with plan. Final Clinical Impressions(s) / UC Diagnoses   Final diagnoses:  Pain, dental     Discharge Instructions      I have prescribed a lidocaine solution that you may apply directly to area or swish and spit to help alleviate discomfort.  Please follow-up with dentist for further evaluation and management.    ED Prescriptions     Medication Sig Dispense Auth. Provider   lidocaine (XYLOCAINE) 2 % solution Use as directed 15 mLs in the mouth or throat as needed for mouth pain. Swish and spit or apply directly to area 100 mL Teodora Medici, [place]      PDMP not reviewed this encounter.   Teodora Medici, [place] 12/09/22 1941

## 2023-04-07 ENCOUNTER — Encounter: Payer: Self-pay | Admitting: Nurse Practitioner

## 2023-06-25 ENCOUNTER — Other Ambulatory Visit: Payer: Self-pay | Admitting: Emergency Medicine

## 2023-06-25 ENCOUNTER — Other Ambulatory Visit (HOSPITAL_BASED_OUTPATIENT_CLINIC_OR_DEPARTMENT_OTHER): Payer: Self-pay | Admitting: Nurse Practitioner

## 2023-06-25 DIAGNOSIS — I10 Essential (primary) hypertension: Secondary | ICD-10-CM

## 2023-06-25 DIAGNOSIS — E282 Polycystic ovarian syndrome: Secondary | ICD-10-CM

## 2023-06-26 ENCOUNTER — Other Ambulatory Visit (HOSPITAL_COMMUNITY): Payer: Self-pay

## 2023-06-26 MED ORDER — VALSARTAN 40 MG PO TABS
40.0000 mg | ORAL_TABLET | Freq: Every day | ORAL | 0 refills | Status: AC
  Filled 2023-06-26: qty 30, 30d supply, fill #0

## 2023-06-26 MED ORDER — MOMETASONE FURO-FORMOTEROL FUM 200-5 MCG/ACT IN AERO
2.0000 | INHALATION_SPRAY | Freq: Two times a day (BID) | RESPIRATORY_TRACT | 0 refills | Status: AC
  Filled 2023-06-26: qty 13, 30d supply, fill #0

## 2023-06-27 ENCOUNTER — Other Ambulatory Visit: Payer: Self-pay

## 2023-06-27 ENCOUNTER — Other Ambulatory Visit (HOSPITAL_COMMUNITY): Payer: Self-pay

## 2023-06-28 ENCOUNTER — Other Ambulatory Visit: Payer: Self-pay

## 2023-07-05 ENCOUNTER — Other Ambulatory Visit (HOSPITAL_COMMUNITY): Payer: Self-pay

## 2023-07-06 ENCOUNTER — Encounter: Payer: Self-pay | Admitting: Nurse Practitioner

## 2024-12-11 ENCOUNTER — Encounter (HOSPITAL_BASED_OUTPATIENT_CLINIC_OR_DEPARTMENT_OTHER): Payer: Self-pay | Admitting: Emergency Medicine

## 2024-12-11 ENCOUNTER — Emergency Department (HOSPITAL_BASED_OUTPATIENT_CLINIC_OR_DEPARTMENT_OTHER)
Admission: EM | Admit: 2024-12-11 | Discharge: 2024-12-12 | Disposition: A | Discharge status: Home / Self Care | Payer: PRIVATE HEALTH INSURANCE | Attending: Emergency Medicine | Admitting: Emergency Medicine

## 2024-12-11 ENCOUNTER — Other Ambulatory Visit: Payer: Self-pay

## 2024-12-11 DIAGNOSIS — R1110 Vomiting, unspecified: Secondary | ICD-10-CM | POA: Diagnosis present

## 2024-12-11 DIAGNOSIS — K529 Noninfective gastroenteritis and colitis, unspecified: Secondary | ICD-10-CM | POA: Diagnosis not present

## 2024-12-11 LAB — COMPREHENSIVE METABOLIC PANEL WITH GFR
ALT: 11 U/L (ref 0–44)
AST: 17 U/L (ref 15–41)
Albumin: 3.8 g/dL (ref 3.5–5.0)
Alkaline Phosphatase: 73 U/L (ref 38–126)
Anion gap: 11 (ref 5–15)
BUN: 8 mg/dL (ref 6–20)
CO2: 27 mmol/L (ref 22–32)
Calcium: 8.6 mg/dL — ABNORMAL LOW (ref 8.9–10.3)
Chloride: 100 mmol/L (ref 98–111)
Creatinine, Ser: 0.86 mg/dL (ref 0.44–1.00)
GFR, Estimated: >60 mL/min
Glucose, Bld: 98 mg/dL (ref 70–99)
Potassium: 3.7 mmol/L (ref 3.5–5.1)
Sodium: 137 mmol/L (ref 135–145)
Total Bilirubin: 0.4 mg/dL (ref 0.0–1.2)
Total Protein: 7.5 g/dL (ref 6.5–8.1)

## 2024-12-11 LAB — CBC
HCT: 38.9 % (ref 36.0–46.0)
Hemoglobin: 12 g/dL (ref 12.0–15.0)
MCH: 25.4 pg — ABNORMAL LOW (ref 26.0–34.0)
MCHC: 30.8 g/dL (ref 30.0–36.0)
MCV: 82.4 fL (ref 80.0–100.0)
Platelets: 383 K/uL (ref 150–400)
RBC: 4.72 MIL/uL (ref 3.87–5.11)
RDW: 16 % — ABNORMAL HIGH (ref 11.5–15.5)
WBC: 5.8 K/uL (ref 4.0–10.5)
nRBC: 0 % (ref 0.0–0.2)

## 2024-12-11 LAB — LIPASE, BLOOD: Lipase: 30 U/L (ref 11–51)

## 2024-12-11 MED ORDER — ONDANSETRON HCL 4 MG/2ML IJ SOLN
4.0000 mg | Freq: Once | INTRAMUSCULAR | Status: AC
  Administered 2024-12-11: 4 mg via INTRAVENOUS
  Filled 2024-12-11: qty 2

## 2024-12-11 NOTE — ED Triage Notes (Signed)
 Patient c/o n/v/d after eating [employer] last night, patient's god son has similar symptoms.

## 2024-12-12 MED ORDER — ONDANSETRON 4 MG PO TBDP: 4.0000 mg | ORAL_TABLET | Freq: Three times a day (TID) | ORAL | 0 refills | Status: AC | PRN

## 2024-12-12 NOTE — ED Provider Notes (Signed)
 "  [facility]  Provider Note  CSN: [id number] Arrival date & time: 12/11/24 2118  History Chief Complaint  Patient presents with   Vomiting   Diarrhea    Natalie Ramsey is a 34 y.o. female here for 1 day of vomiting and diarrhea. Vomiting started last night, has improved some during the day but then diarrhea started this afternoon. Some abdominal discomfort but tolerating PO fluids while in the waiting room. No fever. No blood. No urinary symptoms.    Home Medications Prior to Admission medications  Medication Sig Start Date End Date Taking? Authorizing Provider  albuterol  (PROVENTIL ) (2.5 MG/3ML) 0.083% nebulizer solution Inhale 1 vial by nebulization every 4 (four) hours as needed for wheezing or shortness of breath. 09/26/22   Early, Sara E, NP  ibuprofen  (ADVIL ) 600 MG tablet Take 600 mg by mouth every 6 (six) hours as needed.    [provider]  mometasone -formoterol  (DULERA ) 200-5 MCG/ACT AERO Inhale 2 puffs into the lungs 2 (two) times daily. 06/26/23 07/26/23  Early, Sara E, NP  norethindrone -ethinyl estradiol  1/35 (ORTHO-NOVUM  1/35, 28,) tablet Take 1 tablet by mouth daily. 05/13/22   Early, Sara E, NP  ondansetron  (ZOFRAN  ODT) 4 MG disintegrating tablet Take 1 tablet (4 mg total) by mouth every 8 (eight) hours as needed for nausea or vomiting. 12/12/24   Roselyn Carlin NOVAK, MD  traMADol (ULTRAM) 50 MG tablet Take 50 mg by mouth every 6 (six) hours as needed. 12/08/22   [provider]  valsartan  (DIOVAN ) 40 MG tablet Take 1 tablet (40 mg total) by mouth daily. 06/26/23   Early, Sara E, NP  Norethindrone  Acetate-Ethinyl Estrad-FE (LOESTRIN  24 FE) 1-20 MG-MCG(24) tablet Take 1 tablet by mouth daily. Patient not taking: Reported on 03/17/2020 12/13/19 12/06/20  Corene Coy, MD     Allergies    Ceftriaxone , Levaquin  [levofloxacin  in d5w], and Zosyn  [piperacillin  sod-tazobactam so]   Review of Systems   Review of  Systems Please see HPI for pertinent positives and negatives  Physical Exam BP 139/84   Pulse 86   Temp 99.2 F (37.3 C)   Resp 18   Ht 5' 11 (1.803 m)   Wt (!) 188.7 kg   SpO2 97%   BMI 58.02 kg/m   Physical Exam Vitals and nursing note reviewed.  Constitutional:      Appearance: Normal appearance.  HENT:     Head: Normocephalic and atraumatic.     Nose: Nose normal.     Mouth/Throat:     Mouth: Mucous membranes are moist.  Eyes:     Extraocular Movements: Extraocular movements intact.     Conjunctiva/sclera: Conjunctivae normal.  Cardiovascular:     Rate and Rhythm: Normal rate.  Pulmonary:     Effort: Pulmonary effort is normal.     Breath sounds: Normal breath sounds.  Abdominal:     General: Abdomen is flat.     Palpations: Abdomen is soft.     Tenderness: There is no abdominal tenderness. There is no guarding.  Musculoskeletal:        General: No swelling. Normal range of motion.     Cervical back: Neck supple.  Skin:    General: Skin is warm and dry.  Neurological:     General: No focal deficit present.     Mental Status: She is alert.  Psychiatric:        Mood and Affect: Mood normal.     ED Results / Procedures /  Treatments   EKG None  Procedures Procedures  Medications Ordered in the ED Medications  ondansetron  (ZOFRAN ) injection 4 mg (4 mg Intravenous Given 12/11/24 2153)    Initial Impression and Plan  Patient here with 24 hours of N/V/D. Vitals and exam are reassuring. Vomiting has resolved today but still having some diarrhea. Labs done in triage show unremarkable CBC, CMP and lipase. She is tolerating PO fluids and is comfortable managing her symptoms at home. Rx for Zofran  if needed, advance diet as tolerated. PCP follow up, RTED for any other concerns.    ED Course       MDM Rules/Calculators/A&P Medical Decision Making Problems Addressed: Gastroenteritis: acute illness or injury  Amount and/or Complexity of Data  Reviewed Labs: ordered. Decision-making details documented in ED Course.  Risk Prescription drug management.     Final Clinical Impression(s) / ED Diagnoses Final diagnoses:  Gastroenteritis    Rx / DC Orders ED Discharge Orders          Ordered    ondansetron  (ZOFRAN  ODT) 4 MG disintegrating tablet  Every 8 hours PRN        12/12/24 0038             Roselyn Carlin NOVAK, MD 12/12/24 [id number]  "

## 2025-02-21 ENCOUNTER — Encounter: Payer: PRIVATE HEALTH INSURANCE | Admitting: Student

## 2025-03-03 ENCOUNTER — Encounter: Payer: PRIVATE HEALTH INSURANCE | Admitting: Student

## 2025-03-10 ENCOUNTER — Encounter: Payer: PRIVATE HEALTH INSURANCE | Admitting: Student
